# Patient Record
Sex: Female | Born: 2000 | Race: Black or African American | Hispanic: No | Marital: Single | Smoking: Never smoker
Health system: Southern US, Community
[De-identification: ages and names within clinical notes are randomized; demographics above are authoritative.]

## PROBLEM LIST (undated history)

## (undated) ENCOUNTER — Inpatient Hospital Stay (HOSPITAL_COMMUNITY): Payer: Self-pay

## (undated) DIAGNOSIS — K219 Gastro-esophageal reflux disease without esophagitis: Secondary | ICD-10-CM

## (undated) DIAGNOSIS — Z789 Other specified health status: Secondary | ICD-10-CM

## (undated) HISTORY — PX: NO PAST SURGERIES: SHX2092

## (undated) HISTORY — DX: Gastro-esophageal reflux disease without esophagitis: K21.9

---
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
## 2018-01-11 ENCOUNTER — Other Ambulatory Visit: Payer: Self-pay

## 2018-01-11 ENCOUNTER — Encounter: Payer: Self-pay | Admitting: Family Medicine

## 2018-01-11 ENCOUNTER — Other Ambulatory Visit (HOSPITAL_COMMUNITY)
Admission: RE | Admit: 2018-01-11 | Discharge: 2018-01-11 | Disposition: A | Discharge status: Home / Self Care | Payer: Medicaid Other | Source: Ambulatory Visit | Attending: Family Medicine | Admitting: Family Medicine

## 2018-01-11 ENCOUNTER — Ambulatory Visit (INDEPENDENT_AMBULATORY_CARE_PROVIDER_SITE_OTHER): Payer: Self-pay | Admitting: Family Medicine

## 2018-01-11 VITALS — BP 120/80 | HR 69 | Temp 98.0°F | Wt 213.4 lb

## 2018-01-11 DIAGNOSIS — R1030 Lower abdominal pain, unspecified: Secondary | ICD-10-CM

## 2018-01-11 DIAGNOSIS — N926 Irregular menstruation, unspecified: Secondary | ICD-10-CM

## 2018-01-11 LAB — POCT URINE PREGNANCY: Preg Test, Ur: NEGATIVE

## 2018-01-11 NOTE — Patient Instructions (Addendum)
-   Avoid juices juices and sodas  - Try smaller more  - Keep a diary of the food you eat and your symptoms  Write down everything you eat  Then write down the symptoms  It was wonderful to see you today.  Thank you for choosing [facility].   Please call [phone] with any questions about today's appointment.  Please be sure to schedule follow up at the front  desk before you leave today.   Terisa Starr, MD  Family Medicine   Follow up in 4 weeks---we can remove your nexplanon if you would like  Check out [email or web address] for details about the Mirena

## 2018-01-11 NOTE — Progress Notes (Signed)
Patient Name: Kristen Dean Date of Birth: Apr 11, 2000 Date of Visit: 01/11/18 PCP: Freddrick MarchAmin, Yashika, MD  Chief Complaint: nausea   Subjective: Kristen PastelDanielle Dean is a pleasant 18 y.o. year old woman with history of gastroesophageal reflux disease and learning stability presents today for multitude of complaints.  Patient was last seen approximately 6 weeks ago for abdominal discomfort and some irregular bleeding.  Both of these are persistent and addressed again today.  Abdominal Pain  The patient reports 1 to 2-week history of abdominal burning.  Her previous complaints of upper abdominal pain resolved.  She reports she feels throughout her periumbilical area she has had burning.  This occurs 1-2 times a week.  It can last anywhere from hours to days.  It is unassociated with food.  She has no associated dysuria, hematuria, nausea, vomiting.  She does report sensation of heartburn in her mouth at that time.  She has tried an H2 blocker for this without relief.  She reports this is how she felt when she is pregnant.  She has had weight gain since the birth of her child.  She has a Nexplanon for contraception.  She is certain that the Nexplanon is causing this abdominal pain.  Irregular bleeding The patient has had 2 periods since her delivery in September.  She is no longer breast-feeding.  She had her menses in December and she is again on her menses at this time.  She reports her menses are now heavy with the Nexplanon.  Her menses last 5 to 7 days.  She has intermenstrual spotting at times.  Her periods are relatively light.  She changes her pad 3-4 times a day at most.  Her pads are not soaked through.  She denies signs or symptoms of anemia.  She dislikes the spotting with her Nexplanon strongly.   ROS:  ROS As above I have reviewed the patient's medical, surgical, family, and social history as appropriate.   Vitals:   01/11/18 0930  BP: 120/80  Pulse: 69  Temp: 98 F (36.7 C)    SpO2: 98%   Filed Weights   01/11/18 0930  Weight: 213 lb 6.4 oz (96.8 kg)   HEENT: Sclera anicteric. Dentition is moderate. Appears well hydrated. Neck: Supple Cardiac: Regular rate and rhythm. Normal S1/S2. No murmurs, rubs, or gallops appreciated. Lungs: Clear bilaterally to ascultation.  Abdomen: Normoactive bowel sounds. No tenderness to deep or light palpation. No rebound or guarding.  Extremities: Warm, well perfused without edema.  Skin: Warm, dry Psych: Pleasant and appropriate  Declined pelvic today.  Kristen Dean was seen today for still bleeding.  Diagnoses and all orders for this visit:  Irregular menses, discussed at length, recommended trial NSAIDs/OCP. Patient certain she would like Nexplanon removed---discussed at length.  -     POCT urine pregnancy  Lower abdominal pain urine culture wet prep and gonorrhea and Chlamydia testing negative at her last visit.  Differential remains broad.  Will obtain labs below to further evaluate significant weight gain (which argues against Celiac disease).  Celiac disease possible--- although she has had weight gain. Constipation possible- reports [place] 4 stools daily, no pain with defecation IBD- less likely, no hematochezia/melena UTI- negative at last visit Gastritis- possible also although denies NSAID/EtOH Use She has had weight gain- consider thyroid disease  -     Celiac Panel  -     CBC -     Comprehensive metabolic panel -     TSH -     Urine  cytology ancillary only    Terisa Starr, MD  [facility] Medicine Teaching Service

## 2018-01-14 LAB — URINE CYTOLOGY ANCILLARY ONLY
CHLAMYDIA, DNA PROBE: NEGATIVE
Neisseria Gonorrhea: NEGATIVE

## 2018-01-15 LAB — COMPREHENSIVE METABOLIC PANEL
ALT: 19 IU/L (ref 0–32)
AST: 16 IU/L (ref 0–40)
Albumin/Globulin Ratio: 1.4 (ref 1.2–2.2)
Albumin: 4.4 g/dL (ref 3.5–5.5)
Alkaline Phosphatase: 84 IU/L (ref 39–117)
BUN / CREAT RATIO: 17 (ref 9–23)
BUN: 14 mg/dL (ref 6–20)
Bilirubin Total: 0.2 mg/dL (ref 0.0–1.2)
CO2: 19 mmol/L — ABNORMAL LOW (ref 20–29)
Calcium: 9.7 mg/dL (ref 8.7–10.2)
Chloride: 104 mmol/L (ref 96–106)
Creatinine, Ser: 0.84 mg/dL (ref 0.57–1.00)
GFR calc non Af Amer: 101 mL/min/{1.73_m2} (ref >59)
GFR, EST AFRICAN AMERICAN: 117 mL/min/{1.73_m2} (ref >59)
Globulin, Total: 3.2 g/dL (ref 1.5–4.5)
Glucose: 86 mg/dL (ref 65–99)
Potassium: 4.5 mmol/L (ref 3.5–5.2)
Sodium: 138 mmol/L (ref 134–144)
TOTAL PROTEIN: 7.6 g/dL (ref 6.0–8.5)

## 2018-01-15 LAB — CBC
HEMOGLOBIN: 13.1 g/dL (ref 11.1–15.9)
Hematocrit: 39.7 % (ref 34.0–46.6)
MCH: 29.4 pg (ref 26.6–33.0)
MCHC: 33 g/dL (ref 31.5–35.7)
MCV: 89 fL (ref 79–97)
Platelets: 357 10*3/uL (ref 150–450)
RBC: 4.46 x10E6/uL (ref 3.77–5.28)
RDW: 13 % (ref 11.7–15.4)
WBC: 8.2 10*3/uL (ref 3.4–10.8)

## 2018-01-15 LAB — GLIA (IGA/G) + TTG IGA
Antigliadin Abs, IgA: 4 units (ref 0–19)
Gliadin IgG: 4 units (ref 0–19)
Transglutaminase IgA: <2 U/mL (ref 0–3)

## 2018-01-15 LAB — TSH: TSH: 3.55 u[IU]/mL (ref 0.450–4.500)

## 2018-01-18 ENCOUNTER — Ambulatory Visit: Payer: Medicaid Other | Admitting: Family Medicine

## 2018-02-14 ENCOUNTER — Other Ambulatory Visit: Payer: Self-pay

## 2018-02-14 ENCOUNTER — Ambulatory Visit (INDEPENDENT_AMBULATORY_CARE_PROVIDER_SITE_OTHER): Payer: Self-pay | Admitting: Family Medicine

## 2018-02-14 ENCOUNTER — Encounter: Payer: Self-pay | Admitting: Family Medicine

## 2018-02-14 VITALS — BP 100/70 | HR 84 | Temp 98.3°F | Wt 222.0 lb

## 2018-02-14 DIAGNOSIS — Z3046 Encounter for surveillance of implantable subdermal contraceptive: Secondary | ICD-10-CM

## 2018-02-14 DIAGNOSIS — Z30011 Encounter for initial prescription of contraceptive pills: Secondary | ICD-10-CM

## 2018-02-14 MED ORDER — NORGESTIMATE-ETH ESTRADIOL 0.25-35 MG-MCG PO TABS: 1.0000 | ORAL_TABLET | Freq: Every day | ORAL | 11 refills | Status: DC

## 2018-02-14 NOTE — Progress Notes (Signed)
  Patient Name: Kristen Dean Date of Birth: 2000-01-21 Date of Visit: 02/14/18 PCP: Freddrick March, MD  Chief Complaint: Nexplanon removal and new pills   Subjective: Kristen Dean is a pleasant 18 y.o. year old with history significant for obesity presenting for contraception problem. She has had a Nexplanon since September. She has had persistent irregular bleeding---despite use of NSAIDs and OCPs. She has also had weight gain which she does not desire. She is interested in starting pills again. She previously stopped her COCs as she desired pregnancy. Denies personal history of VTE, migraine with aura, or hypertension. She reports a maternal aunt developed a PE in her 11s. The patient herself has previously tolerated and taken OCPs.   She is currently on her period. Pregnancy test negative at last visit, declines pregnancy test today.   ROS:  ROS  As above.  I have reviewed the patient's medical, surgical, family, and social history as appropriate.   Vitals:   02/14/18 0905  BP: 100/70  Pulse: 84  Temp: 98.3 F (36.8 C)  SpO2: 97%   Filed Weights   02/14/18 0905  Weight: 222 lb (100.7 kg)   Cardiac: Warm well perfused.  Capillary refill less than 3 seconds Respiratory breathing comfortably on room air Psych: Pleasant normal affect, appropriate, normal rate of speech  Risks & benefits of Nexplanon removal discussed. Consent form signed.  The patient denies any allergies to anesthetics or antiseptics.  Procedure: Pt was placed in supine position. Left arm was flexed at the elbow and externally rotated so that her wrist was parallel to her ear, The device was palpated and marked. The area was cleaned with alcohol.  1% lidocaine was injected just under the device. The site was cleaned with Betadine. The area surrounding the device was covered with a sterile drape. A scalpel was used to create a small incision. The device was pushed towards the incision. Fibrous  tissue surrounding the device was gradually removed from the device. The device was removed and measured to ensure all 4 cm of device was removed. Steri-strips were used to close the incision. Pressure dressing was applied to the patient.  The patient was instructed to removed the pressure dressing in 24 hrs.  The patient was advised to move slowly from a supine to an upright position  The patient denied any concerns or complaints   Darrian was seen today for nexplanon removal.  Diagnoses and all orders for this visit:  Encounter for initial prescription of contraceptive pills -     norgestimate-ethinyl estradiol (SPRINTEC 28) 0.25-35 MG-MCG tablet; Take 1 tablet by mouth daily. -  Discussed side effects, efficacy, and risks including VTE. Patient has no personal or first degree relative with VTE. We discussed reasons to call and return to care. The patient will follow up in 3 months to check in COCs.   Terisa Starr, MD  Family Medicine Teaching Service

## 2018-02-14 NOTE — Patient Instructions (Signed)
Please leave the wrap on for 24 hours.  Do not shower for 24 hours.  He can shower tomorrow and let the wrap come off along with the strips underneath.  Then place a bandage over it.  If you notice fevers, drainage redness or a lot of pain please let us know you will have some bruising at this site.  Please start your combined hormonal contraceptive pills today.  Let me know if you have side effects.  I recommend taking them with food.  Use backup contraception of a condom for 1 week  It was wonderful to see you today.  Thank you for choosing [facility].   Please call [phone] with any questions about today's appointment.  Please be sure to schedule follow up at the front  desk before you leave today.   Terisa Starr, MD  Family Medicine

## 2018-11-23 IMAGING — US US MFM FETAL BPP W/O NON-STRESS
1 series · 13 of 14 positions shown · non-contrast
Comparison: none

[Series 1: us mfm fetal bpp w/o non-stress · 14 acquisitions, 13 frames shown]
[im 1/14]
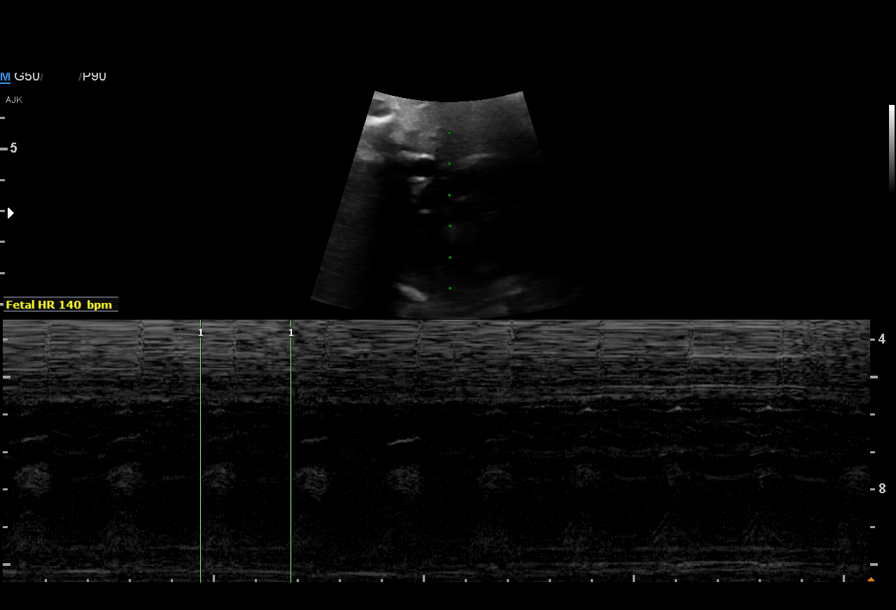
[im 2/14]
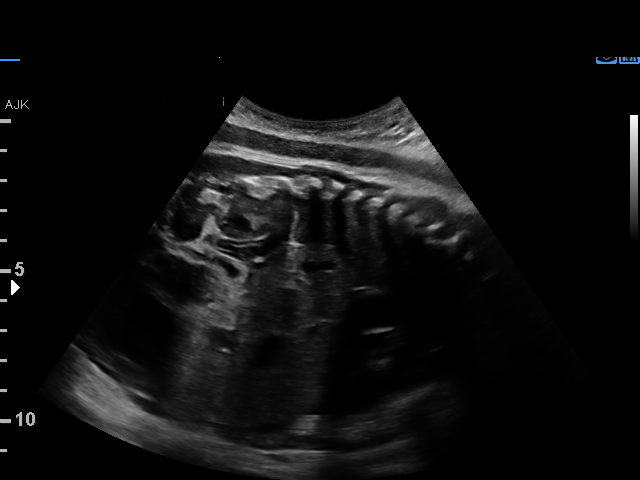
[im 3/14]
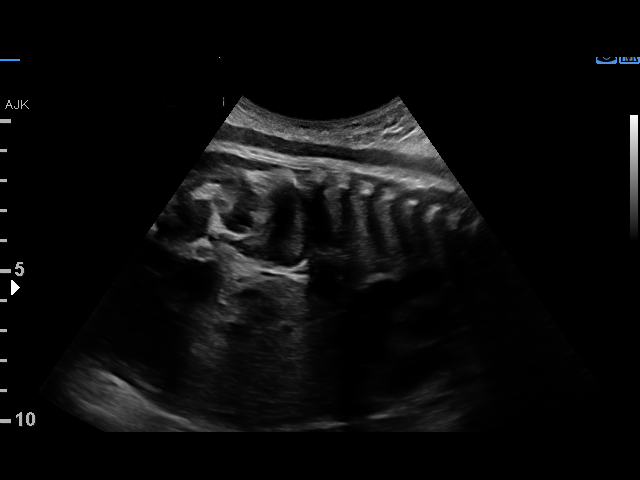
[im 4/14]
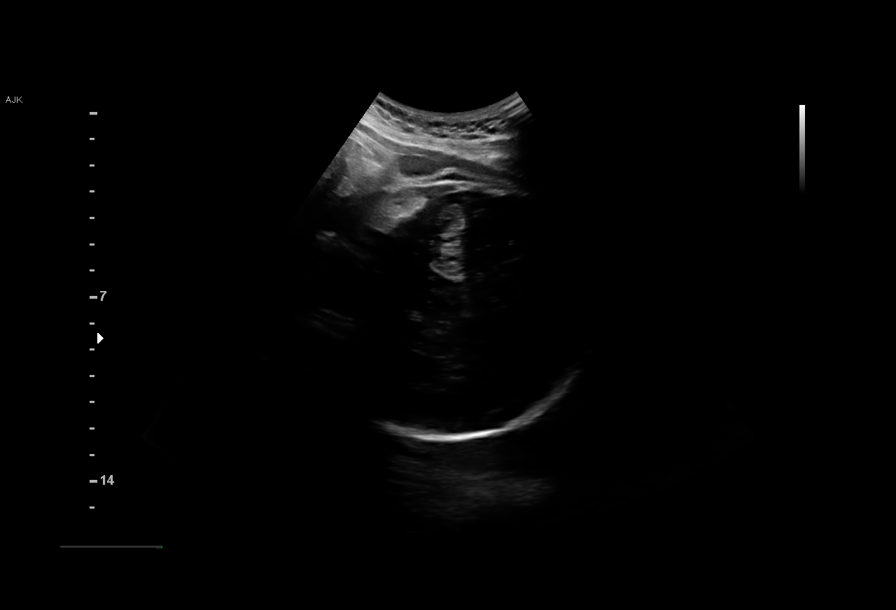
[im 5/14]
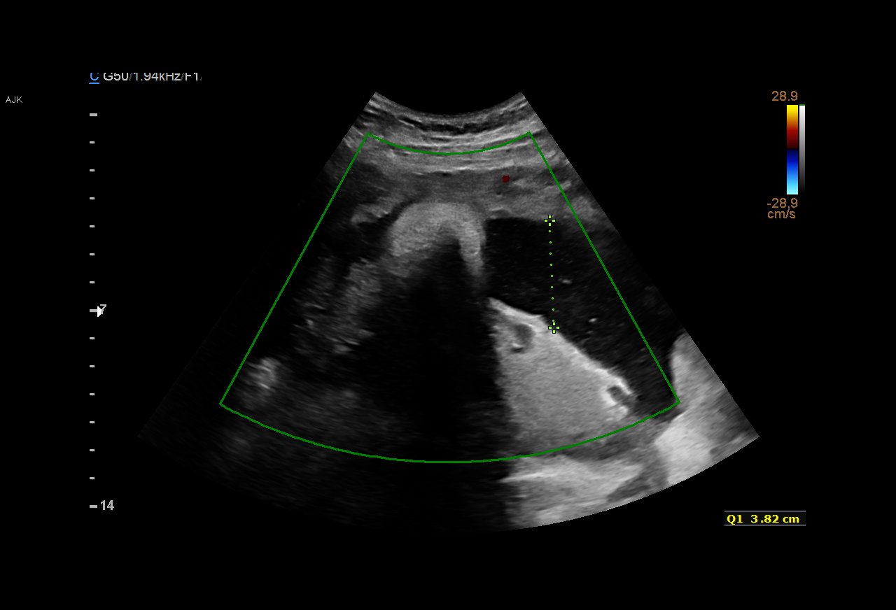
[im 6/14]
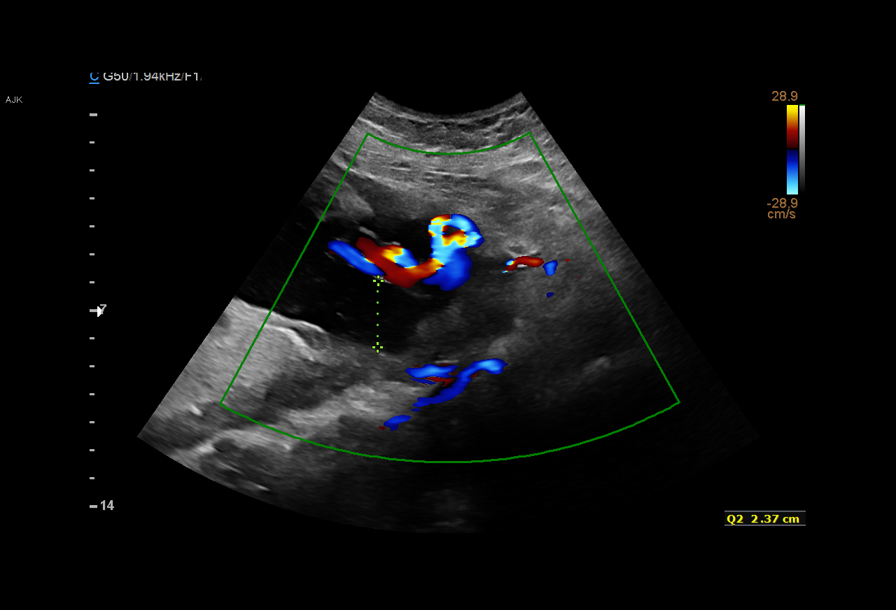
[im 8/14]
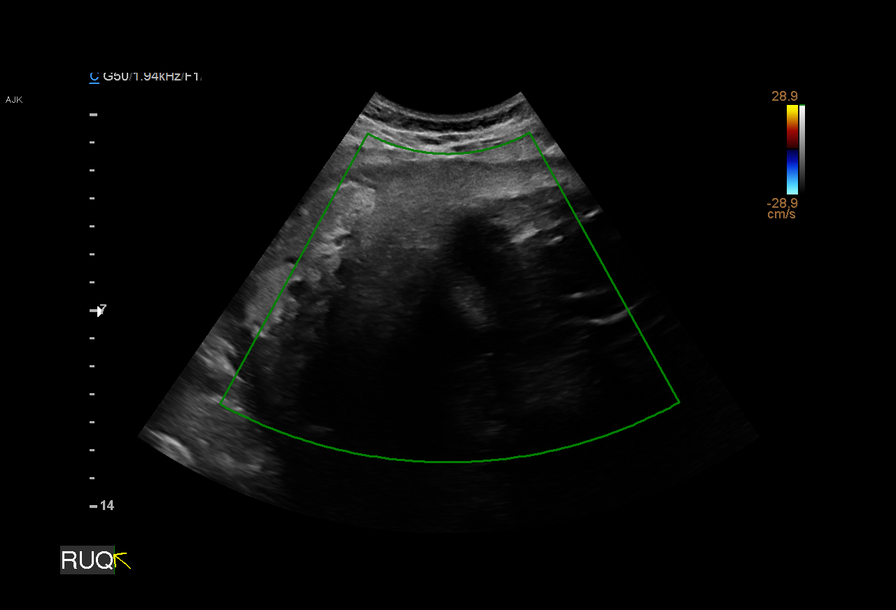
[im 9/14]
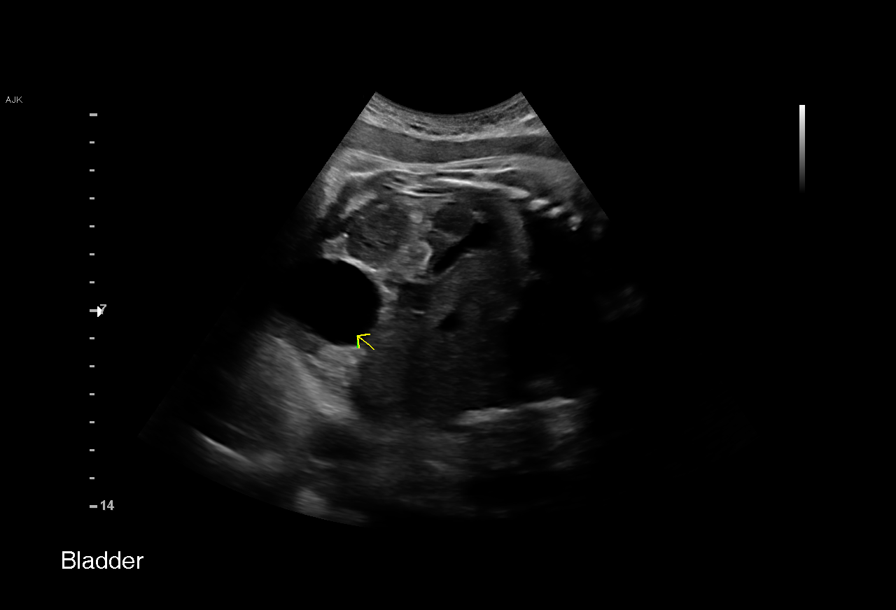
[im 10/14]
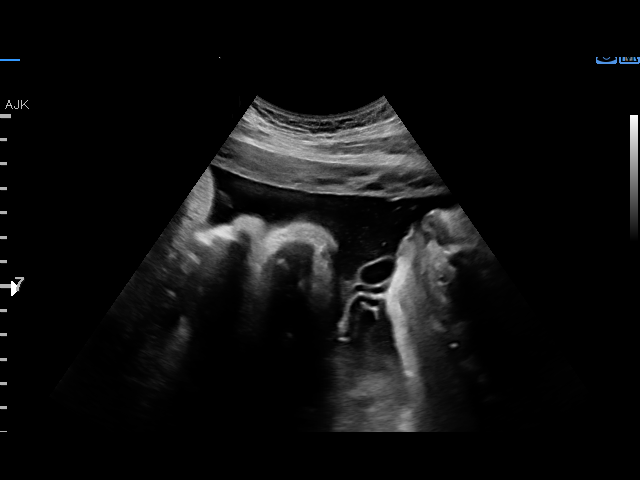
[im 11/14]
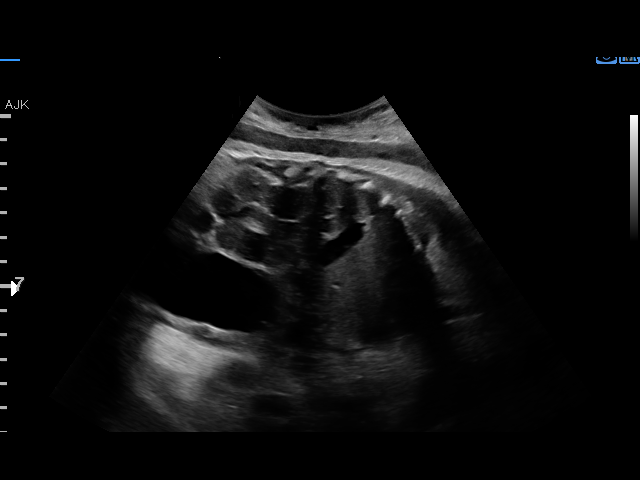
[im 12/14]
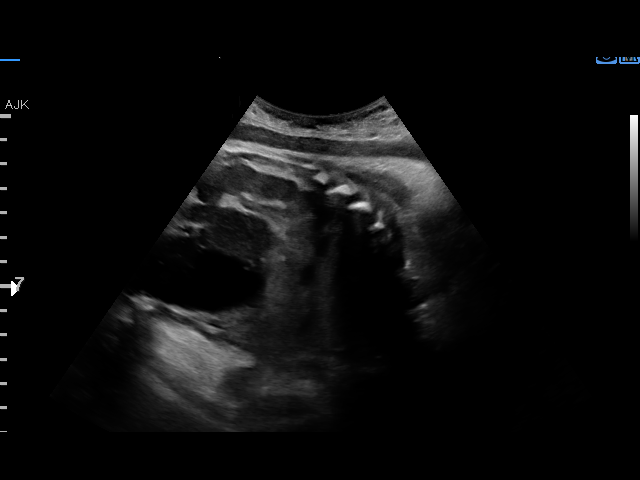
[im 13/14]
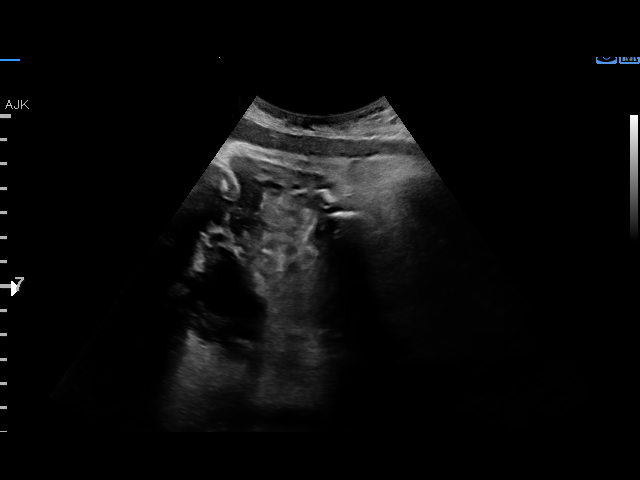
[im 14/14]
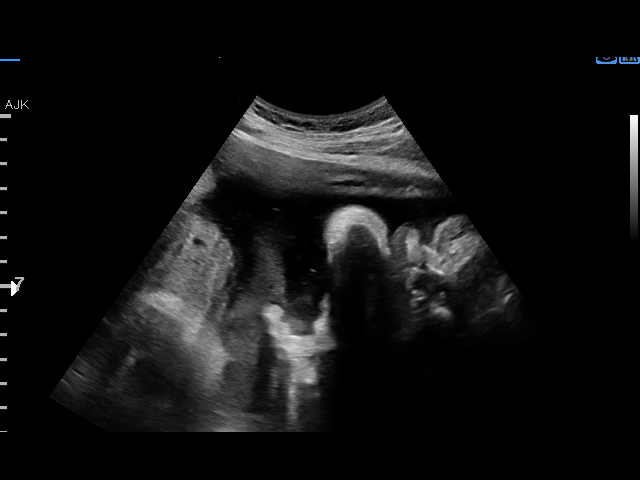

[13 of 14 positions shown; findings below may reference images not displayed]

Attending:        Edo Bunton        Location:         [HOSPITAL]

Indications

Decreased fetal movements, third trimester,
unspecified
38 weeks gestation of pregnancy
Fetal Evaluation

Num Of Fetuses:         1
Fetal Heart Rate(bpm):  140
Cardiac Activity:       Observed
Presentation:           Cephalic

Amniotic Fluid
AFI FV:      Within normal limits

AFI Sum(cm)     %Tile       Largest Pocket(cm)
8.69            16

RUQ(cm)                     LUQ(cm)        LLQ(cm)
3.82

Comment:    Bladder noted.
Biophysical Evaluation

Amniotic F.V:   Within normal limits       F. Tone:        Observed
F. Movement:    Observed                   Score:          [DATE]
F. Breathing:   Observed
OB History

Gravidity:    2         Term:   0        Prem:   0        SAB:   1
TOP:          0       Ectopic:  0        Living: 0
Gestational Age

LMP:           39w 1d        Date:  12/25/16                 EDD:   10/01/17
Best:          38w 3d     Det. By:  Early Ultrasound         EDD:   10/06/17
(02/22/17)
Comments

U/S images reviewed.   No evidence of fetal compromise is
found on BPP today.  No fetal abnormalities are seen.
Recommendations: 1) Close A-P surveillance
Recommendations

Close A-P surveillance

## 2018-12-21 IMAGING — US ULTRASOUND LEFT BREAST LIMITED
1 series · 2 of 2 positions shown · non-contrast
Comparison: None.

CLINICAL DATA: 17-year-old female with a palpable abnormality in
the upper inner left breast that she has been feeling for several
weeks. The patient was previously breast feeding, however has
stopped and states this palpable abnormality may be near resolved.

EXAM:
ULTRASOUND OF THE LEFT BREAST

[Series 1: ultrasound left breast limited · 0.06mm/px · 2 of 2 slices shown]
[im 1/2]
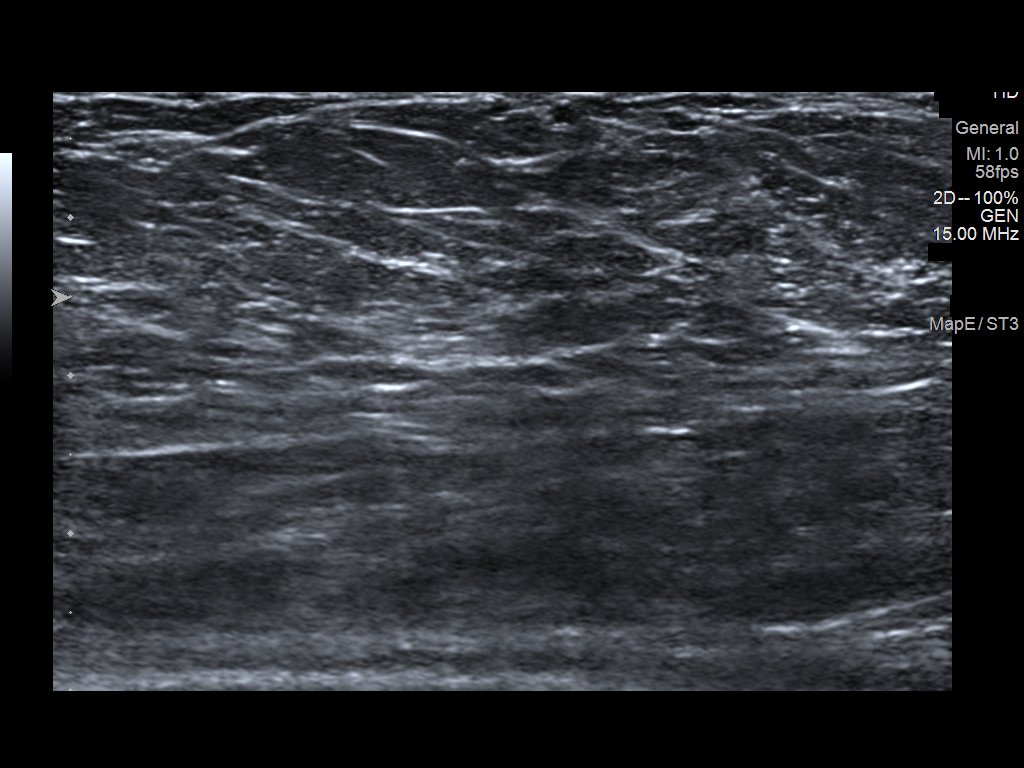
[im 2/2]
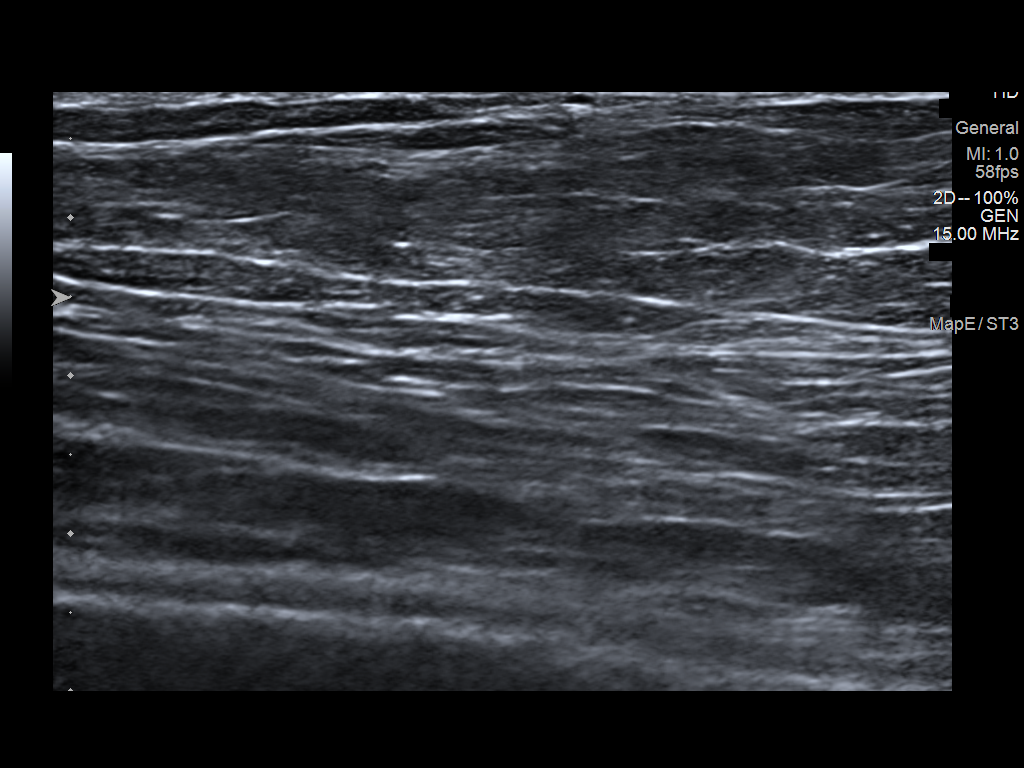

[2 of 2 positions shown; findings below may reference images not displayed]

FINDINGS: Physical examination of the upper inner left breast does not reveal
any palpable masses.

Targeted ultrasound of the left breast was performed. No suspicious
masses or abnormalities seen, only normal fibroglandular tissue
identified.
IMPRESSION: No abnormalities at site of palpable concern in the upper inner left
breast.

RECOMMENDATION:
1. Recommend further management of the left breast palpable
abnormality be based on clinical assessment.

2. Screening mammogram at age 40 unless there are persistent or
intervening clinical concerns. (Code:U9-F-6FU)

I have discussed the findings and recommendations with the patient.
Results were also provided in writing at the conclusion of the
visit. If applicable, a reminder letter will be sent to the patient
regarding the next appointment.

BI-RADS CATEGORY  1: Negative.

## 2018-12-24 ENCOUNTER — Ambulatory Visit (INDEPENDENT_AMBULATORY_CARE_PROVIDER_SITE_OTHER): Payer: Self-pay | Admitting: Family Medicine

## 2018-12-24 ENCOUNTER — Other Ambulatory Visit: Payer: Self-pay

## 2018-12-24 ENCOUNTER — Other Ambulatory Visit (HOSPITAL_COMMUNITY)
Admission: RE | Admit: 2018-12-24 | Discharge: 2018-12-24 | Disposition: A | Discharge status: Home / Self Care | Payer: Medicaid Other | Source: Ambulatory Visit | Attending: Family Medicine | Admitting: Family Medicine

## 2018-12-24 ENCOUNTER — Encounter: Payer: Self-pay | Admitting: Family Medicine

## 2018-12-24 VITALS — BP 124/80 | HR 88 | Wt 226.6 lb

## 2018-12-24 DIAGNOSIS — N939 Abnormal uterine and vaginal bleeding, unspecified: Secondary | ICD-10-CM | POA: Insufficient documentation

## 2018-12-24 DIAGNOSIS — R109 Unspecified abdominal pain: Secondary | ICD-10-CM

## 2018-12-24 LAB — POCT URINALYSIS DIP (MANUAL ENTRY)
Blood, UA: NEGATIVE
Glucose, UA: NEGATIVE mg/dL
Leukocytes, UA: NEGATIVE
Nitrite, UA: NEGATIVE
Protein Ur, POC: 30 mg/dL — AB
Spec Grav, UA: 1.025 (ref 1.010–1.025)
Urobilinogen, UA: 1 E.U./dL
pH, UA: 6 (ref 5.0–8.0)

## 2018-12-24 LAB — POCT WET PREP (WET MOUNT)
Clue Cells Wet Prep Whiff POC: NEGATIVE
Trichomonas Wet Prep HPF POC: ABSENT

## 2018-12-24 LAB — POCT URINE PREGNANCY: Preg Test, Ur: NEGATIVE

## 2018-12-24 NOTE — Progress Notes (Addendum)
Subjective:  Kristen Dean is a 18 y.o. female who presents to the [facility] today with a chief complaint of vaginal bleeding.   HPI:  VAGINAL BLEEDING  Having vaginal bleeding for 2 days, on 12/18 and 12/19 Bleeding is: stopped Sex in last month: 12/17 Possible STD exposure:unlikely, partner recently tested and negative Family history of uterine or vaginal cancer: no  Symptoms Weight loss:no Weight gain:no Trouble with vision: no Headaches: no Dysuria:no Abdomen or pelvic pain: Abdominal pain. Started in the beginning of the month when she had her menses, it is improved. Not associated with eating. Bilateral lower quadrant. No dysuria. Happened before the vaginal bleeding Not associated with N/V/D or F/C.  Back pain: no Genital sores or ulcers:no Pain during sex: yes, once on 12/17, before the bleeding started  ROS see HPI Smoking Status noted  Patient not taking birth control. Declines restarting today. Intermittent condom use.  Menses are regular.  LMP was the first week of December.  Objective:  Physical Exam: BP 124/80   Pulse 88   Wt 226 lb 9.6 oz (102.8 kg)   SpO2 97%   BMI 34.45 kg/m   Gen: NAD, resting comfortably CV: RRR with no murmurs appreciated Pulm: NWOB, CTAB with no crackles, wheezes, or rhonchi GI: Soft, Nontender, Nondistended. GU: no vaginal lesions, no frank blood, no genital ulcers, no CMT, no masses palpated MSK: no edema, cyanosis, or clubbing noted Skin: warm, dry Neuro: grossly normal, moves all extremities Psych: Normal affect and thought content  Chaperone by [place]   Results for orders placed or performed in visit on 12/24/18 (from the past 72 hour(s))  POCT Wet Prep Mellody Drown [place])     Status: None   Collection Time: 12/24/18  2:40 PM  Result Value Ref Range   Source Wet Prep POC VAG    WBC, Wet Prep HPF POC 1-3    Bacteria Wet Prep HPF POC Few Few   Clue Cells Wet Prep HPF POC None None   Clue Cells Wet Prep Whiff POC Negative  Whiff    Yeast Wet Prep HPF POC None None   KOH Wet Prep POC None None   Trichomonas Wet Prep HPF POC Absent Absent  POCT urine pregnancy     Status: None   Collection Time: 12/24/18  3:00 PM  Result Value Ref Range   Preg Test, Ur Negative Negative  POCT urinalysis dipstick     Status: Abnormal   Collection Time: 12/24/18  3:00 PM  Result Value Ref Range   Color, UA yellow yellow   Clarity, UA clear clear   Glucose, UA negative negative mg/dL   Bilirubin, UA small (A) negative   Ketones, POC UA >= (160) (A) negative mg/dL   Spec Grav, UA 3.009 2.330 - 1.025   Blood, UA negative negative   pH, UA 6.0 5.0 - 8.0   Protein Ur, POC =30 (A) negative mg/dL   Urobilinogen, UA 1.0 0.2 or 1.0 E.U./dL   Nitrite, UA Negative Negative   Leukocytes, UA Negative Negative     Assessment/Plan:  Vaginal bleeding Patient presenting with 1 episode of vaginal bleeding that is now resolved.  Given that occurred right after intercourse, likely localized tear.  Although not seen on exam, enough times past that it is likely healed.  However have done work-up to rule out pregnancy, STI, UTI.  Wet prep negative.  Urine pregnancy negative.  GC chlamydia pending.  Patient also requests HIV and RPR which were obtained today  and are pending.  UA was positive for ketones but no hematuria, nitrites, leukocyte esterases. Likely from dehydration.  Patient encouraged to hydrate well.  Can consider repeating in the future if necessary.    Lab Orders     HIV antibody (with reflex)     RPR     POCT Wet Prep [facility])     POCT urine pregnancy     POCT urinalysis dipstick  No orders of the defined types were placed in this encounter.  Precepted with Dr. Blenda Mounts, MD, [place] - PGY3 12/24/2018 4:42 PM

## 2018-12-24 NOTE — Patient Instructions (Addendum)
It was a pleasure to see you today! Thank you for choosing [facility] for your primary care. Kristen Dean was seen for vaginal bleeding.  Your urine pregnancy was negative.  Your urine test did not show infection, but did look like you might be dehydrated. Please drink plenty of fluids and we can recheck in the future. The GC chlamydia labs are still processing.  We also got HIV and syphilis testing today.  We will call you back with any abnormal lab results.  If you develop any worsening abdominal pain or vaginal bleeding please follow-up in the clinic or go to the emergency department.   Best,  Marny Lowenstein, MD, MS FAMILY MEDICINE RESIDENT - PGY3 12/24/2018 2:59 PM

## 2018-12-24 NOTE — Assessment & Plan Note (Addendum)
Patient presenting with 1 episode of vaginal bleeding that is now resolved.  Given that occurred right after intercourse, likely localized tear.  Although not seen on exam, enough times past that it is likely healed.  However have done work-up to rule out pregnancy, STI, UTI.  Wet prep negative.  Urine pregnancy negative.  GC chlamydia pending.  Patient also requests HIV and RPR which were obtained today and are pending.  UA was positive for ketones but no hematuria, nitrites, leukocyte esterases. Likely from dehydration.  Patient encouraged to hydrate well.  Can consider repeating in the future if necessary.

## 2018-12-25 ENCOUNTER — Ambulatory Visit: Payer: Medicaid Other | Admitting: Family Medicine

## 2018-12-25 LAB — RPR: RPR Ser Ql: NONREACTIVE

## 2018-12-25 LAB — CERVICOVAGINAL ANCILLARY ONLY
Chlamydia: NEGATIVE
Comment: NEGATIVE
Comment: NORMAL
Neisseria Gonorrhea: NEGATIVE

## 2018-12-25 LAB — HIV ANTIBODY (ROUTINE TESTING W REFLEX): HIV Screen 4th Generation wRfx: NONREACTIVE

## 2018-12-30 ENCOUNTER — Encounter: Payer: Self-pay | Admitting: *Deleted

## 2019-03-06 ENCOUNTER — Telehealth: Payer: Self-pay | Admitting: Family Medicine

## 2019-03-06 DIAGNOSIS — Z3201 Encounter for pregnancy test, result positive: Secondary | ICD-10-CM

## 2019-03-06 NOTE — Telephone Encounter (Signed)
Patient calls nurse line regarding light spotting, onset yesterday evening. Multiple home pregnancy test with LMP of 12/09/2019. Patient states that spotting has not been enough to be seen on underwear, only on tissue after using restroom. Patient denies any significant cramping or abdominal pain. Patient states that in her first trimester of last pregnancy she thinks she had spotting as well.   ED precautions given to patient.   Pt verbalizes understanding.   Patient scheduled for appointment next Thursday, per patient schedule. Offered earlier appointment for evaluation. However, patient is unable to get off work.   Dr. Manson Passey placed order for [place]. Called and scheduled appointment for 03/13/19 at 8:00am. Attempted to call and inform patient, no answer, left vm to return call to office.   Veronda Prude, RN

## 2019-03-06 NOTE — Telephone Encounter (Signed)
See additional note. One day of small about of blood on tissue paper when wiping. Scheduled for earliest available follow up. Ultrasound ordered for location. No pain, nausea, or vomiting. Multiple + home pregnancy tests.  Ectopic and  Return precautions given.  Terisa Starr, MD  Family Medicine Teaching Service

## 2019-03-06 NOTE — Telephone Encounter (Signed)
Agree with documentation by RN.   Terisa Starr, MD  Family Medicine Teaching Service

## 2019-03-07 ENCOUNTER — Inpatient Hospital Stay (HOSPITAL_COMMUNITY)
Admission: AD | Admit: 2019-03-07 | Discharge: 2019-03-07 | Disposition: A | Discharge status: Home / Self Care | Payer: Medicaid Other | Attending: Obstetrics & Gynecology | Admitting: Obstetrics & Gynecology

## 2019-03-07 ENCOUNTER — Other Ambulatory Visit: Payer: Self-pay

## 2019-03-07 ENCOUNTER — Encounter (HOSPITAL_COMMUNITY): Payer: Self-pay | Admitting: Obstetrics & Gynecology

## 2019-03-07 ENCOUNTER — Inpatient Hospital Stay (HOSPITAL_COMMUNITY): Payer: Medicaid Other

## 2019-03-07 DIAGNOSIS — R109 Unspecified abdominal pain: Secondary | ICD-10-CM

## 2019-03-07 DIAGNOSIS — O209 Hemorrhage in early pregnancy, unspecified: Secondary | ICD-10-CM | POA: Diagnosis not present

## 2019-03-07 DIAGNOSIS — Z3A08 8 weeks gestation of pregnancy: Secondary | ICD-10-CM | POA: Diagnosis not present

## 2019-03-07 DIAGNOSIS — O26891 Other specified pregnancy related conditions, first trimester: Secondary | ICD-10-CM

## 2019-03-07 DIAGNOSIS — Z3490 Encounter for supervision of normal pregnancy, unspecified, unspecified trimester: Secondary | ICD-10-CM

## 2019-03-07 DIAGNOSIS — O26899 Other specified pregnancy related conditions, unspecified trimester: Secondary | ICD-10-CM

## 2019-03-07 DIAGNOSIS — Z3A01 Less than 8 weeks gestation of pregnancy: Secondary | ICD-10-CM

## 2019-03-07 DIAGNOSIS — O26851 Spotting complicating pregnancy, first trimester: Secondary | ICD-10-CM

## 2019-03-07 LAB — URINALYSIS, ROUTINE W REFLEX MICROSCOPIC
Bacteria, UA: NONE SEEN
Bilirubin Urine: NEGATIVE
Glucose, UA: NEGATIVE mg/dL
Hgb urine dipstick: NEGATIVE
Ketones, ur: NEGATIVE mg/dL
Leukocytes,Ua: NEGATIVE
Nitrite: NEGATIVE
Protein, ur: 30 mg/dL — AB
Specific Gravity, Urine: 1.033 — ABNORMAL HIGH (ref 1.005–1.030)
pH: 8 (ref 5.0–8.0)

## 2019-03-07 LAB — CBC
HCT: 39.5 % (ref 36.0–46.0)
Hemoglobin: 13.1 g/dL (ref 12.0–15.0)
MCH: 29.1 pg (ref 26.0–34.0)
MCHC: 33.2 g/dL (ref 30.0–36.0)
MCV: 87.8 fL (ref 80.0–100.0)
Platelets: 361 10*3/uL (ref 150–400)
RBC: 4.5 MIL/uL (ref 3.87–5.11)
RDW: 14.7 % (ref 11.5–15.5)
WBC: 9.7 10*3/uL (ref 4.0–10.5)
nRBC: 0 % (ref 0.0–0.2)

## 2019-03-07 LAB — WET PREP, GENITAL
Sperm: NONE SEEN
Trich, Wet Prep: NONE SEEN
Yeast Wet Prep HPF POC: NONE SEEN

## 2019-03-07 LAB — HCG, QUANTITATIVE, PREGNANCY: hCG, Beta Chain, Quant, S: 3252 m[IU]/mL — ABNORMAL HIGH (ref <5)

## 2019-03-07 LAB — POCT PREGNANCY, URINE: Preg Test, Ur: POSITIVE — AB

## 2019-03-07 MED ORDER — PREPLUS 27-1 MG PO TABS: 1.0000 | ORAL_TABLET | Freq: Every day | ORAL | 13 refills | Status: DC

## 2019-03-07 NOTE — MAU Provider Note (Addendum)
History     CSN: [id number]  Arrival date and time: 03/07/19 1414   First Provider Initiated Contact with Patient 03/07/19 1643      Chief Complaint  Patient presents with  . Vaginal Bleeding  . Possible Pregnancy   HPI Kristen Dean is a 19 y.o. G3P1011 at [redacted]w[redacted]d by LMP who presents to MAU with chief complaints of vaginal spotting and abdominal cramping. These are new problems, onset three days ago. Patient states her symptoms wax and wane throughout the day regardless of activity level. Her pain is never worse than 3/10 and she denies pain upon arrival in MAU. She has not taken medication or tried other treatments for these complaints. She denies dysuria, abdominal tenderness, fever or recent illness. Most recent intercourse last week.  Patient has a New OB appointment with [facility] scheduled for 03/13/2019.  OB History    Gravida  3   Para  1   Term  1   Preterm      AB  1   Living  1     SAB  1   TAB      Ectopic      Multiple  0   Live Births  1           Past Medical History:  Diagnosis Date  . GERD (gastroesophageal reflux disease) 08/08/2017  . Medical history non-contributory     Past Surgical History:  Procedure Laterality Date  . NO PAST SURGERIES      Family History  Problem Relation Age of Onset  . Diabetes Mother   . Diabetes Maternal Aunt   . Diabetes Maternal Grandmother   . Diabetes Sister   . Mental retardation Sister   . Diabetes Maternal Uncle   . Diabetes Maternal Grandfather     Social History   Tobacco Use  . Smoking status: Never Smoker  . Smokeless tobacco: Never Used  Substance Use Topics  . Alcohol use: No  . Drug use: No    Allergies: No Known Allergies  Medications Prior to Admission  Medication Sig Dispense Refill Last Dose  . Prenatal Vit-Fe Fumarate-FA (PREPLUS) 27-1 MG TABS Take 1 tablet by mouth daily. 90 tablet 4 03/07/2019 at 1400  . ibuprofen (ADVIL,MOTRIN) 600 MG tablet Take 1 tablet (600 mg total) by  mouth every 6 (six) hours. 30 tablet 0   . norgestimate-ethinyl estradiol (SPRINTEC 28) 0.25-35 MG-MCG tablet Take 1 tablet by mouth daily. 1 Package 11   . ranitidine (ZANTAC) 150 MG tablet Take 1 tablet (150 mg total) by mouth 2 (two) times daily. 60 tablet 3     Review of Systems  Constitutional: Negative for fever.  Respiratory: Negative for shortness of breath.   Gastrointestinal: Positive for abdominal pain.  Genitourinary: Positive for vaginal bleeding.  Musculoskeletal: Negative for back pain.  All other systems reviewed and are negative.  Physical Exam   Blood pressure 126/70, pulse 81, temperature 98 F (36.7 C), temperature source Oral, resp. rate 18, height 5\' 8"  (1.727 m), weight 98.6 kg, last menstrual period 01/10/2019, SpO2 100 %, unknown if currently breastfeeding.  Physical Exam  Nursing note and vitals reviewed. Constitutional: She is oriented to person, place, and time. She appears well-developed and well-nourished.  Cardiovascular: Normal rate.  Respiratory: Effort normal and breath sounds normal.  GI: Soft. Bowel sounds are normal. She exhibits no distension. There is no abdominal tenderness. There is no rebound, no guarding and no CVA tenderness.  Genitourinary:    Uterus  normal.     Vaginal discharge present.     Genitourinary Comments: Scant blood-tinged mucus visible in vault, removed with fox swab x 1. Vagina pink and well-rugated. Cervix visually closed. No adnexal or CMT on bimanual.   Musculoskeletal:        General: Normal range of motion.  Neurological: She is alert and oriented to person, place, and time.  Skin: Skin is warm and dry.  Psychiatric: She has a normal mood and affect. Her behavior is normal. Judgment and thought content normal.    MAU Course/MDM  Procedures: speculum exam, ultrasound  --Positive cells, no other Amsel criteria on physical exam. Will not treat for BV.  --Greater than 15 minutes at bedside prior to discharge discussing  patient's home monitoring of new bleeding vs scant old blood combined with lubricant used on today's exam. 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 Strongly advised pelvic rest until cleared by her OB Provider  Orders Placed This Encounter  Procedures  . Wet prep, genital  . US OB LESS THAN 14 WEEKS WITH OB TRANSVAGINAL  . Urinalysis, Routine w reflex microscopic  . CBC  . hCG, quantitative, pregnancy  . Pregnancy, urine POC  . Discharge patient   Patient Vitals for the past 24 hrs:  BP Temp Temp src Pulse Resp SpO2 Height Weight  03/07/19 1810 131/74 98.5 F (36.9 C) Oral 77 18 100 % -- --  03/07/19 1640 -- -- -- -- -- 100 % -- --  03/07/19 1639 126/70 98 F (36.7 C) Oral 81 18 -- -- --  03/07/19 1446 126/61 98.4 F (36.9 C) Oral 70 18 100 % 5\' 8"  (1.727 m) 98.6 kg   Results for orders placed or performed during the hospital encounter of 03/07/19 (from the past 24 hour(s))  Pregnancy, urine POC     Status: Abnormal   Collection Time: 03/07/19  3:17 PM  Result Value Ref Range   Preg Test, Ur POSITIVE (A) NEGATIVE  Urinalysis, Routine w reflex microscopic     Status: Abnormal   Collection Time: 03/07/19  3:30 PM  Result Value Ref Range   Color, Urine YELLOW YELLOW   APPearance HAZY (A) CLEAR   Specific Gravity, Urine 1.033 (H) 1.005 - 1.030   pH 8.0 5.0 - 8.0   Glucose, UA NEGATIVE NEGATIVE mg/dL   Hgb urine dipstick NEGATIVE NEGATIVE   Bilirubin Urine NEGATIVE NEGATIVE   Ketones, ur NEGATIVE NEGATIVE mg/dL   Protein, ur 30 (A) NEGATIVE mg/dL   Nitrite NEGATIVE NEGATIVE   Leukocytes,Ua NEGATIVE NEGATIVE   RBC / HPF 0-5 0 - 5 RBC/hpf   WBC, UA 0-5 0 - 5 WBC/hpf   Bacteria, UA NONE SEEN NONE SEEN   Squamous Epithelial / LPF 6-10 0 - 5   Mucus PRESENT   CBC     Status: None   Collection Time: 03/07/19  4:33 PM  Result Value Ref Range   WBC 9.7 4.0 - 10.5 K/uL   RBC 4.50 3.87 - 5.11 MIL/uL    Hemoglobin 13.1 12.0 - 15.0 g/dL   HCT 05/07/19 63.1 - 49.7 %   MCV 87.8 80.0 - 100.0 fL   MCH 29.1 26.0 - 34.0 pg   MCHC 33.2 30.0 - 36.0 g/dL   RDW 02.6 37.8 - 58.8 %   Platelets 361 150 - 400 K/uL   nRBC 0.0 0.0 - 0.2 %  hCG, quantitative, pregnancy  Status: Abnormal   Collection Time: 03/07/19  4:33 PM  Result Value Ref Range   hCG, Beta Chain, Quant, S 3,252 (H) <5 mIU/mL  Wet prep, genital     Status: Abnormal   Collection Time: 03/07/19  4:55 PM   Specimen: PATH Cytology Cervicovaginal Ancillary Only  Result Value Ref Range   Yeast Wet Prep HPF POC NONE SEEN NONE SEEN   Trich, Wet Prep NONE SEEN NONE SEEN   Clue Cells Wet Prep HPF POC PRESENT (A) NONE SEEN   WBC, Wet Prep HPF POC MANY (A) NONE SEEN   Sperm NONE SEEN    US OB LESS THAN 14 WEEKS WITH OB TRANSVAGINAL  Result Date: 03/07/2019 CLINICAL DATA:  Pelvic pain, cramping, and vaginal bleeding in 1st trimester pregnancy. EXAM: OBSTETRIC <14 WK [place] AND TRANSVAGINAL OB US TECHNIQUE: Both transabdominal and transvaginal ultrasound examinations were performed for complete evaluation of the gestation as well as the maternal uterus, adnexal regions, and pelvic cul-de-sac. Transvaginal technique was performed to assess early pregnancy. COMPARISON:  None. FINDINGS: Intrauterine gestational sac: Single Yolk sac:  Visualized. Embryo:  Visualized. Cardiac Activity: Visualized. Heart Rate: 82 bpm CRL:  1.6 mm - below reference range for dating Subchorionic hemorrhage:  None visualized. Maternal uterus/adnexae: Retroverted uterus. Normal appearance of both ovaries. No mass or abnormal free fluid identified. IMPRESSION: Single living IUP at approximately [redacted] weeks gestational age, although crown-rump length is too small for dating. Consider followup ultrasound to assess dating and confirm viability in 1 week. Electronically Signed   By: Danae Orleans M.D.   On: 03/07/2019 17:39    Meds ordered this encounter  Medications  . Prenatal Vit-Fe  Fumarate-FA (PREPLUS) 27-1 MG TABS    Sig: Take 1 tablet by mouth daily.    Dispense:  30 tablet    Refill:  13    Order Specific Question:   Supervising Provider    Answer:   Jaynie Collins A [[id number]]    Assessment and Plan  --19 y.o. [id number] with SIUP at [redacted]w[redacted]d by LMP --Fetus too small for CRL per Radiology report --Discharge home in stable condition with bleeding precautions  F/U: --New OB [facility] 03/13/2019  Calvert Cantor, CNM 03/07/2019, 6:27 PM

## 2019-03-07 NOTE — Discharge Instructions (Signed)
First Trimester of Pregnancy  The first trimester of pregnancy is from week 1 until the end of week 13 (months 1 through 3). During this time, your baby will begin to develop inside you. At 6-8 weeks, the eyes and face are formed, and the heartbeat can be seen on ultrasound. At the end of 12 weeks, all the baby's organs are formed. Prenatal care is all the medical care you receive before the birth of your baby. Make sure you get good prenatal care and follow all of your doctor's instructions. Follow these instructions at home: Medicines  Take over-the-counter and prescription medicines only as told by your doctor. Some medicines are safe and some medicines are not safe during pregnancy.  Take a prenatal vitamin that contains at least 600 micrograms (mcg) of folic acid.  If you have trouble pooping (constipation), take medicine that will make your stool soft (stool softener) if your doctor approves. Eating and drinking   Eat regular, healthy meals.  Your doctor will tell you the amount of weight gain that is right for you.  Avoid raw meat and uncooked cheese.  If you feel sick to your stomach (nauseous) or throw up (vomit): ? Eat 4 or 5 small meals a day instead of 3 large meals. ? Try eating a few soda crackers. ? Drink liquids between meals instead of during meals.  To prevent constipation: ? Eat foods that are high in fiber, like fresh fruits and vegetables, whole grains, and beans. ? Drink enough fluids to keep your pee (urine) clear or pale yellow. Activity  Exercise only as told by your doctor. Stop exercising if you have cramps or pain in your lower belly (abdomen) or low back.  Do not exercise if it is too hot, too humid, or if you are in a place of great height (high altitude).  Try to avoid standing for long periods of time. Move your legs often if you must stand in one place for a long time.  Avoid heavy lifting.  Wear low-heeled shoes. Sit and stand up  straight.  You can have sex unless your doctor tells you not to. Relieving pain and discomfort  Wear a good support bra if your breasts are sore.  Take warm water baths (sitz baths) to soothe pain or discomfort caused by hemorrhoids. Use hemorrhoid cream if your doctor says it is okay.  Rest with your legs raised if you have leg cramps or low back pain.  If you have puffy, bulging veins (varicose veins) in your legs: ? Wear support hose or compression stockings as told by your doctor. ? Raise (elevate) your feet for 15 minutes, 3-4 times a day. ? Limit salt in your food. Prenatal care  Schedule your prenatal visits by the twelfth week of pregnancy.  Write down your questions. Take them to your prenatal visits.  Keep all your prenatal visits as told by your doctor. This is important. Safety  Wear your seat belt at all times when driving.  Make a list of emergency phone numbers. The list should include numbers for family, friends, the hospital, and police and fire departments. General instructions  Ask your doctor for a referral to a local prenatal class. Begin classes no later than at the start of month 6 of your pregnancy.  Ask for help if you need counseling or if you need help with nutrition. Your doctor can give you advice or tell you where to go for help.  Do not use hot tubs, steam   rooms, or saunas.  Do not douche or use tampons or scented sanitary pads.  Do not cross your legs for long periods of time.  Avoid all herbs and alcohol. Avoid drugs that are not approved by your doctor.  Do not use any tobacco products, including cigarettes, chewing tobacco, and electronic cigarettes. If you need help quitting, ask your doctor. You may get counseling or other support to help you quit.  Avoid cat litter boxes and soil used by cats. These carry germs that can cause birth defects in the baby and can cause a loss of your baby (miscarriage) or stillbirth.  Visit your dentist.  At home, brush your teeth with a soft toothbrush. Be gentle when you floss. Contact a doctor if:  You are dizzy.  You have mild cramps or pressure in your lower belly.  You have a nagging pain in your belly area.  You continue to feel sick to your stomach, you throw up, or you have watery poop (diarrhea).  You have a bad smelling fluid coming from your vagina.  You have pain when you pee (urinate).  You have increased puffiness (swelling) in your face, hands, legs, or ankles. Get help right away if:  You have a fever.  You are leaking fluid from your vagina.  You have spotting or bleeding from your vagina.  You have very bad belly cramping or pain.  You gain or lose weight rapidly.  You throw up blood. It may look like coffee grounds.  You are around people who have German measles, fifth disease, or chickenpox.  You have a very bad headache.  You have shortness of breath.  You have any kind of trauma, such as from a fall or a car accident. Summary  The first trimester of pregnancy is from week 1 until the end of week 13 (months 1 through 3).  To take care of yourself and your unborn baby, you will need to eat healthy meals, take medicines only if your doctor tells you to do so, and do activities that are safe for you and your baby.  Keep all follow-up visits as told by your doctor. This is important as your doctor will have to ensure that your baby is healthy and growing well. This information is not intended to replace advice given to you by your health care provider. Make sure you discuss any questions you have with your health care provider. Document Revised: 04/11/2018 Document Reviewed: 12/28/2015 Elsevier Patient Education  2020 [employer]  Safe Medications in Pregnancy   Acne: Benzoyl Peroxide Salicylic Acid  Backache/Headache: Tylenol: 2 regular strength every 4 hours OR              2 Extra strength every 6  hours  Colds/Coughs/Allergies: Benadryl (alcohol free) 25 mg every 6 hours as needed Breath right strips Claritin Cepacol throat lozenges Chloraseptic throat spray Cold-Eeze- up to three times per day Cough drops, alcohol free Flonase (by prescription only) Guaifenesin Mucinex Robitussin DM (plain only, alcohol free) Saline nasal spray/drops Sudafed (pseudoephedrine) & Actifed ** use only after [redacted] weeks gestation and if you do not have high blood pressure Tylenol Vicks Vaporub Zinc lozenges Zyrtec   Constipation: Colace Ducolax suppositories Fleet enema Glycerin suppositories Metamucil Milk of magnesia Miralax Senokot Smooth move tea  Diarrhea: Kaopectate Imodium A-D  *NO pepto Bismol  Hemorrhoids: Anusol Anusol HC Preparation H Tucks  Indigestion: Tums Maalox Mylanta Zantac  Pepcid  Insomnia: Benadryl (alcohol free) 25mg every 6 hours as needed   Tylenol PM Unisom, no Gelcaps  Leg Cramps: Tums MagGel  Nausea/Vomiting:  Bonine Dramamine Emetrol Ginger extract Sea bands Meclizine  Nausea medication to take during pregnancy:  Unisom (doxylamine succinate 25 mg tablets) Take one tablet daily at bedtime. If symptoms are not adequately controlled, the dose can be increased to a maximum recommended dose of two tablets daily (1/2 tablet in the morning, 1/2 tablet mid-afternoon and one at bedtime). Vitamin B6 100mg tablets. Take one tablet twice a day (up to 200 mg per day).  Skin Rashes: Aveeno products Benadryl cream or 25mg every 6 hours as needed Calamine Lotion 1% cortisone cream  Yeast infection: Gyne-lotrimin 7 Monistat 7   **If taking multiple medications, please check labels to avoid duplicating the same active ingredients **take medication as directed on the label ** Do not exceed 4000 mg of tylenol in 24 hours **Do not take medications that contain aspirin or ibuprofen   

## 2019-03-07 NOTE — MAU Note (Signed)
Is spotting, started 3 days ago.  Took 4 HPT, first was light a month ago. Then a wk later did a digital one, it said yes.  "no pain, just a little cramping, but it is normal."

## 2019-03-08 ENCOUNTER — Other Ambulatory Visit: Payer: Self-pay

## 2019-03-08 ENCOUNTER — Inpatient Hospital Stay (HOSPITAL_COMMUNITY)
Admission: AD | Admit: 2019-03-08 | Discharge: 2019-03-08 | Disposition: A | Discharge status: Home / Self Care | Payer: Medicaid Other | Attending: Family Medicine | Admitting: Family Medicine

## 2019-03-08 ENCOUNTER — Inpatient Hospital Stay (HOSPITAL_COMMUNITY): Payer: Medicaid Other

## 2019-03-08 DIAGNOSIS — Z3A01 Less than 8 weeks gestation of pregnancy: Secondary | ICD-10-CM | POA: Insufficient documentation

## 2019-03-08 DIAGNOSIS — O209 Hemorrhage in early pregnancy, unspecified: Secondary | ICD-10-CM | POA: Diagnosis present

## 2019-03-08 DIAGNOSIS — O039 Complete or unspecified spontaneous abortion without complication: Secondary | ICD-10-CM | POA: Diagnosis not present

## 2019-03-08 LAB — URINALYSIS, ROUTINE W REFLEX MICROSCOPIC
Bacteria, UA: NONE SEEN
Bilirubin Urine: NEGATIVE
Glucose, UA: NEGATIVE mg/dL
Ketones, ur: 5 mg/dL — AB
Nitrite: NEGATIVE
Protein, ur: 100 mg/dL — AB
RBC / HPF: >50 RBC/hpf — ABNORMAL HIGH (ref 0–5)
Specific Gravity, Urine: 1.029 (ref 1.005–1.030)
pH: 6 (ref 5.0–8.0)

## 2019-03-08 NOTE — MAU Note (Signed)
Kristen Dean is a 19 y.o. at [redacted]w[redacted]d here in MAU reporting:  +vaginal bleeding States was seen yesterday for the bleeding. Picked up after her ultrasound yesterday. Endorses that she started "clotting" so the on call told her to come in.  Pain score: denies Vitals:   03/08/19 1059  BP: 123/77  Pulse: 84  Resp: 18  SpO2: 100%    Lab orders placed from triage: ua

## 2019-03-08 NOTE — Discharge Instructions (Signed)

## 2019-03-08 NOTE — MAU Provider Note (Signed)
History     CSN: [id number]  Arrival date and time: 03/08/19 1024   First Provider Initiated Contact with Patient 03/08/19 1137      Chief Complaint  Patient presents with  . Vaginal Bleeding   HPI Kristen Dean is a 19 y.o. G3P1011 at [redacted]w[redacted]d who presents with bright red vaginal bleeding. She states she passed several clots at home and is having spotting now. She denies any pain. She was seen in MAU yesterday and u/s showed a small IUP with a heartbeat.   OB History    Gravida  3   Para  1   Term  1   Preterm      AB  1   Living  1     SAB  1   TAB      Ectopic      Multiple  0   Live Births  1           Past Medical History:  Diagnosis Date  . GERD (gastroesophageal reflux disease) 08/08/2017  . Medical history non-contributory     Past Surgical History:  Procedure Laterality Date  . NO PAST SURGERIES      Family History  Problem Relation Age of Onset  . Diabetes Mother   . Diabetes Maternal Aunt   . Diabetes Maternal Grandmother   . Diabetes Sister   . Mental retardation Sister   . Diabetes Maternal Uncle   . Diabetes Maternal Grandfather     Social History   Tobacco Use  . Smoking status: Never Smoker  . Smokeless tobacco: Never Used  Substance Use Topics  . Alcohol use: No  . Drug use: No    Allergies: No Known Allergies  Medications Prior to Admission  Medication Sig Dispense Refill Last Dose  . Prenatal Vit-Fe Fumarate-FA (PREPLUS) 27-1 MG TABS Take 1 tablet by mouth daily. 30 tablet 13   . ranitidine (ZANTAC) 150 MG tablet Take 1 tablet (150 mg total) by mouth 2 (two) times daily. 60 tablet 3     Review of Systems  Constitutional: Negative.  Negative for fatigue and fever.  HENT: Negative.   Respiratory: Negative.  Negative for shortness of breath.   Cardiovascular: Negative.  Negative for chest pain.  Gastrointestinal: Negative.  Negative for abdominal pain, constipation, diarrhea, nausea and vomiting.  Genitourinary:  Positive for vaginal bleeding. Negative for dysuria.  Neurological: Negative.  Negative for dizziness and headaches.   Physical Exam   Blood pressure 123/77, pulse 84, temperature 97.8 F (36.6 C), temperature source Oral, resp. rate 18, last menstrual period 01/10/2019, SpO2 100 %, unknown if currently breastfeeding.  Physical Exam  Nursing note and vitals reviewed. Constitutional: She is oriented to person, place, and time. She appears well-developed and well-nourished. No distress.  HENT:  Head: Normocephalic.  Eyes: Pupils are equal, round, and reactive to light.  Cardiovascular: Normal rate, regular rhythm and normal heart sounds.  Respiratory: Effort normal and breath sounds normal. No respiratory distress.  GI: Soft. Bowel sounds are normal. She exhibits no distension. There is no abdominal tenderness.  Genitourinary:    Genitourinary Comments: Small amount of bright red bleeding in vault.    Neurological: She is alert and oriented to person, place, and time.  Skin: Skin is warm and dry.  Psychiatric: She has a normal mood and affect. Her behavior is normal. Judgment and thought content normal.    MAU Course  Procedures Results for orders placed or performed during the hospital encounter of  03/08/19 (from the past 24 hour(s))  Urinalysis, Routine w reflex microscopic     Status: Abnormal   Collection Time: 03/08/19 11:50 AM  Result Value Ref Range   Color, Urine RED (A) YELLOW   APPearance HAZY (A) CLEAR   Specific Gravity, Urine 1.029 1.005 - 1.030   pH 6.0 5.0 - 8.0   Glucose, UA NEGATIVE NEGATIVE mg/dL   Hgb urine dipstick LARGE (A) NEGATIVE   Bilirubin Urine NEGATIVE NEGATIVE   Ketones, ur 5 (A) NEGATIVE mg/dL   Protein, ur 100 (A) NEGATIVE mg/dL   Nitrite NEGATIVE NEGATIVE   Leukocytes,Ua TRACE (A) NEGATIVE   RBC / HPF >50 (H) 0 - 5 RBC/hpf   WBC, UA 6-10 0 - 5 WBC/hpf   Bacteria, UA NONE SEEN NONE SEEN   Squamous Epithelial / LPF 11-20 0 - 5   Mucus PRESENT     US OB Transvaginal  Result Date: 03/08/2019 CLINICAL DATA:  Vaginal bleeding. Eight weeks and 1 day pregnant by last menstrual period. EXAM: TRANSVAGINAL OB ULTRASOUND TECHNIQUE: Transvaginal ultrasound was performed for complete evaluation of the gestation as well as the maternal uterus, adnexal regions, and pelvic cul-de-sac. COMPARISON:  03/07/2019 FINDINGS: Intrauterine gestational sac: Not visualized Yolk sac:  Not visualized Embryo:  Not visualized Cardiac Activity: Not visualized Maternal uterus/adnexae: Normal appearing uterus, endometrium and ovaries. No free peritoneal fluid. IMPRESSION: Interval spontaneous abortion with expulsion of the previously demonstrated intrauterine pregnancy. Electronically Signed   By: Claudie Revering M.D.   On: 03/08/2019 13:37   US OB LESS THAN 14 WEEKS WITH OB TRANSVAGINAL  Result Date: 03/07/2019 CLINICAL DATA:  Pelvic pain, cramping, and vaginal bleeding in 1st trimester pregnancy. EXAM: OBSTETRIC <14 WK [place] AND TRANSVAGINAL OB US TECHNIQUE: Both transabdominal and transvaginal ultrasound examinations were performed for complete evaluation of the gestation as well as the maternal uterus, adnexal regions, and pelvic cul-de-sac. Transvaginal technique was performed to assess early pregnancy. COMPARISON:  None. FINDINGS: Intrauterine gestational sac: Single Yolk sac:  Visualized. Embryo:  Visualized. Cardiac Activity: Visualized. Heart Rate: 82 bpm CRL:  1.6 mm - below reference range for dating Subchorionic hemorrhage:  None visualized. Maternal uterus/adnexae: Retroverted uterus. Normal appearance of both ovaries. No mass or abnormal free fluid identified. IMPRESSION: Single living IUP at approximately [redacted] weeks gestational age, although crown-rump length is too small for dating. Consider followup ultrasound to assess dating and confirm viability in 1 week. Electronically Signed   By: Marlaine Hind M.D.   On: 03/07/2019 17:39    MDM UA US OB Transvaginal  Assessment  and Plan   1. Miscarriage   2. [redacted] weeks gestation of pregnancy    -Discharge home in stable condition -Bleeding and pain precautions discussed -Patient advised to follow-up with [facility] in 1 week for blood work and 2 weeks with a provider, message sent.  -Patient may return to MAU as needed or if her condition were to change or worsen    [place] 03/08/2019, 11:37 AM

## 2019-03-10 ENCOUNTER — Telehealth: Payer: Self-pay | Admitting: Obstetrics and Gynecology

## 2019-03-10 LAB — GC/CHLAMYDIA PROBE AMP (~~LOC~~) NOT AT ARMC
Chlamydia: NEGATIVE
Comment: NEGATIVE
Comment: NORMAL
Neisseria Gonorrhea: NEGATIVE

## 2019-03-10 NOTE — Telephone Encounter (Signed)
Called the patient to confirm the upcoming appointment request. Left a detailed voicemail message with the appointment info and also advised patient please cal our office if the time and date is inconvenient.

## 2019-03-12 ENCOUNTER — Encounter: Payer: Self-pay | Admitting: Family Medicine

## 2019-03-12 NOTE — Progress Notes (Signed)
    SUBJECTIVE:   CHIEF COMPLAINT / HPI: Blood work for pregnancy test, sugars  Had recent miscarriage: Initially patient with presenting today to check blood work for pregnancy tests; however, over the weekend she unfortunately had a miscarriage.  She reports she would like to have labs drawn/repeat pregnancy test.  She reports that emotionally she is handling the miscarriage well, is not overly sad or distraught.  Does not have any additional questions or concerns at this time.  Reports she continues to have some bleeding, but it is almost gone and is decreased from initial amounts.  PERTINENT  PMH / PSH:  Obesity BMI 33   OBJECTIVE:   There were no vitals taken for this visit.   Physical Exam: General: No apparent distress, nontoxic-appearing Cardiac: RRR, S1-S2 present, no murmurs appreciated Resp: CTA bilaterally, normal work of breathing Abdomen: Normal bowel sounds appreciated, nontender to palpation, nondistended   ASSESSMENT/PLAN:   Miscarriage Patient recently had miscarriage on 03/08/2019.  Continues to have some vaginal bleeding, but this is improving. -Checking quantitative beta-hCG at today's visit -Follow-up in 1 week for repeat pregnancy test    Dollene Cleveland, DO [facility]

## 2019-03-13 ENCOUNTER — Other Ambulatory Visit: Payer: Self-pay

## 2019-03-13 ENCOUNTER — Encounter: Payer: Self-pay | Admitting: Family Medicine

## 2019-03-13 ENCOUNTER — Ambulatory Visit (HOSPITAL_COMMUNITY): Payer: Self-pay

## 2019-03-13 ENCOUNTER — Ambulatory Visit (INDEPENDENT_AMBULATORY_CARE_PROVIDER_SITE_OTHER): Payer: Self-pay | Admitting: Family Medicine

## 2019-03-13 DIAGNOSIS — O039 Complete or unspecified spontaneous abortion without complication: Secondary | ICD-10-CM

## 2019-03-14 ENCOUNTER — Telehealth: Payer: Self-pay | Admitting: Family Medicine

## 2019-03-14 LAB — BETA HCG QUANT (REF LAB): hCG Quant: 32 m[IU]/mL

## 2019-03-14 NOTE — Telephone Encounter (Signed)
Patient returning [facility] call.

## 2019-03-14 NOTE — Telephone Encounter (Signed)
Patient says she has an alternate number to call [phone]. This is a home number but do not leave a voicemail.  Her cell phone [phone] - a detailed VM can be left.

## 2019-03-14 NOTE — Telephone Encounter (Signed)
Pt is calling to see how soon she can have sex after miscarriage. Please call and advise pt. Thanks

## 2019-03-14 NOTE — Telephone Encounter (Signed)
Contacted patient via alternate phone number as patient's phone was not receiving incoming phone calls. Patient advised to abstain from intercourse until negative pregnancy test in our lab. Appointment scheduled for 3/18 AM to recheck bhcg level. Questions were answered.

## 2019-03-14 NOTE — Telephone Encounter (Signed)
Patient contacted via telephone with no answer. Voicemail left with instructions to call clinic when she is available.

## 2019-03-17 ENCOUNTER — Other Ambulatory Visit: Payer: Medicaid Other

## 2019-03-19 NOTE — Assessment & Plan Note (Signed)
Patient recently had miscarriage on 03/08/2019.  Continues to have some vaginal bleeding, but this is improving. -Checking quantitative beta-hCG at today's visit -Follow-up in 1 week for repeat pregnancy test

## 2019-03-19 NOTE — Progress Notes (Signed)
   Subjective:   Patient ID: Kristen Dean    DOB: Aug 13, 2000, 19 y.o. female   MRN: [id number]  Kristen Dean is a 19 y.o. female with a history of GERD, learning disability, and Vitamin D deficiency here for follow up after miscarriage.  Miscarriage: Patient is a [id number]. She was ~[redacted] weeks pregnant when she had a spontaneous miscarriage on 03/08/19. Was was evaluated by the MAU for bright red vaginal bleeding. Ultrasound on 3/5 was notable for Single living IUP at approximately [redacted] weeks gestational age, although crown-rump length is too small for dating. Repeat Ultrasound on 3/6 was was unable to visualize any gestational sac, embryo, cardiac activity consistent with Interval spontaneous abortion with expulsion of the previously demonstrated intrauterine pregnancy.  B-HCG on 3/5 was 3252. Repeat B-HCG on 3/11 was 32. Patient notes bleeding stopped about 1 week ago.  Denies any cramps or discharge.  She has not been sexually active as she was advised not to until further notice.  She is interested in birth control either the patch or the pill.  She has opted for the pill.  She denies any history of migraines with aura.  She is a non-smoker.  Does note a maternal aunt with a history of blood clots, however she notes she has been on combined birth control pills before without any issues.  Review of Systems:  Per HPI.   Objective:   BP 115/80   Pulse 82   Wt 219 lb 6.4 oz (99.5 kg)   SpO2 97%   BMI 33.36 kg/m  Vitals and nursing note reviewed.  General: Pleasant young female with toddler in her lap, well nourished, well developed, in no acute distress with non-toxic appearance Resp: Breathing comfortably on room air, speaking in full sentences MSK: gait normal Neuro: Alert and oriented, speech normal  Assessment & Plan:   Miscarriage Patient is status post spontaneous miscarriage on 03/08/2019.  Her bleeding has resolved.  Beta-hCG down-trending: Last beta-hCG was 32 on 3/11, down from  3252 on 3/5.  Urine pregnancy test negative today. Informed patient she may have sexual intercourse at this time.  Patient to start OCPs. Recommended using back up protection for at least 1 week after initiating OCPs.  RTC as needed.  Encounter for initial prescription of contraceptive pills Given she has had good success with Sprintec in the past, will restart. Discussed side effects, efficacy, and risks including VTE. Patient has no personal or first degree relative with VTE. She is a nonsmoker and denies any history of migraines with aura. We discussed reasons to call and return to care. The patient will follow up in 3 months to check in COCs. She was advised to take birth control with food and use back up contraception with a condom for 1 week.    Orders Placed This Encounter  Procedures  . POCT urine pregnancy   No orders of the defined types were placed in this encounter.   Orpah Cobb, DO PGY-2, [facility] 03/20/2019 9:38 AM

## 2019-03-20 ENCOUNTER — Ambulatory Visit (INDEPENDENT_AMBULATORY_CARE_PROVIDER_SITE_OTHER): Payer: Self-pay | Admitting: Family Medicine

## 2019-03-20 ENCOUNTER — Encounter: Payer: Self-pay | Admitting: Family Medicine

## 2019-03-20 ENCOUNTER — Other Ambulatory Visit: Payer: Self-pay

## 2019-03-20 VITALS — BP 115/80 | HR 82 | Wt 219.4 lb

## 2019-03-20 DIAGNOSIS — Z30011 Encounter for initial prescription of contraceptive pills: Secondary | ICD-10-CM

## 2019-03-20 DIAGNOSIS — O039 Complete or unspecified spontaneous abortion without complication: Secondary | ICD-10-CM

## 2019-03-20 LAB — POCT URINE PREGNANCY: Preg Test, Ur: NEGATIVE

## 2019-03-20 MED ORDER — NORGESTIMATE-ETH ESTRADIOL 0.25-35 MG-MCG PO TABS: 1.0000 | ORAL_TABLET | Freq: Every day | ORAL | 11 refills | Status: DC

## 2019-03-20 NOTE — Assessment & Plan Note (Addendum)
Patient is status post spontaneous miscarriage on 03/08/2019.  Her bleeding has resolved.  Beta-hCG down-trending: Last beta-hCG was 32 on 3/11, down from 3252 on 3/5.  Urine pregnancy test negative today. Informed patient she may have sexual intercourse at this time.  Patient to start OCPs. Recommended using back up protection for at least 1 week after initiating OCPs.  RTC as needed.

## 2019-03-20 NOTE — Assessment & Plan Note (Signed)
Given she has had good success with Sprintec in the past, will restart. Discussed side effects, efficacy, and risks including VTE. Patient has no personal or first degree relative with VTE. She is a nonsmoker and denies any history of migraines with aura. We discussed reasons to call and return to care. The patient will follow up in 3 months to check in COCs. She was advised to take birth control with food and use back up contraception with a condom for 1 week.

## 2019-03-20 NOTE — Patient Instructions (Addendum)
It was wonderful to see you today.  Please start your combined hormonal contraceptive pills today.  Let me know if you have side effects.  I recommend taking them with food.  Use backup contraception of a condom for 1 week   Thank you for choosing [facility].   Please call [phone] with any questions about today's appointment.  Please be sure to schedule follow up at the front  desk before you leave today for 3 months.  Terisa Starr, MD  Family Medicine

## 2019-03-24 ENCOUNTER — Other Ambulatory Visit: Payer: Medicaid Other

## 2019-03-25 ENCOUNTER — Ambulatory Visit: Payer: Medicaid Other | Admitting: Advanced Practice Midwife

## 2019-03-27 ENCOUNTER — Ambulatory Visit: Payer: Medicaid Other | Admitting: Family Medicine

## 2019-05-27 ENCOUNTER — Ambulatory Visit (INDEPENDENT_AMBULATORY_CARE_PROVIDER_SITE_OTHER): Payer: Medicaid Other | Admitting: Family Medicine

## 2019-05-27 ENCOUNTER — Other Ambulatory Visit (HOSPITAL_COMMUNITY)
Admission: RE | Admit: 2019-05-27 | Discharge: 2019-05-27 | Disposition: A | Discharge status: Home / Self Care | Payer: Medicaid Other | Source: Ambulatory Visit | Attending: Family Medicine | Admitting: Family Medicine

## 2019-05-27 ENCOUNTER — Other Ambulatory Visit: Payer: Self-pay

## 2019-05-27 VITALS — BP 128/78 | HR 89 | Ht 68.0 in | Wt 212.6 lb

## 2019-05-27 DIAGNOSIS — N939 Abnormal uterine and vaginal bleeding, unspecified: Secondary | ICD-10-CM | POA: Diagnosis not present

## 2019-05-27 DIAGNOSIS — N898 Other specified noninflammatory disorders of vagina: Secondary | ICD-10-CM

## 2019-05-27 LAB — POCT WET PREP (WET MOUNT)
Clue Cells Wet Prep Whiff POC: POSITIVE
Trichomonas Wet Prep HPF POC: ABSENT

## 2019-05-27 LAB — POCT URINALYSIS DIP (MANUAL ENTRY)
Bilirubin, UA: NEGATIVE
Blood, UA: NEGATIVE
Glucose, UA: NEGATIVE mg/dL
Ketones, POC UA: NEGATIVE mg/dL
Leukocytes, UA: NEGATIVE
Nitrite, UA: NEGATIVE
Protein Ur, POC: NEGATIVE mg/dL
Spec Grav, UA: 1.025 (ref 1.010–1.025)
Urobilinogen, UA: 0.2 E.U./dL
pH, UA: 8.5 — AB (ref 5.0–8.0)

## 2019-05-27 MED ORDER — METRONIDAZOLE 500 MG PO TABS: 500.0000 mg | ORAL_TABLET | Freq: Two times a day (BID) | ORAL | 0 refills | Status: AC

## 2019-05-27 NOTE — Progress Notes (Signed)
    SUBJECTIVE:   CHIEF COMPLAINT / HPI:   Vaginal bleeding and lower abdominal pain Patient presents with several days of vaginal bleeding after intense intercourse. She states this is a new partner and a condom was used but it did break during intercourse. She did not have intense pain during but noticed it after and then the pain resolved but she had subsequent bleeding. As of today, her bleeding has stopped yesterday. She has had this happened before after intense intercourse. She has no vaginal discharge. Today she is having some lower midline abdominal pain that is non-radiating. She denies fever, chills, back pain, nausea, vomiting, no dysuria, no increased urinary frequency, or urgency. She does want to be tested for STDs and to have a pregnancy test.  Pelvic exam normal Vaginal exam normal Cervical exam mild irritation and redness of cervical os  PERTINENT  PMH / PSH: GERD, Vit D deficiency  OBJECTIVE:   BP 128/78   Pulse 89   Ht 5\' 8"  (1.727 m)   Wt 212 lb 9.6 oz (96.4 kg)   SpO2 97%   BMI 32.33 kg/m   Gen: NAD Pelvic exam:  Physical Exam Exam conducted with a chaperone present.  Genitourinary:    General: Normal vulva.     Pubic Area: No rash.      Labia:        Right: No rash, tenderness, lesion or injury.        Left: No rash, tenderness, lesion or injury.      Vagina: Normal. No signs of injury and foreign body. No vaginal discharge, erythema, tenderness, bleeding or lesions.     Cervix: No cervical motion tenderness, discharge, friability, lesion, erythema or cervical bleeding.     ASSESSMENT/PLAN:   Vaginal bleeding Work up negative for pregnancy, GC/CC HIV, and RPR,. No yeast or trich but positive for BV. Patient has been informed of results. - Metronidazole 500mg  BID for 7 days     , DO [facility]

## 2019-05-27 NOTE — Patient Instructions (Signed)
It was great to meet you today! Thank you for letting me participate in your care!  Today, we discussed your symptoms and your exam was reassuring. I ordered some additional labs and if anything is abnormal I will call you.  Be well, Jules Schick, DO PGY-3, [facility]

## 2019-05-28 LAB — HIV ANTIBODY (ROUTINE TESTING W REFLEX): HIV Screen 4th Generation wRfx: NONREACTIVE

## 2019-05-28 LAB — RPR: RPR Ser Ql: NONREACTIVE

## 2019-05-28 LAB — CERVICOVAGINAL ANCILLARY ONLY
Chlamydia: NEGATIVE
Comment: NEGATIVE
Comment: NORMAL
Neisseria Gonorrhea: NEGATIVE

## 2019-05-29 LAB — HCG, SERUM, QUALITATIVE: hCG,Beta Subunit,Qual,Serum: NEGATIVE m[IU]/mL (ref <6)

## 2019-05-29 LAB — URINE CULTURE

## 2019-05-30 NOTE — Assessment & Plan Note (Signed)
Work up negative for pregnancy, GC/CC HIV, and RPR,. No yeast or trich but positive for BV. Patient has been informed of results. - Metronidazole 500mg  BID for 7 days

## 2019-07-07 ENCOUNTER — Emergency Department (HOSPITAL_COMMUNITY)
Admission: EM | Admit: 2019-07-07 | Discharge: 2019-07-07 | Disposition: A | Discharge status: Home / Self Care | Payer: Medicaid Other | Attending: Emergency Medicine | Admitting: Emergency Medicine

## 2019-07-07 ENCOUNTER — Encounter (HOSPITAL_COMMUNITY): Payer: Self-pay

## 2019-07-07 DIAGNOSIS — O26899 Other specified pregnancy related conditions, unspecified trimester: Secondary | ICD-10-CM | POA: Insufficient documentation

## 2019-07-07 DIAGNOSIS — R140 Abdominal distension (gaseous): Secondary | ICD-10-CM | POA: Diagnosis not present

## 2019-07-07 DIAGNOSIS — Z79899 Other long term (current) drug therapy: Secondary | ICD-10-CM | POA: Insufficient documentation

## 2019-07-07 DIAGNOSIS — O26891 Other specified pregnancy related conditions, first trimester: Secondary | ICD-10-CM | POA: Diagnosis not present

## 2019-07-07 DIAGNOSIS — Z3A01 Less than 8 weeks gestation of pregnancy: Secondary | ICD-10-CM | POA: Insufficient documentation

## 2019-07-07 LAB — POC URINE PREG, ED: Preg Test, Ur: POSITIVE — AB

## 2019-07-07 NOTE — Discharge Instructions (Addendum)
Your pregnancy test was positive.  Please follow-up with an OB/GYN.  If you not currently established with 1 you may follow-up with the women's clinic.  I have provided you with the information for follow-up.

## 2019-07-07 NOTE — ED Triage Notes (Signed)
Pt requesting pregnancy test, took 6 home tests and they were all positive. Pt denies vaginal bleeding, some abd cramping.

## 2019-07-07 NOTE — ED Notes (Signed)
Patient verbalizes understanding of discharge instructions. Opportunity for questioning and answers were provided. Armband removed by staff. Patient discharged from ED.  

## 2019-07-07 NOTE — ED Provider Notes (Signed)
[facility] EMERGENCY DEPARTMENT Provider Note   CSN: [id number] Arrival date & time: 07/07/19  1002     History Chief Complaint  Patient presents with  . Possible Pregnancy    Kristen Dean is a 19 y.o. female.  HPI  Patient is a 19 year old female with a history of 1 full-term pregnancy without issue presented today for confirmation of pregnancy.  She states that she took 6 home pregnancy test today and they were all positive.  She endorses some abdominal fullness but no abdominal pain, vaginal bleeding, pelvic pain, nausea or vomiting. Last menstrual period was 6/4. She denies any other complaints today.     Past Medical History:  Diagnosis Date  . GERD (gastroesophageal reflux disease) 08/08/2017  . Medical history non-contributory     Patient Active Problem List   Diagnosis Date Noted  . Vaginal bleeding 12/24/2018  . GERD (gastroesophageal reflux disease) 08/08/2017  . Miscarriage 11/23/2016  . Vitamin D deficiency 02/21/2015  . Encounter for initial prescription of contraceptive pills 01/27/2013  . Learning disability 01/27/2013    Past Surgical History:  Procedure Laterality Date  . NO PAST SURGERIES       OB History    Gravida  3   Para  1   Term  1   Preterm      AB  2   Living  1     SAB  2   TAB      Ectopic      Multiple  0   Live Births  1           Family History  Problem Relation Age of Onset  . Diabetes Mother   . Diabetes Maternal Aunt   . Diabetes Maternal Grandmother   . Diabetes Sister   . Mental retardation Sister   . Diabetes Maternal Uncle   . Diabetes Maternal Grandfather     Social History   Tobacco Use  . Smoking status: Never Smoker  . Smokeless tobacco: Never Used  Vaping Use  . Vaping Use: Never used  Substance Use Topics  . Alcohol use: No  . Drug use: No    Home Medications Prior to Admission medications   Medication Sig Start Date End Date Taking? Authorizing Provider   norgestimate-ethinyl estradiol (SPRINTEC 28) 0.25-35 MG-MCG tablet Take 1 tablet by mouth daily. 03/20/19   Mullis, Kiersten P, DO  Prenatal Vit-Fe Fumarate-FA (PREPLUS) 27-1 MG TABS Take 1 tablet by mouth daily. 03/07/19   Calvert Cantor, CNM    Allergies    Patient has no known allergies.  Review of Systems   Review of Systems  Constitutional: Negative for chills and fever.  HENT: Negative for congestion.   Eyes: Negative for pain.  Respiratory: Negative for cough and shortness of breath.   Cardiovascular: Negative for chest pain and leg swelling.  Gastrointestinal: Negative for abdominal pain and vomiting.       Abdominal fullness.  Genitourinary: Negative for dysuria, pelvic pain, vaginal bleeding and vaginal discharge.  Musculoskeletal: Negative for myalgias.  Skin: Negative for rash.  Neurological: Negative for dizziness and headaches.    Physical Exam Updated Vital Signs BP 116/65 (BP Location: Left Arm)   Pulse 94   Temp 98.8 F (37.1 C) (Oral)   Resp 20   LMP 06/12/2019   SpO2 99%   Physical Exam Vitals and nursing note reviewed.  Constitutional:      General: She is not in acute distress.  Comments: Pleasant 19 year old female appears stated age.  In no acute distress.  Sitting comfortably in bed with friend at bedside.  HENT:     Head: Normocephalic and atraumatic.     Nose: Nose normal.     Mouth/Throat:     Mouth: Mucous membranes are moist.  Eyes:     General: No scleral icterus. Cardiovascular:     Rate and Rhythm: Normal rate and regular rhythm.     Pulses: Normal pulses.     Heart sounds: Normal heart sounds.  Pulmonary:     Effort: Pulmonary effort is normal. No respiratory distress.     Breath sounds: Normal breath sounds. No wheezing.  Abdominal:     Palpations: Abdomen is soft.     Tenderness: There is no abdominal tenderness. There is no right CVA tenderness, left CVA tenderness, guarding or rebound.  Musculoskeletal:     Cervical  back: Normal range of motion.     Right lower leg: No edema.     Left lower leg: No edema.  Skin:    General: Skin is warm and dry.     Capillary Refill: Capillary refill takes less than 2 seconds.  Neurological:     Mental Status: She is alert. Mental status is at baseline.  Psychiatric:        Mood and Affect: Mood normal.        Behavior: Behavior normal.     ED Results / Procedures / Treatments   Labs (all labs ordered are listed, but only abnormal results are displayed) Labs Reviewed  POC URINE PREG, ED - Abnormal; Notable for the following components:      Result Value   Preg Test, Ur POSITIVE (*)    All other components within normal limits    EKG None  Radiology No results found.  Procedures Procedures (including critical care time)  Medications Ordered in ED Medications - No data to display  ED Course  I have reviewed the triage vital signs and the nursing notes.  Pertinent labs & imaging results that were available during my care of the patient were reviewed by me and considered in my medical decision making (see chart for details).    MDM Rules/Calculators/A&P                          Patient is 19 year old female presented today with chief complaint of "wanted to confirm my pregnancy.  "  Urine pregnancy test is positive.  Discussed positive test with patient.  She is understanding of follow-up instructions with an OB/GYN if she is not already established with 1 I have given her the information for the women's center.  She denies any symptoms today she does have some abdominal fullness but no pain.  Abdominal exam is benign with obese nontender abdomen.  Will discharge with close follow-up for prenatal care.  She is given return precautions.  Notification of ectopic today as she has no significant pain, normal exam, normal vital signs, no vaginal discharge or bleeding.  Suspect that she is having this full sensation because of the new pregnancy.  Her  last menstrual period was approximately 1 month ago.   Final Clinical Impression(s) / ED Diagnoses Final diagnoses:  Less than [redacted] weeks gestation of pregnancy    Rx / DC Orders ED Discharge Orders    None       Gailen Shelter, [place] 07/07/19 1127    Margarita Grizzle, MD 07/08/19  1320  

## 2019-07-08 ENCOUNTER — Other Ambulatory Visit: Payer: Medicaid Other

## 2019-07-08 ENCOUNTER — Other Ambulatory Visit: Payer: Self-pay

## 2019-07-08 DIAGNOSIS — Z3481 Encounter for supervision of other normal pregnancy, first trimester: Secondary | ICD-10-CM | POA: Diagnosis not present

## 2019-07-10 LAB — CULTURE, OB URINE

## 2019-07-10 LAB — URINE CULTURE, OB REFLEX

## 2019-07-11 LAB — OBSTETRIC PANEL, INCLUDING HIV
Antibody Screen: NEGATIVE
Basophils Absolute: 0 10*3/uL (ref 0.0–0.2)
Basos: 1 %
EOS (ABSOLUTE): 0.1 10*3/uL (ref 0.0–0.4)
Eos: 1 %
HIV Screen 4th Generation wRfx: NONREACTIVE
Hematocrit: 38.1 % (ref 34.0–46.6)
Hemoglobin: 12.7 g/dL (ref 11.1–15.9)
Hepatitis B Surface Ag: NEGATIVE
Immature Grans (Abs): 0 10*3/uL (ref 0.0–0.1)
Immature Granulocytes: 0 %
Lymphocytes Absolute: 2 10*3/uL (ref 0.7–3.1)
Lymphs: 22 %
MCH: 29.9 pg (ref 26.6–33.0)
MCHC: 33.3 g/dL (ref 31.5–35.7)
MCV: 90 fL (ref 79–97)
Monocytes Absolute: 0.5 10*3/uL (ref 0.1–0.9)
Monocytes: 6 %
Neutrophils Absolute: 6.2 10*3/uL (ref 1.4–7.0)
Neutrophils: 70 %
Platelets: 333 10*3/uL (ref 150–450)
RBC: 4.25 x10E6/uL (ref 3.77–5.28)
RDW: 13.7 % (ref 11.7–15.4)
RPR Ser Ql: NONREACTIVE
Rh Factor: POSITIVE
Rubella Antibodies, IGG: 13 index (ref >0.99)
WBC: 8.8 10*3/uL (ref 3.4–10.8)

## 2019-07-11 LAB — HCV INTERPRETATION

## 2019-07-11 LAB — HGB FRACTIONATION CASCADE
Hgb A2: 2.3 % (ref 1.8–3.2)
Hgb A: 97.7 % (ref 96.4–98.8)
Hgb F: 0 % (ref 0.0–2.0)
Hgb S: 0 %

## 2019-07-11 LAB — HCV AB W REFLEX TO QUANT PCR: HCV Ab: <0.1 s/co ratio (ref 0.0–0.9)

## 2019-07-17 ENCOUNTER — Encounter: Payer: Self-pay | Admitting: Family Medicine

## 2019-07-17 ENCOUNTER — Ambulatory Visit (INDEPENDENT_AMBULATORY_CARE_PROVIDER_SITE_OTHER): Payer: Medicaid Other | Admitting: Family Medicine

## 2019-07-17 ENCOUNTER — Other Ambulatory Visit: Payer: Self-pay

## 2019-07-17 VITALS — BP 112/60 | HR 75 | Wt 206.0 lb

## 2019-07-17 DIAGNOSIS — Z3492 Encounter for supervision of normal pregnancy, unspecified, second trimester: Secondary | ICD-10-CM | POA: Insufficient documentation

## 2019-07-17 DIAGNOSIS — Z3491 Encounter for supervision of normal pregnancy, unspecified, first trimester: Secondary | ICD-10-CM

## 2019-07-17 DIAGNOSIS — O219 Vomiting of pregnancy, unspecified: Secondary | ICD-10-CM | POA: Insufficient documentation

## 2019-07-17 DIAGNOSIS — Z3493 Encounter for supervision of normal pregnancy, unspecified, third trimester: Secondary | ICD-10-CM | POA: Insufficient documentation

## 2019-07-17 MED ORDER — VITAMIN B-6 25 MG PO TABS: 50.0000 mg | ORAL_TABLET | Freq: Three times a day (TID) | ORAL | 1 refills | Status: DC | PRN

## 2019-07-17 MED ORDER — PREPLUS 27-1 MG PO TABS: 1.0000 | ORAL_TABLET | Freq: Every day | ORAL | 13 refills | Status: DC

## 2019-07-17 NOTE — Patient Instructions (Signed)
Please go to your ultrasound.  Take your prenatals every day.  You can take pyridoxine as needed for nausea with the instruction on the bottle.   First Trimester of Pregnancy  The first trimester of pregnancy is from week 1 until the end of week 13 (months 1 through 3). During this time, your baby will begin to develop inside you. At 6-8 weeks, the eyes and face are formed, and the heartbeat can be seen on ultrasound. At the end of 12 weeks, all the baby's organs are formed. Prenatal care is all the medical care you receive before the birth of your baby. Make sure you get good prenatal care and follow all of your doctor's instructions. Follow these instructions at home: Medicines  Take over-the-counter and prescription medicines only as told by your doctor. Some medicines are safe and some medicines are not safe during pregnancy.  Take a prenatal vitamin that contains at least 600 micrograms (mcg) of folic acid.  If you have trouble pooping (constipation), take medicine that will make your stool soft (stool softener) if your doctor approves. Eating and drinking   Eat regular, healthy meals.  Your doctor will tell you the amount of weight gain that is right for you.  Avoid raw meat and uncooked cheese.  If you feel sick to your stomach (nauseous) or throw up (vomit): ? Eat 4 or 5 small meals a day instead of 3 large meals. ? Try eating a few soda crackers. ? Drink liquids between meals instead of during meals.  To prevent constipation: ? Eat foods that are high in fiber, like fresh fruits and vegetables, whole grains, and beans. ? Drink enough fluids to keep your pee (urine) clear or pale yellow. Activity  Exercise only as told by your doctor. Stop exercising if you have cramps or pain in your lower belly (abdomen) or low back.  Do not exercise if it is too hot, too humid, or if you are in a place of great height (high altitude).  Try to avoid standing for long periods of time.  Move your legs often if you must stand in one place for a long time.  Avoid heavy lifting.  Wear low-heeled shoes. Sit and stand up straight.  You can have sex unless your doctor tells you not to. Relieving pain and discomfort  Wear a good support bra if your breasts are sore.  Take warm water baths (sitz baths) to soothe pain or discomfort caused by hemorrhoids. Use hemorrhoid cream if your doctor says it is okay.  Rest with your legs raised if you have leg cramps or low back pain.  If you have puffy, bulging veins (varicose veins) in your legs: ? Wear support hose or compression stockings as told by your doctor. ? Raise (elevate) your feet for 15 minutes, 3-4 times a day. ? Limit salt in your food. Prenatal care  Schedule your prenatal visits by the twelfth week of pregnancy.  Write down your questions. Take them to your prenatal visits.  Keep all your prenatal visits as told by your doctor. This is important. Safety  Wear your seat belt at all times when driving.  Make a list of emergency phone numbers. The list should include numbers for family, friends, the hospital, and police and fire departments. General instructions  Ask your doctor for a referral to a local prenatal class. Begin classes no later than at the start of month 6 of your pregnancy.  Ask for help if you need counseling  or if you need help with nutrition. Your doctor can give you advice or tell you where to go for help.  Do not use hot tubs, steam rooms, or saunas.  Do not douche or use tampons or scented sanitary pads.  Do not cross your legs for long periods of time.  Avoid all herbs and alcohol. Avoid drugs that are not approved by your doctor.  Do not use any tobacco products, including cigarettes, chewing tobacco, and electronic cigarettes. If you need help quitting, ask your doctor. You may get counseling or other support to help you quit.  Avoid cat litter boxes and soil used by cats. These  carry germs that can cause birth defects in the baby and can cause a loss of your baby (miscarriage) or stillbirth.  Visit your dentist. At home, brush your teeth with a soft toothbrush. Be gentle when you floss. Contact a doctor if:  You are dizzy.  You have mild cramps or pressure in your lower belly.  You have a nagging pain in your belly area.  You continue to feel sick to your stomach, you throw up, or you have watery poop (diarrhea).  You have a bad smelling fluid coming from your vagina.  You have pain when you pee (urinate).  You have increased puffiness (swelling) in your face, hands, legs, or ankles. Get help right away if:  You have a fever.  You are leaking fluid from your vagina.  You have spotting or bleeding from your vagina.  You have very bad belly cramping or pain.  You gain or lose weight rapidly.  You throw up blood. It may look like coffee grounds.  You are around people who have [place] measles, fifth disease, or chickenpox.  You have a very bad headache.  You have shortness of breath.  You have any kind of trauma, such as from a fall or a car accident. Summary  The first trimester of pregnancy is from week 1 until the end of week 13 (months 1 through 3).  To take care of yourself and your unborn baby, you will need to eat healthy meals, take medicines only if your doctor tells you to do so, and do activities that are safe for you and your baby.  Keep all follow-up visits as told by your doctor. This is important as your doctor will have to ensure that your baby is healthy and growing well. This information is not intended to replace advice given to you by your health care provider. Make sure you discuss any questions you have with your health care provider. Document Revised: 04/11/2018 Document Reviewed: 12/28/2015 Elsevier Patient Education  2020 [employer].
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
## 2019-07-22 ENCOUNTER — Inpatient Hospital Stay (HOSPITAL_COMMUNITY): Payer: Medicaid Other

## 2019-07-22 ENCOUNTER — Encounter (HOSPITAL_COMMUNITY): Payer: Self-pay | Admitting: Obstetrics and Gynecology

## 2019-07-22 ENCOUNTER — Telehealth: Payer: Self-pay

## 2019-07-22 ENCOUNTER — Inpatient Hospital Stay (HOSPITAL_COMMUNITY)
Admission: AD | Admit: 2019-07-22 | Discharge: 2019-07-22 | Disposition: A | Discharge status: Home / Self Care | Payer: Medicaid Other | Attending: Obstetrics and Gynecology | Admitting: Obstetrics and Gynecology

## 2019-07-22 ENCOUNTER — Other Ambulatory Visit: Payer: Self-pay

## 2019-07-22 DIAGNOSIS — Z79899 Other long term (current) drug therapy: Secondary | ICD-10-CM | POA: Insufficient documentation

## 2019-07-22 DIAGNOSIS — R109 Unspecified abdominal pain: Secondary | ICD-10-CM | POA: Insufficient documentation

## 2019-07-22 DIAGNOSIS — Z3A01 Less than 8 weeks gestation of pregnancy: Secondary | ICD-10-CM | POA: Diagnosis not present

## 2019-07-22 DIAGNOSIS — O26891 Other specified pregnancy related conditions, first trimester: Secondary | ICD-10-CM | POA: Diagnosis not present

## 2019-07-22 DIAGNOSIS — O99611 Diseases of the digestive system complicating pregnancy, first trimester: Secondary | ICD-10-CM | POA: Diagnosis not present

## 2019-07-22 DIAGNOSIS — K219 Gastro-esophageal reflux disease without esophagitis: Secondary | ICD-10-CM | POA: Diagnosis not present

## 2019-07-22 LAB — HIV ANTIBODY (ROUTINE TESTING W REFLEX): HIV Screen 4th Generation wRfx: NONREACTIVE

## 2019-07-22 LAB — COMPREHENSIVE METABOLIC PANEL
ALT: 12 U/L (ref 0–44)
AST: 15 U/L (ref 15–41)
Albumin: 3.5 g/dL (ref 3.5–5.0)
Alkaline Phosphatase: 52 U/L (ref 38–126)
Anion gap: 7 (ref 5–15)
BUN: 5 mg/dL — ABNORMAL LOW (ref 6–20)
CO2: 22 mmol/L (ref 22–32)
Calcium: 9.1 mg/dL (ref 8.9–10.3)
Chloride: 106 mmol/L (ref 98–111)
Creatinine, Ser: 0.72 mg/dL (ref 0.44–1.00)
GFR calc Af Amer: >60 mL/min (ref >60)
GFR calc non Af Amer: >60 mL/min (ref >60)
Glucose, Bld: 89 mg/dL (ref 70–99)
Potassium: 3.8 mmol/L (ref 3.5–5.1)
Sodium: 135 mmol/L (ref 135–145)
Total Bilirubin: 0.4 mg/dL (ref 0.3–1.2)
Total Protein: 7.2 g/dL (ref 6.5–8.1)

## 2019-07-22 LAB — WET PREP, GENITAL
Clue Cells Wet Prep HPF POC: NONE SEEN
Sperm: NONE SEEN
Trich, Wet Prep: NONE SEEN
Yeast Wet Prep HPF POC: NONE SEEN

## 2019-07-22 LAB — URINALYSIS, ROUTINE W REFLEX MICROSCOPIC
Bilirubin Urine: NEGATIVE
Glucose, UA: NEGATIVE mg/dL
Hgb urine dipstick: NEGATIVE
Ketones, ur: NEGATIVE mg/dL
Leukocytes,Ua: NEGATIVE
Nitrite: NEGATIVE
Protein, ur: NEGATIVE mg/dL
Specific Gravity, Urine: 1.026 (ref 1.005–1.030)
pH: 5 (ref 5.0–8.0)

## 2019-07-22 LAB — CBC WITH DIFFERENTIAL/PLATELET
Abs Immature Granulocytes: 0.02 10*3/uL (ref 0.00–0.07)
Basophils Absolute: 0 10*3/uL (ref 0.0–0.1)
Basophils Relative: 1 %
Eosinophils Absolute: 0.1 10*3/uL (ref 0.0–0.5)
Eosinophils Relative: 1 %
HCT: 36.9 % (ref 36.0–46.0)
Hemoglobin: 12.2 g/dL (ref 12.0–15.0)
Immature Granulocytes: 0 %
Lymphocytes Relative: 26 %
Lymphs Abs: 2.3 10*3/uL (ref 0.7–4.0)
MCH: 29 pg (ref 26.0–34.0)
MCHC: 33.1 g/dL (ref 30.0–36.0)
MCV: 87.9 fL (ref 80.0–100.0)
Monocytes Absolute: 0.6 10*3/uL (ref 0.1–1.0)
Monocytes Relative: 7 %
Neutro Abs: 5.8 10*3/uL (ref 1.7–7.7)
Neutrophils Relative %: 65 %
Platelets: 349 10*3/uL (ref 150–400)
RBC: 4.2 MIL/uL (ref 3.87–5.11)
RDW: 14.6 % (ref 11.5–15.5)
WBC: 8.8 10*3/uL (ref 4.0–10.5)
nRBC: 0 % (ref 0.0–0.2)

## 2019-07-22 LAB — HCG, QUANTITATIVE, PREGNANCY: hCG, Beta Chain, Quant, S: 23336 m[IU]/mL — ABNORMAL HIGH (ref <5)
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
## 2019-07-22 NOTE — Discharge Instructions (Signed)
Abdominal Pain During Pregnancy  Belly (abdominal) pain is common during pregnancy. There are many possible causes. Most of the time, it is not a serious problem. Other times, it can be a sign that something is wrong with the pregnancy. Always tell your doctor if you have belly pain. Follow these instructions at home:  Do not have sex or put anything in your vagina until your pain goes away completely.  Get plenty of rest until your pain gets better.  Drink enough fluid to keep your pee (urine) pale yellow.  Take over-the-counter and prescription medicines only as told by your doctor.  Keep all follow-up visits as told by your doctor. This is important. Contact a doctor if:  Your pain continues or gets worse after resting.  You have lower belly pain that: ? Comes and goes at regular times. ? Spreads to your back. ? Feels like menstrual cramps.  You have pain or burning when you pee (urinate). Get help right away if:  You have a fever or chills.  You have vaginal bleeding.  You are leaking fluid from your vagina.  You are passing tissue from your vagina.  You throw up (vomit) for more than 24 hours.  You have watery poop (diarrhea) for more than 24 hours.  Your baby is moving less than usual.  You feel very weak or faint.  You have shortness of breath.  You have very bad pain in your upper belly. Summary  Belly (abdominal) pain is common during pregnancy. There are many possible causes.  If you have belly pain during pregnancy, tell your doctor right away.  Keep all follow-up visits as told by your doctor. This is important. This information is not intended to replace advice given to you by your health care provider. Make sure you discuss any questions you have with your health care provider. Document Revised: 04/08/2018 Document Reviewed: 03/23/2016 Elsevier Patient Education  2020 [employer]  
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
## 2019-07-22 NOTE — Telephone Encounter (Signed)
PCP aware. I will await results/documentation from patient's MAU visit.

## 2019-07-23 LAB — GC/CHLAMYDIA PROBE AMP (~~LOC~~) NOT AT ARMC
Chlamydia: NEGATIVE
Comment: NEGATIVE
Comment: NORMAL
Neisseria Gonorrhea: NEGATIVE

## 2019-07-24 ENCOUNTER — Telehealth: Payer: Self-pay | Admitting: Family Medicine

## 2019-07-24 ENCOUNTER — Ambulatory Visit: Admission: RE | Admit: 2019-07-24 | Payer: Medicaid Other | Source: Ambulatory Visit

## 2019-07-24 NOTE — Telephone Encounter (Signed)
Steward Drone from [facility] is calling to inform Dr. Neita Garnet that patient was seen in the MAU and received her ultrasound while she was there. They are going to cancel the ultrasound she had scheduled for today 07/24/19. They are contacting patient.

## 2019-07-24 NOTE — Telephone Encounter (Signed)
Patient called to see if she needed to go to ultrasound today. I spoke with the nurse she checked patients chart and she did not need the ultrasound also messaged doctor [place]. Patient did call and cancel appointment. Thanks

## 2019-07-30 ENCOUNTER — Telehealth: Payer: Self-pay

## 2019-07-30 NOTE — Telephone Encounter (Signed)
Patient calls nurse line regarding nausea/ vomiting and dizziness. Patient is 7.5 gestation. Patient denies abdominal pain, bleeding or headaches. Patient states that dizziness occurs after vomiting and sometimes when changing positions. Advised patient to increase PO fluids if possible, eat small, frequent meals and to change positions slowly. Scheduled patient in ATC tomorrow morning to be evaluated.   Strict ED precautions given.   Veronda Prude, RN

## 2019-07-31 ENCOUNTER — Encounter: Payer: Self-pay | Admitting: Family Medicine

## 2019-07-31 ENCOUNTER — Other Ambulatory Visit: Payer: Self-pay

## 2019-07-31 ENCOUNTER — Ambulatory Visit (INDEPENDENT_AMBULATORY_CARE_PROVIDER_SITE_OTHER): Payer: Medicaid Other | Admitting: Family Medicine

## 2019-07-31 DIAGNOSIS — Z3491 Encounter for supervision of normal pregnancy, unspecified, first trimester: Secondary | ICD-10-CM | POA: Diagnosis not present

## 2019-07-31 DIAGNOSIS — O219 Vomiting of pregnancy, unspecified: Secondary | ICD-10-CM

## 2019-07-31 MED ORDER — VITAMIN B-6 25 MG PO TABS: 50.0000 mg | ORAL_TABLET | Freq: Three times a day (TID) | ORAL | 1 refills | Status: DC | PRN

## 2019-07-31 NOTE — Progress Notes (Signed)
    SUBJECTIVE:   CHIEF COMPLAINT / HPI: Bad nausea  Patient reports vomiting 4-5 times daily x2 weeks.  Mostly during morning and evening but can be intermittently.  Has been able to take down some fluids.  Yesterday with unable to keep anything down at all.  Currently 7 weeks 6 days gestation.  Had similar symptoms in first pregnancy but reports the symptoms have worsened. Denies any fevers, diarrhea orr sick contacts. Has not tried Vit B 6 as previously ordered as could not find OTC.  PERTINENT  PMH / PSH:    OBJECTIVE:   BP (!) 110/62   Pulse 62   Wt (!) 210 lb 3.2 oz (95.3 kg)   LMP 06/06/2019 (Exact Date)   BMI 31.96 kg/m   General: Alert and oriented, no apparent distress, mucus membranes moist  Gastrointestinal: Bowel sounds present. No abdominal pain  ASSESSMENT/PLAN:   Vomiting or nausea of pregnancy -Restart Pyridoxine 50mg  TID, if symptoms worsens can switch to Diclegis. -Next OB visit 08/19 -Follow up with PCP as needed      9/19, MD [facility]

## 2019-07-31 NOTE — Assessment & Plan Note (Signed)
-  Restart Pyridoxine 50mg  TID, if symptoms worsens can switch to Diclegis. -Next OB visit 08/19 -Follow up with PCP as needed

## 2019-07-31 NOTE — Patient Instructions (Signed)
Thank you for coming to see me today. It was a pleasure.   Prescription sent for vitamin B6.  Take 3 times a day as needed.  If you are unable to get this medication please let me know via MyChart and I will be happy to prescribe a different medication for you.  Continue to stay hydrated as best as you can.  Small amounts of fluid frequently can help.  Follow-up with PCP if symptoms worsen   If you have any questions or concerns, please do not hesitate to call the office at [phone].  Best,  Dana Allan, MD [facility]

## 2019-08-21 ENCOUNTER — Ambulatory Visit (INDEPENDENT_AMBULATORY_CARE_PROVIDER_SITE_OTHER): Payer: Medicaid Other | Admitting: Family Medicine

## 2019-08-21 ENCOUNTER — Other Ambulatory Visit: Payer: Self-pay

## 2019-08-21 VITALS — BP 102/62 | HR 69 | Wt 208.8 lb

## 2019-08-21 DIAGNOSIS — O99211 Obesity complicating pregnancy, first trimester: Secondary | ICD-10-CM

## 2019-08-21 DIAGNOSIS — Z3491 Encounter for supervision of normal pregnancy, unspecified, first trimester: Secondary | ICD-10-CM

## 2019-08-21 NOTE — Progress Notes (Signed)
  Patient Name: [name] Date of Birth: [birth year] [facility] Prenatal Visit  Kristen Dean is a 19 y.o. [phone] at [redacted]w[redacted]d here for routine follow up. She is dated by early ultrasound, given she was on contraception in the 3 months prior.  She reports no complaints.  She denies vaginal bleeding or LOF.  She reports that she is not taking B6, but she is feeling betetr with nausea.  She is taking prenatal vitamins.  See flow sheet for details.  Vitals:   08/21/19 1508  BP: 102/62  Pulse: 69     A/P: Pregnancy at [redacted]w[redacted]d.  Doing well.    1. Routine Prenatal Care:  Marland Kitchen Dating reviewed, dating tab is correct , discussed with Dr. Manson Passey that she should be dated by early [place] as she was on birth control in 3 months prior to conception. . Fetal heart tones not attempted given GA* . Influenza vaccine not administered as not influenza season.   . The patient has the following indication for screening preexisting diabetes: BMI > 25 and high risk ethnicity (Latino, [place] American, Native American, [place] Islander, Asian Naval architect) . Marland Kitchen Anatomy ultrasound ordered and scheduled for patient. . Patient is not interested in genetic screening.  . Pregnancy education including expected weight gain in pregnancy, OTC medication use, continued use of prenatal vitamin, smoking cessation if applicable, and nutrition in pregnancy.  Also counseled on appropriate pregnancy spacing. . Bleeding and pain precautions reviewed. . G/C Negative at [facility] on 7/20  2. Pregnancy issues include the following and were addressed as appropriate today:  . Obesity o Early 1 hr GTT ordered today, patient will come back for this . Nausea o Improving, no treatment needed per patient but can use pyridoxine if desires . Problem list  and pregnancy box updated: Yes.   Follow up 4 weeks, scheduled in [facility] for this follow up for second trimester visit.

## 2019-08-21 NOTE — Patient Instructions (Signed)
Your appropriate weight gain should be 11-20 lbs for your entire pregnancy.  Focus on healthy foods.  Come back for your early glucose tolerance test at your earliest convenience, you don't need an appointment for this.  First Trimester of Pregnancy  The first trimester of pregnancy is from week 1 until the end of week 13 (months 1 through 3). During this time, your baby will begin to develop inside you. At 6-8 weeks, the eyes and face are formed, and the heartbeat can be seen on ultrasound. At the end of 12 weeks, all the baby's organs are formed. Prenatal care is all the medical care you receive before the birth of your baby. Make sure you get good prenatal care and follow all of your doctor's instructions. Follow these instructions at home: Medicines  Take over-the-counter and prescription medicines only as told by your doctor. Some medicines are safe and some medicines are not safe during pregnancy.  Take a prenatal vitamin that contains at least 600 micrograms (mcg) of folic acid.  If you have trouble pooping (constipation), take medicine that will make your stool soft (stool softener) if your doctor approves. Eating and drinking   Eat regular, healthy meals.  Your doctor will tell you the amount of weight gain that is right for you.  Avoid raw meat and uncooked cheese.  If you feel sick to your stomach (nauseous) or throw up (vomit): ? Eat 4 or 5 small meals a day instead of 3 large meals. ? Try eating a few soda crackers. ? Drink liquids between meals instead of during meals.  To prevent constipation: ? Eat foods that are high in fiber, like fresh fruits and vegetables, whole grains, and beans. ? Drink enough fluids to keep your pee (urine) clear or pale yellow. Activity  Exercise only as told by your doctor. Stop exercising if you have cramps or pain in your lower belly (abdomen) or low back.  Do not exercise if it is too hot, too humid, or if you are in a place of great  height (high altitude).  Try to avoid standing for long periods of time. Move your legs often if you must stand in one place for a long time.  Avoid heavy lifting.  Wear low-heeled shoes. Sit and stand up straight.  You can have sex unless your doctor tells you not to. Relieving pain and discomfort  Wear a good support bra if your breasts are sore.  Take warm water baths (sitz baths) to soothe pain or discomfort caused by hemorrhoids. Use hemorrhoid cream if your doctor says it is okay.  Rest with your legs raised if you have leg cramps or low back pain.  If you have puffy, bulging veins (varicose veins) in your legs: ? Wear support hose or compression stockings as told by your doctor. ? Raise (elevate) your feet for 15 minutes, 3-4 times a day. ? Limit salt in your food. Prenatal care  Schedule your prenatal visits by the twelfth week of pregnancy.  Write down your questions. Take them to your prenatal visits.  Keep all your prenatal visits as told by your doctor. This is important. Safety  Wear your seat belt at all times when driving.  Make a list of emergency phone numbers. The list should include numbers for family, friends, the hospital, and police and fire departments. General instructions  Ask your doctor for a referral to a local prenatal class. Begin classes no later than at the start of month 6 of  your pregnancy.  Ask for help if you need counseling or if you need help with nutrition. Your doctor can give you advice or tell you where to go for help.  Do not use hot tubs, steam rooms, or saunas.  Do not douche or use tampons or scented sanitary pads.  Do not cross your legs for long periods of time.  Avoid all herbs and alcohol. Avoid drugs that are not approved by your doctor.  Do not use any tobacco products, including cigarettes, chewing tobacco, and electronic cigarettes. If you need help quitting, ask your doctor. You may get counseling or other support  to help you quit.  Avoid cat litter boxes and soil used by cats. These carry germs that can cause birth defects in the baby and can cause a loss of your baby (miscarriage) or stillbirth.  Visit your dentist. At home, brush your teeth with a soft toothbrush. Be gentle when you floss. Contact a doctor if:  You are dizzy.  You have mild cramps or pressure in your lower belly.  You have a nagging pain in your belly area.  You continue to feel sick to your stomach, you throw up, or you have watery poop (diarrhea).  You have a bad smelling fluid coming from your vagina.  You have pain when you pee (urinate).  You have increased puffiness (swelling) in your face, hands, legs, or ankles. Get help right away if:  You have a fever.  You are leaking fluid from your vagina.  You have spotting or bleeding from your vagina.  You have very bad belly cramping or pain.  You gain or lose weight rapidly.  You throw up blood. It may look like coffee grounds.  You are around people who have [place] measles, fifth disease, or chickenpox.  You have a very bad headache.  You have shortness of breath.  You have any kind of trauma, such as from a fall or a car accident. Summary  The first trimester of pregnancy is from week 1 until the end of week 13 (months 1 through 3).  To take care of yourself and your unborn baby, you will need to eat healthy meals, take medicines only if your doctor tells you to do so, and do activities that are safe for you and your baby.  Keep all follow-up visits as told by your doctor. This is important as your doctor will have to ensure that your baby is healthy and growing well. This information is not intended to replace advice given to you by your health care provider. Make sure you discuss any questions you have with your health care provider. Document Revised: 04/11/2018 Document Reviewed: 12/28/2015 Elsevier Patient Education  2020 [employer].

## 2019-08-22 DIAGNOSIS — O99211 Obesity complicating pregnancy, first trimester: Secondary | ICD-10-CM | POA: Insufficient documentation

## 2019-08-22 DIAGNOSIS — O99213 Obesity complicating pregnancy, third trimester: Secondary | ICD-10-CM | POA: Insufficient documentation

## 2019-08-29 ENCOUNTER — Other Ambulatory Visit: Payer: Self-pay

## 2019-08-29 ENCOUNTER — Other Ambulatory Visit (INDEPENDENT_AMBULATORY_CARE_PROVIDER_SITE_OTHER): Payer: Medicaid Other

## 2019-08-29 DIAGNOSIS — R110 Nausea: Secondary | ICD-10-CM | POA: Diagnosis present

## 2019-08-29 DIAGNOSIS — O99211 Obesity complicating pregnancy, first trimester: Secondary | ICD-10-CM

## 2019-08-29 LAB — POCT 1 HR PRENATAL GLUCOSE: Glucose 1 Hr Prenatal, POC: 118 mg/dL

## 2019-08-29 MED ORDER — ONDANSETRON 4 MG PO TBDP
4.0000 mg | ORAL_TABLET | Freq: Once | ORAL | Status: AC
  Administered 2019-08-29: 4 mg via ORAL

## 2019-08-30 ENCOUNTER — Encounter: Payer: Self-pay | Admitting: Family Medicine

## 2019-09-08 ENCOUNTER — Other Ambulatory Visit: Payer: Self-pay

## 2019-09-08 ENCOUNTER — Inpatient Hospital Stay (HOSPITAL_COMMUNITY)
Admission: AD | Admit: 2019-09-08 | Discharge: 2019-09-08 | Disposition: A | Discharge status: Home / Self Care | Payer: Medicaid Other | Attending: Obstetrics and Gynecology | Admitting: Obstetrics and Gynecology

## 2019-09-08 ENCOUNTER — Encounter (HOSPITAL_COMMUNITY): Payer: Self-pay | Admitting: Obstetrics and Gynecology

## 2019-09-08 DIAGNOSIS — Z3A13 13 weeks gestation of pregnancy: Secondary | ICD-10-CM | POA: Insufficient documentation

## 2019-09-08 DIAGNOSIS — O219 Vomiting of pregnancy, unspecified: Secondary | ICD-10-CM | POA: Insufficient documentation

## 2019-09-08 DIAGNOSIS — R109 Unspecified abdominal pain: Secondary | ICD-10-CM | POA: Insufficient documentation

## 2019-09-08 DIAGNOSIS — O99611 Diseases of the digestive system complicating pregnancy, first trimester: Secondary | ICD-10-CM | POA: Diagnosis not present

## 2019-09-08 DIAGNOSIS — O26891 Other specified pregnancy related conditions, first trimester: Secondary | ICD-10-CM | POA: Diagnosis not present

## 2019-09-08 DIAGNOSIS — K219 Gastro-esophageal reflux disease without esophagitis: Secondary | ICD-10-CM | POA: Insufficient documentation

## 2019-09-08 DIAGNOSIS — N898 Other specified noninflammatory disorders of vagina: Secondary | ICD-10-CM

## 2019-09-08 LAB — WET PREP, GENITAL
Clue Cells Wet Prep HPF POC: NONE SEEN
Sperm: NONE SEEN
Trich, Wet Prep: NONE SEEN
Yeast Wet Prep HPF POC: NONE SEEN

## 2019-09-08 LAB — URINALYSIS, ROUTINE W REFLEX MICROSCOPIC
Bilirubin Urine: NEGATIVE
Glucose, UA: NEGATIVE mg/dL
Hgb urine dipstick: NEGATIVE
Ketones, ur: 80 mg/dL — AB
Nitrite: NEGATIVE
Protein, ur: 30 mg/dL — AB
Specific Gravity, Urine: 1.031 — ABNORMAL HIGH (ref 1.005–1.030)
pH: 7 (ref 5.0–8.0)

## 2019-09-08 MED ORDER — ONDANSETRON 4 MG PO TBDP
4.0000 mg | ORAL_TABLET | Freq: Four times a day (QID) | ORAL | 2 refills | Status: DC | PRN
Start: 2019-09-08 — End: 2019-12-11

## 2019-09-08 MED ORDER — DOXYLAMINE-PYRIDOXINE 10-10 MG PO TBEC
DELAYED_RELEASE_TABLET | ORAL | 6 refills | Status: DC
Start: 2019-09-08 — End: 2020-01-14

## 2019-09-08 NOTE — [facility] Note (Signed)
. °  Kristen Dean is a 19 y.o. at [redacted]w[redacted]d here in [facility] reporting: she has been having hot flashes, increase in vomiting and states she thinks she may have a yeast infection. Also reports lower abdominal cramping LMP: 06/06/19 Onset of complaint: ongoing Pain score: 5 Vitals:   09/08/19 1829 09/08/19 1831  BP: 135/76 135/76  Pulse: 100 100  Resp:  18  Temp:  98.9 F (37.2 C)  SpO2: 96% 100%     FHT:154 Lab orders placed from triage: UA

## 2019-09-08 NOTE — [facility] Provider Note (Signed)
None     Chief Complaint:  Hot Flashes, Emesis, and Abdominal Pain   Kristen Dean is  19 y.o. [phone] at [redacted]w[redacted]d presents complaining of Hot Flashes, Emesis, and Abdominal Pain She has been having some internal/external itch/discomfort when wiping and an increase in discharge.  Nausea and vomiting is daily, but feels fine now.  Has hot flashes throughout the day, others are cold but she feels hot. Some cramping earlier today.   Obstetrical/Gynecological History: OB History    Gravida  4   Para  1   Term  1   Preterm      AB  2   Living  1     SAB  2   TAB      Ectopic      Multiple  0   Live Births  1        Obstetric Comments  Pregnancy #2 Baby Boy, Vit D Deficiency, Fetal Pyelectasis that resolved       Past Medical History: Past Medical History:  Diagnosis Date   GERD (gastroesophageal reflux disease) 08/08/2017   Medical history non-contributory     Past Surgical History: Past Surgical History:  Procedure Laterality Date   NO PAST SURGERIES      Family History: Family History  Problem Relation Age of Onset   Diabetes Mother    Diabetes Maternal Aunt    Diabetes Maternal Grandmother    Diabetes Sister    Mental retardation Sister    Diabetes Maternal Uncle    Diabetes Maternal Grandfather     Social History: Social History   Tobacco Use   Smoking status: Never Smoker   Smokeless tobacco: Never Used  Building services engineer Use: Never used  Substance Use Topics   Alcohol use: No   Drug use: No    Allergies: No Known Allergies  Meds:  Medications Prior to Admission  Medication Sig Dispense Refill Last Dose   Prenatal Vit-Fe Fumarate-FA (PREPLUS) 27-1 MG TABS Take 1 tablet by mouth daily. 30 tablet 13 Past Month at Unknown time   vitamin B-6 (PYRIDOXINE) 25 MG tablet Take 2 tablets (50 mg total) by mouth 3 (three) times daily as needed (nausea). 90 tablet 1 not taking    Review of Systems   Constitutional:  Negative for fever and chills Eyes: Negative for visual disturbances Respiratory: Negative for shortness of breath, dyspnea Cardiovascular: Negative for chest pain or palpitations  Gastrointestinal: Negative for diarrhea and constipation Genitourinary: Negative for dysuria and urgency Musculoskeletal: Negative for joint pain, myalgias.  Normal ROM  Neurological: Negative for headaches    Physical Exam  Blood pressure 135/76, pulse 100, temperature 98.9 F (37.2 C), resp. rate 18, last menstrual period 06/06/2019, SpO2 100 %, unknown if currently breastfeeding.  Weight stable over the past month GENERAL: Well-developed, well-nourished female in no acute distress.  LUNGS: Normal respiratory effort HEART: Regular rate and rhythm. ABDOMEN: Soft, nontender, nondistended EXTREMITIES: Nontender, no edema, 2+ distal pulses. DTR's 2+ PELVIC:  SSE:  Normal appearing vaginal discharge w/o odor.  Vagina normal w/o erythema or rash CERVICAL EXAM: LTC, firm FHT:  + doppler in triage  Labs: Results for orders placed or performed during the hospital encounter of 09/08/19 (from the past 24 hour(s))  Urinalysis, Routine w reflex microscopic Urine, Clean Catch   Collection Time: 09/08/19  6:44 PM  Result Value Ref Range   Color, Urine YELLOW YELLOW   APPearance HAZY (A) CLEAR   Specific Gravity, Urine 1.031 (H)  1.005 - 1.030   pH 7.0 5.0 - 8.0   Glucose, UA NEGATIVE NEGATIVE mg/dL   Hgb urine dipstick NEGATIVE NEGATIVE   Bilirubin Urine NEGATIVE NEGATIVE   Ketones, ur 80 (A) NEGATIVE mg/dL   Protein, ur 30 (A) NEGATIVE mg/dL   Nitrite NEGATIVE NEGATIVE   Leukocytes,Ua SMALL (A) NEGATIVE   RBC / HPF 0-5 0 - 5 RBC/hpf   WBC, UA 0-5 0 - 5 WBC/hpf   Bacteria, UA RARE (A) NONE SEEN   Squamous Epithelial / LPF 0-5 0 - 5   Mucus PRESENT   Wet prep, genital   Collection Time: 09/08/19  7:25 PM   Specimen: PATH Cytology Cervicovaginal Ancillary Only  Result Value Ref Range   Yeast Wet Prep HPF  POC NONE SEEN NONE SEEN   Trich, Wet Prep NONE SEEN NONE SEEN   Clue Cells Wet Prep HPF POC NONE SEEN NONE SEEN   WBC, Wet Prep HPF POC FEW (A) NONE SEEN   Sperm NONE SEEN    Imaging Studies:  No results found.  Assessment/Plan: Kristen Dean is  19 y.o. [phone] at [redacted]w[redacted]d presents with  daily n/v, none now:  Rx diclegis and zofran for home use Increased vaginal discharge w/itch/discomfort: make sure vagina is dry/stays dry.  Can use moisture barrier prn.  GC/CHL pending  [name] 9/6/20218:16 PM

## 2019-09-08 NOTE — Discharge Instructions (Signed)
First Trimester of Pregnancy The first trimester of pregnancy is from week 1 until the end of week 13 (months 1 through 3). A week after a sperm fertilizes an egg, the egg will implant on the wall of the uterus. This embryo will begin to develop into a baby. Genes from you and your partner will form the baby. The female genes will determine whether the baby will be a boy or a girl. At 6-8 weeks, the eyes and face will be formed, and the heartbeat can be seen on ultrasound. At the end of 12 weeks, all the baby's organs will be formed. Now that you are pregnant, you will want to do everything you can to have a healthy baby. Two of the most important things are to get good prenatal care and to follow your health care provider's instructions. Prenatal care is all the medical care you receive before the baby's birth. This care will help prevent, find, and treat any problems during the pregnancy and childbirth. Body changes during your first trimester Your body goes through many changes during pregnancy. The changes vary from woman to woman.  You may gain or lose a couple of pounds at first.  You may feel sick to your stomach (nauseous) and you may throw up (vomit). If the vomiting is uncontrollable, call your health care provider.  You may tire easily.  You may develop headaches that can be relieved by medicines. All medicines should be approved by your health care provider.  You may urinate more often. Painful urination may mean you have a bladder infection.  You may develop heartburn as a result of your pregnancy.  You may develop constipation because certain hormones are causing the muscles that push stool through your intestines to slow down.  You may develop hemorrhoids or swollen veins (varicose veins).  Your breasts may begin to grow larger and become tender. Your nipples may stick out more, and the tissue that surrounds them (areola) may become darker.  Your gums may bleed and may be  sensitive to brushing and flossing.  Dark spots or blotches (chloasma, mask of pregnancy) may develop on your face. This will likely fade after the baby is born.  Your menstrual periods will stop.  You may have a loss of appetite.  You may develop cravings for certain kinds of food.  You may have changes in your emotions from day to day, such as being excited to be pregnant or being concerned that something may go wrong with the pregnancy and baby.  You may have more vivid and strange dreams.  You may have changes in your hair. These can include thickening of your hair, rapid growth, and changes in texture. Some women also have hair loss during or after pregnancy, or hair that feels dry or thin. Your hair will most likely return to normal after your baby is born. What to expect at prenatal visits During a routine prenatal visit:  You will be weighed to make sure you and the baby are growing normally.  Your blood pressure will be taken.  Your abdomen will be measured to track your baby's growth.  The fetal heartbeat will be listened to between weeks 10 and 14 of your pregnancy.  Test results from any previous visits will be discussed. Your health care provider may ask you:  How you are feeling.  If you are feeling the baby move.  If you have had any abnormal symptoms, such as leaking fluid, bleeding, severe headaches, or abdominal   cramping.  If you are using any tobacco products, including cigarettes, chewing tobacco, and electronic cigarettes.  If you have any questions. Other tests that may be performed during your first trimester include:  Blood tests to find your blood type and to check for the presence of any previous infections. The tests will also be used to check for low iron levels (anemia) and protein on red blood cells (Rh antibodies). Depending on your risk factors, or if you previously had diabetes during pregnancy, you may have tests to check for high blood sugar  that affects pregnant women (gestational diabetes).  Urine tests to check for infections, diabetes, or protein in the urine.  An ultrasound to confirm the proper growth and development of the baby.  Fetal screens for spinal cord problems (spina bifida) and Down syndrome.  HIV (human immunodeficiency virus) testing. Routine prenatal testing includes screening for HIV, unless you choose not to have this test.  You may need other tests to make sure you and the baby are doing well. Follow these instructions at home: Medicines  Follow your health care provider's instructions regarding medicine use. Specific medicines may be either safe or unsafe to take during pregnancy.  Take a prenatal vitamin that contains at least 600 micrograms (mcg) of folic acid.  If you develop constipation, try taking a stool softener if your health care provider approves. Eating and drinking   Eat a balanced diet that includes fresh fruits and vegetables, whole grains, good sources of protein such as meat, eggs, or tofu, and low-fat dairy. Your health care provider will help you determine the amount of weight gain that is right for you.  Avoid raw meat and uncooked cheese. These carry germs that can cause birth defects in the baby.  Eating four or five small meals rather than three large meals a day may help relieve nausea and vomiting. If you start to feel nauseous, eating a few soda crackers can be helpful. Drinking liquids between meals, instead of during meals, also seems to help ease nausea and vomiting.  Limit foods that are high in fat and processed sugars, such as fried and sweet foods.  To prevent constipation: ? Eat foods that are high in fiber, such as fresh fruits and vegetables, whole grains, and beans. ? Drink enough fluid to keep your urine clear or pale yellow. Activity  Exercise only as directed by your health care provider. Most women can continue their usual exercise routine during  pregnancy. Try to exercise for 30 minutes at least 5 days a week. Exercising will help you: ? Control your weight. ? Stay in shape. ? Be prepared for labor and delivery.  Experiencing pain or cramping in the lower abdomen or lower back is a good sign that you should stop exercising. Check with your health care provider before continuing with normal exercises.  Try to avoid standing for long periods of time. Move your legs often if you must stand in one place for a long time.  Avoid heavy lifting.  Wear low-heeled shoes and practice good posture.  You may continue to have sex unless your health care provider tells you not to. Relieving pain and discomfort  Wear a good support bra to relieve breast tenderness.  Take warm sitz baths to soothe any pain or discomfort caused by hemorrhoids. Use hemorrhoid cream if your health care provider approves.  Rest with your legs elevated if you have leg cramps or low back pain.  If you develop varicose veins in   your legs, wear support hose. Elevate your feet for 15 minutes, 3-4 times a day. Limit salt in your diet. Prenatal care  Schedule your prenatal visits by the twelfth week of pregnancy. They are usually scheduled monthly at first, then more often in the last 2 months before delivery.  Write down your questions. Take them to your prenatal visits.  Keep all your prenatal visits as told by your health care provider. This is important. Safety  Wear your seat belt at all times when driving.  Make a list of emergency phone numbers, including numbers for family, friends, the hospital, and police and fire departments. General instructions  Ask your health care provider for a referral to a local prenatal education class. Begin classes no later than the beginning of month 6 of your pregnancy.  Ask for help if you have counseling or nutritional needs during pregnancy. Your health care provider can offer advice or refer you to specialists for help  with various needs.  Do not use hot tubs, steam rooms, or saunas.  Do not douche or use tampons or scented sanitary pads.  Do not cross your legs for long periods of time.  Avoid cat litter boxes and soil used by cats. These carry germs that can cause birth defects in the baby and possibly loss of the fetus by miscarriage or stillbirth.  Avoid all smoking, herbs, alcohol, and medicines not prescribed by your health care provider. Chemicals in these products affect the formation and growth of the baby.  Do not use any products that contain nicotine or tobacco, such as cigarettes and e-cigarettes. If you need help quitting, ask your health care provider. You may receive counseling support and other resources to help you quit.  Schedule a dentist appointment. At home, brush your teeth with a soft toothbrush and be gentle when you floss. Contact a health care provider if:  You have dizziness.  You have mild pelvic cramps, pelvic pressure, or nagging pain in the abdominal area.  You have persistent nausea, vomiting, or diarrhea.  You have a bad smelling vaginal discharge.  You have pain when you urinate.  You notice increased swelling in your face, hands, legs, or ankles.  You are exposed to fifth disease or chickenpox.  You are exposed to [place] measles (rubella) and have never had it. Get help right away if:  You have a fever.  You are leaking fluid from your vagina.  You have spotting or bleeding from your vagina.  You have severe abdominal cramping or pain.  You have rapid weight gain or loss.  You vomit blood or material that looks like coffee grounds.  You develop a severe headache.  You have shortness of breath.  You have any kind of trauma, such as from a fall or a car accident. Summary  The first trimester of pregnancy is from week 1 until the end of week 13 (months 1 through 3).  Your body goes through many changes during pregnancy. The changes vary from  woman to woman.  You will have routine prenatal visits. During those visits, your health care provider will examine you, discuss any test results you may have, and talk with you about how you are feeling. This information is not intended to replace advice given to you by your health care provider. Make sure you discuss any questions you have with your health care provider. Document Revised: 12/01/2016 Document Reviewed: 12/01/2015 Elsevier Patient Education  2020 [employer]  

## 2019-09-09 ENCOUNTER — Ambulatory Visit: Payer: Medicaid Other

## 2019-09-09 LAB — GC/CHLAMYDIA PROBE AMP (~~LOC~~) NOT AT ARMC
Chlamydia: NEGATIVE
Comment: NEGATIVE
Comment: NORMAL
Neisseria Gonorrhea: NEGATIVE

## 2019-09-13 ENCOUNTER — Other Ambulatory Visit: Payer: Self-pay

## 2019-09-13 ENCOUNTER — Inpatient Hospital Stay (HOSPITAL_COMMUNITY)
Admission: AD | Admit: 2019-09-13 | Discharge: 2019-09-13 | Disposition: A | Discharge status: Home / Self Care | Payer: Medicaid Other | Attending: Obstetrics & Gynecology | Admitting: Obstetrics & Gynecology

## 2019-09-13 ENCOUNTER — Encounter (HOSPITAL_COMMUNITY): Payer: Self-pay | Admitting: Obstetrics & Gynecology

## 2019-09-13 DIAGNOSIS — B373 Candidiasis of vulva and vagina: Secondary | ICD-10-CM

## 2019-09-13 DIAGNOSIS — O99611 Diseases of the digestive system complicating pregnancy, first trimester: Secondary | ICD-10-CM | POA: Diagnosis not present

## 2019-09-13 DIAGNOSIS — Z3A13 13 weeks gestation of pregnancy: Secondary | ICD-10-CM | POA: Diagnosis not present

## 2019-09-13 DIAGNOSIS — K219 Gastro-esophageal reflux disease without esophagitis: Secondary | ICD-10-CM | POA: Insufficient documentation

## 2019-09-13 DIAGNOSIS — O98811 Other maternal infectious and parasitic diseases complicating pregnancy, first trimester: Secondary | ICD-10-CM | POA: Insufficient documentation

## 2019-09-13 DIAGNOSIS — Z79899 Other long term (current) drug therapy: Secondary | ICD-10-CM | POA: Insufficient documentation

## 2019-09-13 DIAGNOSIS — O98819 Other maternal infectious and parasitic diseases complicating pregnancy, unspecified trimester: Secondary | ICD-10-CM

## 2019-09-13 LAB — URINALYSIS, ROUTINE W REFLEX MICROSCOPIC
Bilirubin Urine: NEGATIVE
Glucose, UA: NEGATIVE mg/dL
Hgb urine dipstick: NEGATIVE
Ketones, ur: NEGATIVE mg/dL
Nitrite: NEGATIVE
Protein, ur: 30 mg/dL — AB
Specific Gravity, Urine: 1.01 (ref 1.005–1.030)
pH: >9 — ABNORMAL HIGH (ref 5.0–8.0)

## 2019-09-13 LAB — URINALYSIS, MICROSCOPIC (REFLEX)

## 2019-09-13 MED ORDER — TERCONAZOLE 0.4 % VA CREA: 1.0000 | TOPICAL_CREAM | Freq: Every day | VAGINAL | 0 refills | Status: DC

## 2019-09-13 NOTE — Discharge Instructions (Signed)
Vaginal Yeast Infection, Adult  Vaginal yeast infection is a condition that causes vaginal discharge as well as soreness, swelling, and redness (inflammation) of the vagina. This is a common condition. Some women get this infection frequently. What are the causes? This condition is caused by a change in the normal balance of the yeast (candida) and bacteria that live in the vagina. This change causes an overgrowth of yeast, which causes the inflammation. What increases the risk? The condition is more likely to develop in women who:  Take antibiotic medicines.  Have diabetes.  Take birth control pills.  Are pregnant.  Douche often.  Have a weak body defense system (immune system).  Have been taking steroid medicines for a long time.  Frequently wear tight clothing. What are the signs or symptoms? Symptoms of this condition include:  White, thick, creamy vaginal discharge.  Swelling, itching, redness, and irritation of the vagina. The lips of the vagina (vulva) may be affected as well.  Pain or a burning feeling while urinating.  Pain during sex. How is this diagnosed? This condition is diagnosed based on:  Your medical history.  A physical exam.  A pelvic exam. Your health care provider will examine a sample of your vaginal discharge under a microscope. Your health care provider may send this sample for testing to confirm the diagnosis. How is this treated? This condition is treated with medicine. Medicines may be over-the-counter or prescription. You may be told to use one or more of the following:  Medicine that is taken by mouth (orally).  Medicine that is applied as a cream (topically).  Medicine that is inserted directly into the vagina (suppository). Follow these instructions at home:  Lifestyle  Do not have sex until your health care provider approves. Tell your sex partner that you have a yeast infection. That person should go to his or her health care  provider and ask if they should also be treated.  Do not wear tight clothes, such as pantyhose or tight pants.  Wear breathable cotton underwear. General instructions  Take or apply over-the-counter and prescription medicines only as told by your health care provider.  Eat more yogurt. This may help to keep your yeast infection from returning.  Do not use tampons until your health care provider approves.  Try taking a sitz bath to help with discomfort. This is a warm water bath that is taken while you are sitting down. The water should only come up to your hips and should cover your buttocks. Do this 3-4 times per day or as told by your health care provider.  Do not douche.  If you have diabetes, keep your blood sugar levels under control.  Keep all follow-up visits as told by your health care provider. This is important. Contact a health care provider if:  You have a fever.  Your symptoms go away and then return.  Your symptoms do not get better with treatment.  Your symptoms get worse.  You have new symptoms.  You develop blisters in or around your vagina.  You have blood coming from your vagina and it is not your menstrual period.  You develop pain in your abdomen. Summary  Vaginal yeast infection is a condition that causes discharge as well as soreness, swelling, and redness (inflammation) of the vagina.  This condition is treated with medicine. Medicines may be over-the-counter or prescription.  Take or apply over-the-counter and prescription medicines only as told by your health care provider.  Do not douche.   Do not have sex or use tampons until your health care provider approves.  Contact a health care provider if your symptoms do not get better with treatment or your symptoms go away and then return. This information is not intended to replace advice given to you by your health care provider. Make sure you discuss any questions you have with your health care  provider. Document Revised: 07/19/2018 Document Reviewed: 05/07/2017 Elsevier Patient Education  2020 [employer]vier Inc.  

## 2019-09-13 NOTE — MAU Provider Note (Signed)
History     CSN: [id number]  Arrival date and time: 09/13/19 1826   First Provider Initiated Contact with Patient 09/13/19 2019      Chief Complaint  Patient presents with  . Vaginal Bleeding   Marcie Shearon is a 19 y.o. [id number] at [redacted]w[redacted]d who receives care at [facility].  She presents today for Vaginal Spotting.  She reports she was seen a few days ago for vaginal irritation that she thought was a yeast infection.  She reports she was told she did not have any infection and was advised to use Destin on the areas of irritation.  Patient states she was using Aquaphor with no relief of her symptoms. She states last night her vagina felt swollen so she then tried Destin Maximum Strength and upon waking she tried wiping off the cream and noted some blood on the tissue.  She reports it was "like little dots of blood... like a little bit."  She denies bleeding or pain with urination. She did not notice any blood in her underwear, but did not blood upon arrival to the hospital.  She denies sexual activity in the past 3 days. Patient reports vaginal discharge, but states it is no more than her last visit.    OB History    Gravida  4   Para  1   Term  1   Preterm      AB  2   Living  1     SAB  2   TAB      Ectopic      Multiple  0   Live Births  1        Obstetric Comments  Pregnancy #2 Baby Boy, Vit D Deficiency, Fetal Pyelectasis that resolved        Past Medical History:  Diagnosis Date  . GERD (gastroesophageal reflux disease) 08/08/2017  . Medical history non-contributory     Past Surgical History:  Procedure Laterality Date  . NO PAST SURGERIES      Family History  Problem Relation Age of Onset  . Diabetes Mother   . Diabetes Maternal Aunt   . Diabetes Maternal Grandmother   . Diabetes Sister   . Mental retardation Sister   . Diabetes Maternal Uncle   . Diabetes Maternal Grandfather     Social History   Tobacco Use  . Smoking status: Never Smoker  .  Smokeless tobacco: Never Used  Vaping Use  . Vaping Use: Never used  Substance Use Topics  . Alcohol use: No  . Drug use: No    Allergies: No Known Allergies  Medications Prior to Admission  Medication Sig Dispense Refill Last Dose  . ondansetron (ZOFRAN ODT) 4 MG disintegrating tablet Take 1 tablet (4 mg total) by mouth every 6 (six) hours as needed for nausea. 30 tablet 2 Past Week at Unknown time  . Doxylamine-Pyridoxine (DICLEGIS) 10-10 MG TBEC Take 2 qhs; may also take one in am and one in afternoon prn nausea 120 tablet 6   . Prenatal Vit-Fe Fumarate-FA (PREPLUS) 27-1 MG TABS Take 1 tablet by mouth daily. 30 tablet 13 Unknown at Unknown time  . vitamin B-6 (PYRIDOXINE) 25 MG tablet Take 2 tablets (50 mg total) by mouth 3 (three) times daily as needed (nausea). 90 tablet 1     Review of Systems  Constitutional: Negative for chills and fever.  Respiratory: Negative for cough and shortness of breath.   Gastrointestinal: Positive for nausea and vomiting. Negative for constipation  and diarrhea.  Genitourinary: Positive for vaginal bleeding. Negative for difficulty urinating, dysuria and vaginal discharge.   Physical Exam   Blood pressure 111/69, pulse 69, temperature 98.6 F (37 C), temperature source Oral, resp. rate 16, height 5\' 8"  (1.727 m), weight 93.1 kg, last menstrual period 06/06/2019, SpO2 98 %, unknown if currently breastfeeding.  Physical Exam Exam conducted with a chaperone present.  Constitutional:      Appearance: Normal appearance.  HENT:     Head: Normocephalic and atraumatic.  Eyes:     Conjunctiva/sclera: Conjunctivae normal.  Cardiovascular:     Rate and Rhythm: Normal rate and regular rhythm.  Pulmonary:     Effort: Pulmonary effort is normal. No respiratory distress.  Abdominal:     General: Abdomen is flat.  Genitourinary:    Labia:        Right: No tenderness.        Left: No tenderness.      Vagina: Vaginal discharge present. No bleeding.      Cervix: No discharge or friability.     Comments: Speculum Exam: -Normal External Genitalia: Non tender, Irritation and excoriations noted.  White cream removed with gyn toilette.  -Vaginal Vault: Pink mucosa with good rugae. Appears inflammed. Moderate amt curdy white watery discharge. -Cervix:Pink, no lesions, cysts, or polyps.  Appears closed. No active bleeding from os -Bimanual Exam:  Deferred d/t irritation.   Musculoskeletal:     Cervical back: Normal range of motion.  Skin:    General: Skin is warm and dry.  Neurological:     Mental Status: She is alert and oriented to person, place, and time.  Psychiatric:        Mood and Affect: Mood normal.        Behavior: Behavior normal.        Thought Content: Thought content normal.     MAU Course  Procedures Results for orders placed or performed during the hospital encounter of 09/13/19 (from the past 24 hour(s))  Urinalysis, Routine w reflex microscopic Urine, Clean Catch     Status: Abnormal   Collection Time: 09/13/19  7:41 PM  Result Value Ref Range   Color, Urine YELLOW YELLOW   APPearance HAZY (A) CLEAR   Specific Gravity, Urine 1.010 1.005 - 1.030   pH >9.0 (H) 5.0 - 8.0   Glucose, UA NEGATIVE NEGATIVE mg/dL   Hgb urine dipstick NEGATIVE NEGATIVE   Bilirubin Urine NEGATIVE NEGATIVE   Ketones, ur NEGATIVE NEGATIVE mg/dL   Protein, ur 30 (A) NEGATIVE mg/dL   Nitrite NEGATIVE NEGATIVE   Leukocytes,Ua TRACE (A) NEGATIVE  Urinalysis, Microscopic (reflex)     Status: Abnormal   Collection Time: 09/13/19  7:41 PM  Result Value Ref Range   RBC / HPF 0-5 0 - 5 RBC/hpf   WBC, UA 11-20 0 - 5 WBC/hpf   Bacteria, UA MANY (A) NONE SEEN   Squamous Epithelial / LPF 6-10 0 - 5   MDM Pelvic Exam Labs: UA and UC Assessment and Plan  19 year old 26 SIUP at 13.5 weeks Candidiasis of Vagina  -POC Reviewed. -Patient informed no testing for STD/I would be performed.  -Exam performed and findings discussed. -Informed that  findings c/w yeast infection. -Reviewed treatment and expected irritation that may include some light spotting or bleeding. -Patient verbalized understanding without further q/c. -Urine with many bacteria and sent for culture.  -Rx for Terazol 7 sent to pharmacy on file.  -Encouraged to call or return to MAU if  symptoms worsen or with the onset of new symptoms. -Discharged to home in stable condition.   Cherre Robins 09/13/2019, 8:19 PM

## 2019-09-13 NOTE — MAU Note (Signed)
Kristen Dean is a 19 y.o. at [redacted]w[redacted]d here in MAU reporting: saw some spotting today, only sees it when she wipes. No pain currently.  Onset of complaint: today  Pain score: 0/10  Vitals:   09/13/19 1855  BP: 111/69  Pulse: 69  Resp: 16  Temp: 98.6 F (37 C)  SpO2: 98%     FHT: 161  Lab orders placed from triage: UA

## 2019-09-15 LAB — URINE CULTURE: Culture: <10000 — AB

## 2019-09-25 ENCOUNTER — Encounter: Payer: Self-pay | Admitting: Family Medicine

## 2019-09-25 ENCOUNTER — Other Ambulatory Visit: Payer: Self-pay

## 2019-09-25 ENCOUNTER — Ambulatory Visit (INDEPENDENT_AMBULATORY_CARE_PROVIDER_SITE_OTHER): Payer: Medicaid Other | Admitting: Family Medicine

## 2019-09-25 VITALS — BP 108/58 | HR 78 | Wt 208.0 lb

## 2019-09-25 DIAGNOSIS — Z3482 Encounter for supervision of other normal pregnancy, second trimester: Secondary | ICD-10-CM

## 2019-09-25 DIAGNOSIS — O219 Vomiting of pregnancy, unspecified: Secondary | ICD-10-CM

## 2019-09-25 NOTE — Progress Notes (Signed)
  Patient Name: Kristen Dean Date of Birth: [birth year] [facility] Prenatal Visit  [name] is a 19 y.o. [phone] at [redacted]w[redacted]d here for routine follow up. She is dated by early ultrasound.  She reports nausea.  She denies vaginal bleeding.  See flow sheet for details.   She has been seen at MAU x2 since last visit on 8/19, both for vaginal discharge.  She was diagnosed with yeast infection at last MAU visit.  Doing well now.  Still having some nausea.  Vomits at least once a day, always after eating.  "Usually just spit, sometimes it is a yellow fluid."  Had it during the whole pregnancy during her last pregnancy.  Isn't sure if certain foods make it worse and can't think of what she eats.  No RUQ pain.  No headaches, changes in vision.  No leg swelling.    Plans to feed with formula, still thinking about trying breast milk.  Contraception: POPs  Declines genetic screening  She is taking her prenatal vitamin  Vitals:   09/25/19 1155  BP: (!) 108/58  Pulse: 78     A/P: Pregnancy at [redacted]w[redacted]d.  Doing well.    1. Routine Prenatal Care:  Marland Kitchen Dating reviewed, dating tab is correct . Fetal heart tones Appropriate, checked via [place] at 130-140  . Influenza vaccine not administered as patient declined, will continue to discuss.   Marland Kitchen COVID vaccination was discussed and declined---discussed at length .  . Prior early 1 hour normal.  . Anatomy ultrasound scheduled at 18-20 weeks, for 10/23/2019. Marland Kitchen Patient is not interested in genetic screening. . Pregnancy education including expected weight gain in pregnancy, OTC medication use, continued use of prenatal vitamin, smoking cessation if applicable, and nutrition in pregnancy.   . Bleeding and pain precautions reviewed. . Counseled about nausea  . Discussed pap smear--- she will think about as follow up.   2. Pregnancy issues include the following and were addressed as appropriate today: . Obesity o Passed early 1 hr GTT  will need screening for gestational diabetes at 24-28 weeks  o Weight gain is appropriate.  Expected pregnancy weight gain is 11-20 lbs. . Problem list  and pregnancy box updated: Yes.   Follow up 4 weeks.   Patient seen and examined with Dr. Obie Dredge and Dr. Idalia Needle.   I discussed the plan of care with the resident physician and agree with below documentation.  Terisa Starr, MD

## 2019-09-25 NOTE — Patient Instructions (Signed)
Thank you for coming to see me today. It was a pleasure. Today we talked about:   Continue to take your prenatal vitamins.  Please follow-up with your PCP as scheduled below.  If you have any questions or concerns, please do not hesitate to call the office at [phone].  Best,   Luis Abed, DO   COVID-19 Vaccine Information can be found at: [email or web address] For questions related to vaccine distribution or appointments, please email [email or web address] or call [phone].    Second Trimester of Pregnancy  The second trimester is from week 14 through week 27 (month 4 through 6). This is often the time in pregnancy that you feel your best. Often times, morning sickness has lessened or quit. You may have more energy, and you may get hungry more often. Your unborn baby is growing rapidly. At the end of the sixth month, he or she is about 9 inches long and weighs about 1 pounds. You will likely feel the baby move between 18 and 20 weeks of pregnancy. Follow these instructions at home: Medicines  Take over-the-counter and prescription medicines only as told by your doctor. Some medicines are safe and some medicines are not safe during pregnancy.  Take a prenatal vitamin that contains at least 600 micrograms (mcg) of folic acid.  If you have trouble pooping (constipation), take medicine that will make your stool soft (stool softener) if your doctor approves. Eating and drinking   Eat regular, healthy meals.  Avoid raw meat and uncooked cheese.  If you get low calcium from the food you eat, talk to your doctor about taking a daily calcium supplement.  Avoid foods that are high in fat and sugars, such as fried and sweet foods.  If you feel sick to your stomach (nauseous) or throw up (vomit): ? Eat 4 or 5 small meals a day instead of 3 large meals. ? Try eating a few soda crackers. ? Drink liquids between  meals instead of during meals.  To prevent constipation: ? Eat foods that are high in fiber, like fresh fruits and vegetables, whole grains, and beans. ? Drink enough fluids to keep your pee (urine) clear or pale yellow. Activity  Exercise only as told by your doctor. Stop exercising if you start to have cramps.  Do not exercise if it is too hot, too humid, or if you are in a place of great height (high altitude).  Avoid heavy lifting.  Wear low-heeled shoes. Sit and stand up straight.  You can continue to have sex unless your doctor tells you not to. Relieving pain and discomfort  Wear a good support bra if your breasts are tender.  Take warm water baths (sitz baths) to soothe pain or discomfort caused by hemorrhoids. Use hemorrhoid cream if your doctor approves.  Rest with your legs raised if you have leg cramps or low back pain.  If you develop puffy, bulging veins (varicose veins) in your legs: ? Wear support hose or compression stockings as told by your doctor. ? Raise (elevate) your feet for 15 minutes, 3-4 times a day. ? Limit salt in your food. Prenatal care  Write down your questions. Take them to your prenatal visits.  Keep all your prenatal visits as told by your doctor. This is important. Safety  Wear your seat belt when driving.  Make a list of emergency phone numbers, including numbers for family, friends, the hospital, and police and fire departments. General instructions  Ask your doctor about the right foods  to eat or for help finding a counselor, if you need these services.  Ask your doctor about local prenatal classes. Begin classes before month 6 of your pregnancy.  Do not use hot tubs, steam rooms, or saunas.  Do not douche or use tampons or scented sanitary pads.  Do not cross your legs for long periods of time.  Visit your dentist if you have not done so. Use a soft toothbrush to brush your teeth. Floss gently.  Avoid all smoking, herbs, and  alcohol. Avoid drugs that are not approved by your doctor.  Do not use any products that contain nicotine or tobacco, such as cigarettes and e-cigarettes. If you need help quitting, ask your doctor.  Avoid cat litter boxes and soil used by cats. These carry germs that can cause birth defects in the baby and can cause a loss of your baby (miscarriage) or stillbirth. Contact a doctor if:  You have mild cramps or pressure in your lower belly.  You have pain when you pee (urinate).  You have bad smelling fluid coming from your vagina.  You continue to feel sick to your stomach (nauseous), throw up (vomit), or have watery poop (diarrhea).  You have a nagging pain in your belly area.  You feel dizzy. Get help right away if:  You have a fever.  You are leaking fluid from your vagina.  You have spotting or bleeding from your vagina.  You have severe belly cramping or pain.  You lose or gain weight rapidly.  You have trouble catching your breath and have chest pain.  You notice sudden or extreme puffiness (swelling) of your face, hands, ankles, feet, or legs.  You have not felt the baby move in over an hour.  You have severe headaches that do not go away when you take medicine.  You have trouble seeing. Summary  The second trimester is from week 14 through week 27 (months 4 through 6). This is often the time in pregnancy that you feel your best.  To take care of yourself and your unborn baby, you will need to eat healthy meals, take medicines only if your doctor tells you to do so, and do activities that are safe for you and your baby.  Call your doctor if you get sick or if you notice anything unusual about your pregnancy. Also, call your doctor if you need help with the right food to eat, or if you want to know what activities are safe for you. This information is not intended to replace advice given to you by your health care provider. Make sure you discuss any questions you  have with your health care provider. Document Revised: 04/12/2018 Document Reviewed: 01/25/2016 Elsevier Patient Education  2020 [employer].

## 2019-09-25 NOTE — Progress Notes (Signed)
Kristen Dean is a 19 year old [id number] at [redacted]w[redacted]d presenting for routine care.  Vital signs, exam--as below. No concerns.  Persistent nausea and vomiting, discussed minimizing GERD and constipation. Encouraged dietary changes---she is very busy with 19 year old, has social stressors regarding dietary changes. Encouraged small, frequent meals.   Obesity affecting pregnancy- repeat 1 hour at 24-28 weeks  Continue to discuss COVID and influenza vaccine Addressed Pap smear---consider at follow up. The patient just turned 19 years of age.

## 2019-10-20 ENCOUNTER — Ambulatory Visit: Payer: Self-pay | Admitting: *Deleted

## 2019-10-20 NOTE — Telephone Encounter (Signed)
   [phone] CB pt is pregnant and says her baby is not moving and now she is really upset and wants to know when she is supposed to feel normal movement or if should be panicky. Does have prenatal care.. but someone has gotten her upset today it seems...      C/o not feeling baby move yet. Patient is [redacted] weeks pregnant EDD: 03/15/2020. Nausea reported no vomiting. Denies abdominal pain, cramping, bleeding. Reported chest pain prior to now and went away. Patient concerned because she has not felt the baby move. Educated patient between 47 and 20 weeks she could start to feel some movements. Encouraged patient to go to [facility] tomorrow and follow up if she is still concerned of no fetal movements. Care advise given. Patient verbalized understanding and to call back or go to LD if symptoms worsen.   Reason for Disposition . [1] Baby moving less today AND [2] less than [redacted] weeks pregnant  Answer Assessment - Initial Assessment Questions 1. PREGNANCY: "Do you know how many weeks or months pregnant you are?"      19 weeks 2. EDD: "What date are you expecting to deliver?"     03/15/2020 3. BLOOD PRESSURE: "Do you have high blood pressure before or during this pregnancy?" (Note to Triager: High blood pressure may elevate acuity with minor symptoms)     no 4. PLACENTA LOCATION: "Is your placenta abnormally placed?" (Note to Triager: Placenta previa patients have higher risk for severe bleeding and should be sent in now for any bleeding)     No  5. MAIN SYMPTOM: "What are you most concerned about?" When did it start?"     Have not starting feeling baby move alot 6. VAGINAL BLEEDING: If present, ask: "How bad is it?" (Mild, Moderate or Severe. See Severity definitions).     no 7. ABDOMINAL PAIN: If present, ask: "How bad is it?" (Mild, Moderate or Severe. See Severity definitions).     None  8. VOMITING: If present, ask: "How bad is it?" (Mild, Moderate or Severe. See Severity definitions).     No  vomiting only nausea 9. LEG SWELLING (EDEMA): If present, ask: "How bad is it?" (Mild, Moderate or Severe. See Severity definitions).     na 10. FEVER: "Do you have a fever?" If so, ask: "What is it, how was it measured, and when did it start?"        no 11. OTHER SYMPTOMS: "Do you have any other symptoms?" (e.g., vision changes, chest pain, trouble breathing)       Chest pain at times but it goes away  Protocols used: PREGNANCY QUESTIONS-P-AH

## 2019-10-23 ENCOUNTER — Ambulatory Visit: Payer: Medicaid Other

## 2019-10-23 ENCOUNTER — Encounter: Payer: Medicaid Other | Admitting: Family Medicine

## 2019-10-29 ENCOUNTER — Other Ambulatory Visit: Payer: Self-pay

## 2019-10-29 ENCOUNTER — Inpatient Hospital Stay (HOSPITAL_COMMUNITY)
Admission: AD | Admit: 2019-10-29 | Discharge: 2019-10-29 | Disposition: A | Discharge status: Home / Self Care | Payer: Medicaid Other | Attending: Obstetrics & Gynecology | Admitting: Obstetrics & Gynecology

## 2019-10-29 ENCOUNTER — Encounter (HOSPITAL_COMMUNITY): Payer: Self-pay | Admitting: Obstetrics & Gynecology

## 2019-10-29 DIAGNOSIS — R109 Unspecified abdominal pain: Secondary | ICD-10-CM | POA: Diagnosis not present

## 2019-10-29 DIAGNOSIS — Z3A20 20 weeks gestation of pregnancy: Secondary | ICD-10-CM | POA: Diagnosis not present

## 2019-10-29 DIAGNOSIS — E860 Dehydration: Secondary | ICD-10-CM | POA: Insufficient documentation

## 2019-10-29 DIAGNOSIS — O26892 Other specified pregnancy related conditions, second trimester: Secondary | ICD-10-CM

## 2019-10-29 LAB — URINALYSIS, ROUTINE W REFLEX MICROSCOPIC
Bilirubin Urine: NEGATIVE
Glucose, UA: NEGATIVE mg/dL
Hgb urine dipstick: NEGATIVE
Ketones, ur: 5 mg/dL — AB
Leukocytes,Ua: NEGATIVE
Nitrite: NEGATIVE
Protein, ur: NEGATIVE mg/dL
Specific Gravity, Urine: 1.024 (ref 1.005–1.030)
pH: 7 (ref 5.0–8.0)

## 2019-10-29 NOTE — MAU Provider Note (Addendum)
History     CSN: [id number]  Arrival date and time: 10/29/19 1412   First Provider Initiated Contact with Patient 10/29/19 1609      Chief Complaint  Patient presents with  . Abdominal Pain   HPI  Kristen Dean is a 19 year old G4P1021 at [redacted]w[redacted]d who presents today with lower abdominal cramping and pain. She reports that this began around 12AM last night. She feels them most strongly in her mid lower abdomen. They last for approximately 10-20 minutes and occur at irregular times. Since they began their intensity have improved. She denies any relation to her position or movement. Endorses nausea but states that this has been persistent throughout her pregnancy. She denies any vaginal discharge, bleeding, clear fluid, mucus, or blood clots. She reports limited fluid intake over the last week, at best 3-4 cups/day.  She has routine prenatal care and denies any problems that have come up during these visits.  She denies fever, body aches, chills, vomiting, diarrhea, constipation, bloody stools, HAs, changes in vision, or rib pain.  OB History    Gravida  4   Para  1   Term  1   Preterm      AB  2   Living  1     SAB  2   TAB      Ectopic      Multiple  0   Live Births  1        Obstetric Comments  Pregnancy #2 Baby Boy, Vit D Deficiency, Fetal Pyelectasis that resolved        Past Medical History:  Diagnosis Date  . GERD (gastroesophageal reflux disease) 08/08/2017  . Medical history non-contributory     Past Surgical History:  Procedure Laterality Date  . NO PAST SURGERIES      Family History  Problem Relation Age of Onset  . Diabetes Mother   . Diabetes Maternal Aunt   . Diabetes Maternal Grandmother   . Diabetes Sister   . Mental retardation Sister   . Diabetes Maternal Uncle   . Diabetes Maternal Grandfather     Social History   Tobacco Use  . Smoking status: Never Smoker  . Smokeless tobacco: Never Used  Vaping Use  . Vaping Use:  Never used  Substance Use Topics  . Alcohol use: No  . Drug use: No    Allergies: No Known Allergies  No medications prior to admission.    Review of Systems  Constitutional: Negative for chills and fever.  HENT: Negative for congestion and sore throat.   Eyes: Negative for pain, discharge and visual disturbance.  Respiratory: Negative for cough, chest tightness, shortness of breath and wheezing.   Cardiovascular: Negative for chest pain and palpitations.  Gastrointestinal: Positive for nausea. Negative for abdominal pain, constipation, diarrhea and vomiting.  Genitourinary: Negative for dysuria, hematuria, vaginal bleeding, vaginal discharge and vaginal pain.  Skin: Negative for rash.  Neurological: Negative for dizziness and headaches.   Physical Exam   Blood pressure 117/62, pulse 71, temperature 98.7 F (37.1 C), resp. rate 18, height 5\' 8"  (1.727 m), weight 94.8 kg, last menstrual period 06/06/2019, unknown if currently breastfeeding.  Physical Exam Constitutional:      General: She is not in acute distress.    Appearance: She is well-developed.  HENT:     Head: Normocephalic.  Cardiovascular:     Rate and Rhythm: Normal rate and regular rhythm.     Heart sounds: No murmur heard.  Pulmonary:     Effort: Pulmonary effort is normal.     Breath sounds: Normal breath sounds.  Abdominal:     General: Abdomen is flat. Bowel sounds are normal.     Palpations: Abdomen is soft.     Tenderness: There is abdominal tenderness in the suprapubic area.  Genitourinary:    Comments: Pt declined. Skin:    General: Skin is warm and dry.  Neurological:     General: No focal deficit present.     Mental Status: She is alert and oriented to person, place, and time.     Cranial Nerves: No cranial nerve deficit.  Psychiatric:        Mood and Affect: Mood normal.        Behavior: Behavior normal.     MAU Course  Procedures  No results found for this or any previous visit  (from the past 24 hour(s)).  MDM -Unlikely round ligament pain given lack of aggravation by position or movement -Unlikely preterm labor as patient is comfortable and in no pain. However, declined cervical exam because she is seeing her prenatal care provider tomorrow for CVE and anatomy US. -UA with ketones and SG at 1.024  Assessment and Plan  Kristen Dean is a 19 year old G4P1021 at 56 w2d presenting for lower midabdominal cramping since last night.  Most likely due to mild dehydration indicated by elevated SG and ketones on UA as well as reported poor fluid intake. Counseled pt on increased fluid intake necessary during pregnancy, around 8-12 cups per day. There may be some component of Braxton-Hicks contractions as well. Pt reports similar cramping pain around this time in her prior pregnancy.  Educated on round ligament pain. Though this is unlikely to be the cause of her crampy pain she may experience this in the future.  [name] 10/30/2019, 4:07 PM    Student attestation:  I have seen and examined this patient and agree with above documentation in the medical student's note.   Kristen Dean is a 19 y.o. [phone] female reporting lower abdominal cramping; acute onset. The pain woke her up in the middle of the night. The pain is in the middle of her abdomen and the pain comes and goes. She has not taken anything for the pain.   Associated symptoms:  Denies fever and chills Positive abdominal pain Denies vaginal bleeding Denies vaginal discharge Denies urinary complaints Denies  GI complaints  PE: Patient Vitals for the past 24 hrs:  BP Pulse  10/29/19 1718 117/62 71   Gen: calm comfortable, NAD Resp: normal effort, no distress Heart: Regular rate Abd: Soft, NT, Pos BS x 4 Neuro: A&O x 4   ROS, labs, PMH rviewed  Orders Placed This Encounter  Procedures  . Urinalysis, Routine w reflex microscopic Urine, Clean Catch  . Discharge patient   No  orders of the defined types were placed in this encounter.   MDM  UA shows mild dehydration Patient refused cervix check. Reports she has an appointment tomorrow 10/28  Assessment 1. Abdominal pain in pregnancy, second trimester   2. Mild dehydration   3. [redacted] weeks gestation of pregnancy     Plan: - Discharge home in stable condition. - return precautionsprecautions. - Follow-up as scheduled at your doctor's office or sooner as needed if symptoms worsen. - Return to maternity admissions symptoms worsen - Encouraged more water intake.  Venia Carbon I, NP 10/30/2019 4:07 PM

## 2019-10-29 NOTE — MAU Note (Signed)
Pt reports she has had cramping on and off during the night. Still hurting today. Denies any vag bleeding or discharge.

## 2019-10-30 ENCOUNTER — Other Ambulatory Visit: Payer: Self-pay

## 2019-10-30 ENCOUNTER — Encounter: Payer: Self-pay | Admitting: Family Medicine

## 2019-10-30 ENCOUNTER — Ambulatory Visit (INDEPENDENT_AMBULATORY_CARE_PROVIDER_SITE_OTHER): Payer: Medicaid Other | Admitting: Family Medicine

## 2019-10-30 ENCOUNTER — Ambulatory Visit: Payer: Medicaid Other | Attending: Family Medicine

## 2019-10-30 ENCOUNTER — Other Ambulatory Visit: Payer: Self-pay | Admitting: Family Medicine

## 2019-10-30 VITALS — BP 110/62 | HR 76 | Wt 209.4 lb

## 2019-10-30 DIAGNOSIS — Z3491 Encounter for supervision of normal pregnancy, unspecified, first trimester: Secondary | ICD-10-CM

## 2019-10-30 DIAGNOSIS — O99211 Obesity complicating pregnancy, first trimester: Secondary | ICD-10-CM

## 2019-10-30 DIAGNOSIS — Z3480 Encounter for supervision of other normal pregnancy, unspecified trimester: Secondary | ICD-10-CM

## 2019-10-30 NOTE — Patient Instructions (Signed)

## 2019-10-30 NOTE — Progress Notes (Signed)
  Patient Name: [name] Date of Birth: 05/02/2000 [facility] Prenatal Visit  Kristen Dean is a 19 y.o. [phone] at [redacted]w[redacted]d here for routine follow up. She is dated by early ultrasound.  She reports no complaints.  She denies vaginal bleeding.  See flow sheet for details. N/v still that is improving.   Vitals:   10/30/19 1546  BP: 110/62  Pulse: 76   Would like to plan for epidural as labor pain control  Prefers POP for contraception afterwards   A/P: Pregnancy at [redacted]w[redacted]d.  Doing well.    1. Routine Prenatal Care:  Marland Kitchen Dating reviewed, dating tab is correct . Fetal heart tones Appropriate . Influenza vaccine not administered as patient declined, will continue to discuss.   Marland Kitchen COVID vaccination was discussed and patient would like to continue to consider, declines for today's visit.  . The patient has the following indication for screening preexisting diabetes: BMI > 25 and sedentary lifestyle. . Anatomy ultrasound ordered to be scheduled at 18-20 weeks. . Patient is not interested in genetic screening. . Pregnancy education including expected weight gain in pregnancy, OTC medication use, continued use of prenatal vitamin, smoking cessation if applicable, and nutrition in pregnancy.   . Patient will need Pap smear at next visit as well as glucola testing.  . Bleeding and pain precautions reviewed.  2. Pregnancy issues include the following and were addressed as appropriate today:  . Need for pap smear for cervical cancer screening.  . F/u in 4 weeks with glucola testing.  . Problem list  and pregnancy box updated: Yes.   Follow up 4 weeks.

## 2019-10-31 ENCOUNTER — Other Ambulatory Visit: Payer: Self-pay | Admitting: *Deleted

## 2019-10-31 DIAGNOSIS — Z362 Encounter for other antenatal screening follow-up: Secondary | ICD-10-CM

## 2019-11-01 ENCOUNTER — Inpatient Hospital Stay (HOSPITAL_COMMUNITY)
Admission: AD | Admit: 2019-11-01 | Discharge: 2019-11-01 | Disposition: A | Discharge status: Home / Self Care | Payer: Medicaid Other | Attending: Obstetrics & Gynecology | Admitting: Obstetrics & Gynecology

## 2019-11-01 ENCOUNTER — Encounter (HOSPITAL_COMMUNITY): Payer: Self-pay | Admitting: Obstetrics & Gynecology

## 2019-11-01 DIAGNOSIS — W010XXA Fall on same level from slipping, tripping and stumbling without subsequent striking against object, initial encounter: Secondary | ICD-10-CM | POA: Diagnosis not present

## 2019-11-01 DIAGNOSIS — Y92002 Bathroom of unspecified non-institutional (private) residence single-family (private) house as the place of occurrence of the external cause: Secondary | ICD-10-CM | POA: Diagnosis not present

## 2019-11-01 DIAGNOSIS — Y92009 Unspecified place in unspecified non-institutional (private) residence as the place of occurrence of the external cause: Secondary | ICD-10-CM | POA: Diagnosis not present

## 2019-11-01 DIAGNOSIS — Z3A20 20 weeks gestation of pregnancy: Secondary | ICD-10-CM | POA: Diagnosis not present

## 2019-11-01 DIAGNOSIS — O219 Vomiting of pregnancy, unspecified: Secondary | ICD-10-CM | POA: Insufficient documentation

## 2019-11-01 NOTE — MAU Note (Signed)
Pt reports she had nausea and vomiting this morning. tried to get to the  Br. Vomited a little on the floor and her son was in the way after her moved out of the way she slipped on the emisis and fel on her back. Left leg hit the wall and  Right arm hit the floor hard. Arm hurts a little swelling near elbow. Reports some stomach tightening after the fall. Denies any vag bleeding or discharge.

## 2019-11-01 NOTE — Discharge Instructions (Signed)
Call the office if you are having any problems - they can schedule an extra appointment for you. Return to MAU if you have vaginal bleeding, cramping with a hard lower abdomen, or severe headache. Take the Diclegis for vomiting - one at night, one in the morning, can add up to 2 at night, one in the morning and one in the afternoon if needed.

## 2019-11-01 NOTE — MAU Provider Note (Signed)
History     CSN: [id number]  Arrival date and time: 11/01/19 1332   First Provider Initiated Contact with Patient 11/01/19 1405      Chief Complaint  Patient presents with  . Fall   HPI Kristen Dean 19 y.o. [redacted]w[redacted]d Comes to MAU after slipping in the bathroom at home and falling flat on her back.  She did fall flat and did bump the back of her head but reports it did not hurt and does not hurt now.  No areas of pain now on her back.  Slipped on her own vomitus as she did not make it to the toilet.  Thought her vomiting had improved and has not been taking any medication for her vomiting that has returned.  OB History    Gravida  4   Para  1   Term  1   Preterm      AB  2   Living  1     SAB  2   TAB      Ectopic      Multiple  0   Live Births  1        Obstetric Comments  Pregnancy #2 Baby Boy, Vit D Deficiency, Fetal Pyelectasis that resolved        Past Medical History:  Diagnosis Date  . GERD (gastroesophageal reflux disease) 08/08/2017  . Medical history non-contributory     Past Surgical History:  Procedure Laterality Date  . NO PAST SURGERIES      Family History  Problem Relation Age of Onset  . Diabetes Mother   . Diabetes Maternal Aunt   . Diabetes Maternal Grandmother   . Diabetes Sister   . Mental retardation Sister   . Diabetes Maternal Uncle   . Diabetes Maternal Grandfather     Social History   Tobacco Use  . Smoking status: Never Smoker  . Smokeless tobacco: Never Used  Vaping Use  . Vaping Use: Never used  Substance Use Topics  . Alcohol use: No  . Drug use: No    Allergies: No Known Allergies  Medications Prior to Admission  Medication Sig Dispense Refill Last Dose  . Doxylamine-Pyridoxine (DICLEGIS) 10-10 MG TBEC Take 2 qhs; may also take one in am and one in afternoon prn nausea 120 tablet 6 Past Month at Unknown time  . ondansetron (ZOFRAN ODT) 4 MG disintegrating tablet Take 1 tablet (4 mg total) by mouth  every 6 (six) hours as needed for nausea. 30 tablet 2 Past Month at Unknown time  . Prenatal Vit-Fe Fumarate-FA (PREPLUS) 27-1 MG TABS Take 1 tablet by mouth daily. 30 tablet 13 Past Week at Unknown time  . terconazole (TERAZOL 7) 0.4 % vaginal cream Place 1 applicator vaginally at bedtime. 45 g 0     Review of Systems  Constitutional: Negative for fever.  Respiratory: Negative for cough and shortness of breath.   Gastrointestinal: Negative for abdominal pain.  Genitourinary: Negative for dysuria, vaginal bleeding and vaginal discharge.  Neurological: Negative for syncope, light-headedness and headaches.   Physical Exam   Blood pressure (!) 112/54, pulse 77, temperature 98.3 F (36.8 C), resp. rate 18, last menstrual period 06/06/2019, unknown if currently breastfeeding.  Physical Exam Vitals and nursing note reviewed.  Constitutional:      Appearance: She is well-developed.  HENT:     Head: Normocephalic.  Abdominal:     Palpations: Abdomen is soft.     Tenderness: There is no abdominal tenderness. There is  no guarding or rebound.     Comments: Fundus just above the umbilicus, soft and nontender  Musculoskeletal:        General: Normal range of motion.     Cervical back: Neck supple.     Comments: No bruising or injuries noted  Skin:    General: Skin is warm and dry.  Neurological:     Mental Status: She is alert and oriented to person, place, and time.     MAU Course  Procedures  MDM Reviewed starting Diclegis again for vomiting. Reviewed worrisome symptoms and to return to MAU if any occur.  Assessment and Plan  Fall, first encounter Nausea and vomiting  Plan Call the office if you are having any problems - they can schedule an extra appointment for you. Return to MAU if you have vaginal bleeding, cramping with a hard lower abdomen, or severe headache. Take the Diclegis for vomiting - one at night, one in the morning, can add up to 2 at night, one in the morning  and one in the afternoon if needed  Lucius Wise L Delara Shepheard 11/01/2019, 2:19 PM

## 2019-11-08 DIAGNOSIS — Z20822 Contact with and (suspected) exposure to covid-19: Secondary | ICD-10-CM | POA: Diagnosis not present

## 2019-11-11 ENCOUNTER — Other Ambulatory Visit: Payer: Self-pay

## 2019-11-11 ENCOUNTER — Ambulatory Visit: Payer: Medicaid Other

## 2019-11-11 ENCOUNTER — Ambulatory Visit (INDEPENDENT_AMBULATORY_CARE_PROVIDER_SITE_OTHER): Payer: Medicaid Other | Admitting: Family Medicine

## 2019-11-11 DIAGNOSIS — R0789 Other chest pain: Secondary | ICD-10-CM | POA: Diagnosis not present

## 2019-11-11 DIAGNOSIS — R079 Chest pain, unspecified: Secondary | ICD-10-CM | POA: Insufficient documentation

## 2019-11-11 DIAGNOSIS — J069 Acute upper respiratory infection, unspecified: Secondary | ICD-10-CM

## 2019-11-11 HISTORY — DX: Chest pain, unspecified: R07.9

## 2019-11-11 NOTE — Progress Notes (Signed)
SUBJECTIVE:   CHIEF COMPLAINT / HPI:   Nasal congestion, sore throat, cough: Kristen Dean is a 19 y.o. pregnant female at 22 weeks, 1 day presenting with sore throat x 1 day and congestion for 4 days. Complains of: headache, runny nose, cough. Denies fever, dyspnea, no diarrhea, some vomiting but says it's at same frequency as her normal morning sickness, vomits maybe 2x per day. Only known sick contacts is her son. Had her Covid19 testing on Saturday Nov 6th and it was negative (she presents this on her phone).   Chest pain Patient endorses chest pain since about the first day of her symptoms which do seem to be worse with coughing.  She also thinks that they may be worse on days where she has to carry her son more.  She denies any shortness of breath associated with these chest pains.  States that they seem to last for around a few minutes.  Sometimes they are worse with palpation and sometimes not.  She denies any nausea or vomiting associated with this chest pains.  PERTINENT  PMH / PSH: Current pregnancy  OBJECTIVE:   BP 90/60   Pulse 100   Temp 98.7 F (37.1 C)   LMP 06/06/2019 (Exact Date)    Fetal heart tones 144.  General: NAD, pleasant, able to participate in exam, no pharyngeal erythema. Cardiac: RRR, no murmurs. Respiratory: No increased work of breathing, clear to auscultation bilaterally, no wheezing. Extremities: no edema or cyanosis, no pain on palpation of the calf musculature, calves of equal size. Neuro: alert, no obvious focal deficits Psych: Normal affect and mood  ASSESSMENT/PLAN:   Viral URI with cough Assessment: 19 year old G4P1021 at [redacted]W[redacted]D presenting with 4 to 5 days of cough, congestion, sore throat.  Patient is also continue to have some vomiting which she states is at the same interval as her normal morning sickness.  Denies fevers or diarrhea as well as shortness of breath.  Patient does endorse some chest pains that started since her illness  began but thinks this may be due to carrying her son or due to the excess coughing.  These do not seem to be aggravated by activity but are worsened at times with coughing.  Overall differential is likely due to a viral upper respiratory infection.  This can include any number of respiratory viruses such as the common cold, influenza, or COVID-19.  The patient was tested for COVID-19 on the first day of her symptoms and was negative.  However her son was tested for COVID-19 on 10/26 for a viral illness which resolved and then about 1 week later he got symptoms of a new viral illness.  There is a possibility that the patient's mother is symptomatic from his first viral illness and that his second viral illness could be COVID-19 which she may not yet have tested positive from when she had her test on Saturday.  Because of this we will have the patient's son tested for COVID-19 before she is able to go back to work. Plan: -We will have the patient son tested for COVID-19 prior to her going back to work -Provided her with a note for work stating that she should not return until the results of her son's COVID-19 testing result -Provided return precautions -We will have patient schedule a follow-up appointment in about 7 days for prenatal visit.  Chest pain, unspecified Assessment: 19 year old female at 22-week 1 day pregnancy presenting with chest pain since the start of her viral  illness.  These occur daily and last for about 5 minutes each time they occur.  They are not associated with shortness of breath or vomiting.  They are associated with her increase in coughing and are worse when she is coughing.  She also states that she has been caring her son more lately as he has been ill with a viral infection and this may be exacerbating them.  Due to her age low suspicion for cardiac etiology though in pregnancy she does have an increased risk for pulmonary embolism.  This is lower on my differential due to no  lower leg swelling, no shortness of breath, pulse ox of 94% on our encounter today.  Patient also has a history of anxiety which could be part of the cause of her chest pain.  Overall reassured by physical exam.  Will have patient follow-up in about 7 days for regular prenatal work. Plan: -Return precautions provided -Follow-up scheduled for about 7 days -Should the patient develop any shortness of breath, fevers, or other concerning symptoms she will go to the emergency department.     [name], DO [facility]    This note was prepared using Dragon voice recognition software and may include unintentional dictation errors due to the inherent limitations of voice recognition software.

## 2019-11-11 NOTE — Assessment & Plan Note (Signed)
Assessment: 19 year old G4P1021 at [redacted]W[redacted]D presenting with 4 to 5 days of cough, congestion, sore throat.  Patient is also continue to have some vomiting which she states is at the same interval as her normal morning sickness.  Denies fevers or diarrhea as well as shortness of breath.  Patient does endorse some chest pains that started since her illness began but thinks this may be due to carrying her son or due to the excess coughing.  These do not seem to be aggravated by activity but are worsened at times with coughing.  Overall differential is likely due to a viral upper respiratory infection.  This can include any number of respiratory viruses such as the common cold, influenza, or COVID-19.  The patient was tested for COVID-19 on the first day of her symptoms and was negative.  However her son was tested for COVID-19 on 10/26 for a viral illness which resolved and then about 1 week later he got symptoms of a new viral illness.  There is a possibility that the patient's mother is symptomatic from his first viral illness and that his second viral illness could be COVID-19 which she may not yet have tested positive from when she had her test on Saturday.  Because of this we will have the patient's son tested for COVID-19 before she is able to go back to work. Plan: -We will have the patient son tested for COVID-19 prior to her going back to work -Provided her with a note for work stating that she should not return until the results of her son's COVID-19 testing result -Provided return precautions -We will have patient schedule a follow-up appointment in about 7 days for prenatal visit.

## 2019-11-11 NOTE — Assessment & Plan Note (Addendum)
Assessment: 19 year old female at 22-week 1 day pregnancy presenting with chest pain since the start of her viral illness.  These occur daily and last for about 5 minutes each time they occur.  They are not associated with shortness of breath or vomiting.  They are associated with her increase in coughing and are worse when she is coughing.  She also states that she has been caring her son more lately as he has been ill with a viral infection and this may be exacerbating them.  Due to her age low suspicion for cardiac etiology though in pregnancy she does have an increased risk for pulmonary embolism.  This is lower on my differential due to no lower leg swelling, no shortness of breath, pulse ox of 94% on our encounter today.  Patient also has a history of anxiety which could be part of the cause of her chest pain.  Overall reassured by physical exam.  Will have patient follow-up in about 7 days for regular prenatal work. Plan: -Return precautions provided -Follow-up scheduled for about 7 days -Should the patient develop any shortness of breath, fevers, or other concerning symptoms she will go to the emergency department.

## 2019-11-11 NOTE — Patient Instructions (Signed)
It was great to see you!  Our plans for today:  -Today we discussed your cough and sore throat for the past few days. Even though you had a negative Covid test on Saturday there is still the possibility that your son has Covid and so I would like for him to be tested and have a negative test before you return to work. I can give you a note which says you should stay out of work until the results of his test come back. -I would like to schedule follow-up appointment for Monday or Tuesday next week just to make sure you are continuing to do well. -If you develop any difficulty breathing, high fevers, excess vomitings or you are unable to hold down food or fluid please return or go to the emergency department.   Take care and seek immediate care sooner if you develop any concerns.   Dr. [facility]

## 2019-11-17 ENCOUNTER — Ambulatory Visit (INDEPENDENT_AMBULATORY_CARE_PROVIDER_SITE_OTHER): Payer: Medicaid Other | Admitting: Family Medicine

## 2019-11-17 ENCOUNTER — Other Ambulatory Visit: Payer: Self-pay

## 2019-11-17 VITALS — BP 110/70 | HR 77 | Wt 213.0 lb

## 2019-11-17 DIAGNOSIS — R0789 Other chest pain: Secondary | ICD-10-CM

## 2019-11-17 DIAGNOSIS — J069 Acute upper respiratory infection, unspecified: Secondary | ICD-10-CM

## 2019-11-17 DIAGNOSIS — Z3491 Encounter for supervision of normal pregnancy, unspecified, first trimester: Secondary | ICD-10-CM | POA: Diagnosis not present

## 2019-11-17 NOTE — Assessment & Plan Note (Signed)
Resolved. NO further work up at this time.

## 2019-11-17 NOTE — Assessment & Plan Note (Signed)
Improving. Mild congestion.

## 2019-11-17 NOTE — Progress Notes (Signed)
   Subjective:   Patient ID: Kristen Dean    DOB: 2000/02/08, 19 y.o. female   MRN: [id number]  Kristen Dean is a 19 y.o. female with a history of low risk pregnancy at [redacted]w[redacted]d here for follow up of URI.  URI + Chest pain: Notes her symptoms are improved. Some congestion. Cough improving. Chest pain resolved. Denies fevers, chills, SOB. Notes mild headache occasionally.  Review of Systems:  Per HPI.   Objective:   BP 110/70   Pulse 77   Wt 213 lb (96.6 kg)   LMP 06/06/2019 (Exact Date)   BMI 32.39 kg/m  Vitals and nursing note reviewed.  General: pleasant young woman, sitting comfortably in exam chair, well nourished, well developed, in no acute distress with non-toxic appearance Resp: breathing comfortably on room air, speaking in full sentences Extremities:  normal tone MSK: gait normal Neuro: Alert and oriented, speech normal FHR: 160 Fundal Height: 23 cm  Assessment & Plan:   Viral URI with cough Improving. Mild congestion.   Chest pain, unspecified Resolved. NO further work up at this time.   Patient will need pap-smear and 1-hr GTT at follow up visit on 11/26/19. Normal early 1h GTT.  Orders Placed This Encounter  Procedures  . Glucose Tolerance, 1 Hour    Standing Status:   Future    Standing Expiration Date:   11/16/2020   No orders of the defined types were placed in this encounter.   Orpah Cobb, DO PGY-3, [facility] 11/17/2019 2:11 PM

## 2019-11-24 ENCOUNTER — Ambulatory Visit (INDEPENDENT_AMBULATORY_CARE_PROVIDER_SITE_OTHER): Payer: Medicaid Other | Admitting: Family Medicine

## 2019-11-24 ENCOUNTER — Other Ambulatory Visit (HOSPITAL_COMMUNITY)
Admission: RE | Admit: 2019-11-24 | Discharge: 2019-11-24 | Disposition: A | Discharge status: Home / Self Care | Payer: Medicaid Other | Source: Ambulatory Visit | Attending: Family Medicine | Admitting: Family Medicine

## 2019-11-24 ENCOUNTER — Other Ambulatory Visit: Payer: Self-pay

## 2019-11-24 VITALS — BP 100/60 | HR 76 | Wt 208.4 lb

## 2019-11-24 DIAGNOSIS — Z124 Encounter for screening for malignant neoplasm of cervix: Secondary | ICD-10-CM | POA: Insufficient documentation

## 2019-11-24 DIAGNOSIS — Z3491 Encounter for supervision of normal pregnancy, unspecified, first trimester: Secondary | ICD-10-CM

## 2019-11-24 DIAGNOSIS — Z3492 Encounter for supervision of normal pregnancy, unspecified, second trimester: Secondary | ICD-10-CM

## 2019-11-24 DIAGNOSIS — Z23 Encounter for immunization: Secondary | ICD-10-CM

## 2019-11-24 NOTE — Progress Notes (Signed)
   [facility] Prenatal Visit  Kristen Dean is a 19 y.o. [phone] at [redacted]w[redacted]d here for routine follow up. She is dated by early ultrasound.  She reports no bleeding, no contractions, no cramping and no leaking.  She reports "flutters" as fetal movement. No bleeding, loss of fluid, contractions. See flow sheet for details. Plans on Poral contraception pills for contraception. She is planning to formula feed and declined breast feeding.   Vitals:   11/24/19 1403  BP: 100/60  Pulse: 76     A/P: Pregnancy at [redacted]w[redacted]d.  Doing well.   . Dating reviewed, dating tab is correct . Fetal heart tones Appropriate . Fundal height within expected range.  . Anatomy ultrasound reviewed and which was normal however due to fetal position was difficult to view certain anatomy.  (available for review under media).  Pt has scheduled OB anatomy US for 12/04/19. Per pt, everything was fine and she is having a boy.  . Influenza vaccine administered today. .  . COVID vaccination was discussed and declined   . Indications for screening for preexisting diabetes include: BMI > 25 and high risk ethnicity (Latino, [place] American, Native American, [place] Islander, Asian Naval architect) .  Marland Kitchen Pregnancy education provided on the following topics: fetal growth and movement, ultrasound assessment, and upcoming laboratory assessment.   . Scheduled for [facility] during second trimester on 12/11/19. . Preterm labor precautions given.   2. Pregnancy issues include the following and were addressed as appropriate today:  . PAP performed today.   . 1 hour GTT performed today        Pt declined COVID testing. Continue to recommend Covid vaccine.  . Problem list and pregnancy box updated: Yes.     Follow up 12/11/19 in [facility] faculty clinic.    Katha Cabal, DO PGY-2, [facility] 11/24/2019 4:23 PM

## 2019-11-24 NOTE — Patient Instructions (Addendum)
It was great seeing you today!  Please check-out at the front desk before leaving the clinic. I'd like to see you back on 12/11/19 but if you need to be seen earlier than that for any new issues we're happy to fit you in, just give us a call!  Visit Remembers: - Continue taking prenatal vitamins! Place them in the cabinent to help you remember.   Regarding lab work today:  Due to recent changes in healthcare laws, you may see the results of your imaging and laboratory studies on MyChart before your provider has had a chance to review them.  I understand that in some cases there may be results that are confusing or concerning to you. Not all laboratory results come back in the same time frame and you may be waiting for multiple results in order to interpret others.  Please give us 72 hours in order for your provider to thoroughly review all the results before contacting the office for clarification of your results. If everything is normal, you will get a letter in the mail or a message in My Chart. Please give us a call if you do not hear from us after 2 weeks.  Please bring all of your medications with you to each visit.    If you haven't already, sign up for My Chart to have easy access to your labs results, and communication with your primary care physician.  Feel free to call with any questions or concerns at any time, at [phone].   Take care,  Dr. [facility]  Second Trimester of Pregnancy The second trimester is from week 14 through week 27 (months 4 through 6). The second trimester is often a time when you feel your best. Your body has adjusted to being pregnant, and you begin to feel better physically. Usually, morning sickness has lessened or quit completely, you may have more energy, and you may have an increase in appetite. The second trimester is also a time when the fetus is growing rapidly. At the end of the sixth month, the fetus is about 9 inches  long and weighs about 1 pounds. You will likely begin to feel the baby move (quickening) between 16 and 20 weeks of pregnancy. Body changes during your second trimester Your body continues to go through many changes during your second trimester. The changes vary from woman to woman.  Your weight will continue to increase. You will notice your lower abdomen bulging out.  You may begin to get stretch marks on your hips, abdomen, and breasts.  You may develop headaches that can be relieved by medicines. The medicines should be approved by your health care provider.  You may urinate more often because the fetus is pressing on your bladder.  You may develop or continue to have heartburn as a result of your pregnancy.  You may develop constipation because certain hormones are causing the muscles that push waste through your intestines to slow down.  You may develop hemorrhoids or swollen, bulging veins (varicose veins).  You may have back pain. This is caused by: ? Weight gain. ? Pregnancy hormones that are relaxing the joints in your pelvis. ? A shift in weight and the muscles that support your balance.  Your breasts will continue to grow and they will continue to become tender.  Your gums may bleed and may be sensitive to brushing and flossing.  Dark spots or blotches (chloasma, mask of pregnancy) may develop on your face.  This will likely fade after the baby is born.  A dark line from your belly button to the pubic area (linea nigra) may appear. This will likely fade after the baby is born.  You may have changes in your hair. These can include thickening of your hair, rapid growth, and changes in texture. Some women also have hair loss during or after pregnancy, or hair that feels dry or thin. Your hair will most likely return to normal after your baby is born. What to expect at prenatal visits During a routine prenatal visit:  You will be weighed to make sure you and the fetus are  growing normally.  Your blood pressure will be taken.  Your abdomen will be measured to track your baby's growth.  The fetal heartbeat will be listened to.  Any test results from the previous visit will be discussed. Your health care provider may ask you:  How you are feeling.  If you are feeling the baby move.  If you have had any abnormal symptoms, such as leaking fluid, bleeding, severe headaches, or abdominal cramping.  If you are using any tobacco products, including cigarettes, chewing tobacco, and electronic cigarettes.  If you have any questions. Other tests that may be performed during your second trimester include:  Blood tests that check for: ? Low iron levels (anemia). ? High blood sugar that affects pregnant women (gestational diabetes) between 25 and 28 weeks. ? Rh antibodies. This is to check for a protein on red blood cells (Rh factor).  Urine tests to check for infections, diabetes, or protein in the urine.  An ultrasound to confirm the proper growth and development of the baby.  An amniocentesis to check for possible genetic problems.  Fetal screens for spina bifida and Down syndrome.  HIV (human immunodeficiency virus) testing. Routine prenatal testing includes screening for HIV, unless you choose not to have this test. Follow these instructions at home: Medicines  Follow your health care provider's instructions regarding medicine use. Specific medicines may be either safe or unsafe to take during pregnancy.  Take a prenatal vitamin that contains at least 600 micrograms (mcg) of folic acid.  If you develop constipation, try taking a stool softener if your health care provider approves. Eating and drinking   Eat a balanced diet that includes fresh fruits and vegetables, whole grains, good sources of protein such as meat, eggs, or tofu, and low-fat dairy. Your health care provider will help you determine the amount of weight gain that is right for  you.  Avoid raw meat and uncooked cheese. These carry germs that can cause birth defects in the baby.  If you have low calcium intake from food, talk to your health care provider about whether you should take a daily calcium supplement.  Limit foods that are high in fat and processed sugars, such as fried and sweet foods.  To prevent constipation: ? Drink enough fluid to keep your urine clear or pale yellow. ? Eat foods that are high in fiber, such as fresh fruits and vegetables, whole grains, and beans. Activity  Exercise only as directed by your health care provider. Most women can continue their usual exercise routine during pregnancy. Try to exercise for 30 minutes at least 5 days a week. Stop exercising if you experience uterine contractions.  Avoid heavy lifting, wear low heel shoes, and practice good posture.  A sexual relationship may be continued unless your health care provider directs you otherwise. Relieving pain and discomfort  Wear  a good support bra to prevent discomfort from breast tenderness.  Take warm sitz baths to soothe any pain or discomfort caused by hemorrhoids. Use hemorrhoid cream if your health care provider approves.  Rest with your legs elevated if you have leg cramps or low back pain.  If you develop varicose veins, wear support hose. Elevate your feet for 15 minutes, 3-4 times a day. Limit salt in your diet. Prenatal Care  Write down your questions. Take them to your prenatal visits.  Keep all your prenatal visits as told by your health care provider. This is important. Safety  Wear your seat belt at all times when driving.  Make a list of emergency phone numbers, including numbers for family, friends, the hospital, and police and fire departments. General instructions  Ask your health care provider for a referral to a local prenatal education class. Begin classes no later than the beginning of month 6 of your pregnancy.  Ask for help if you  have counseling or nutritional needs during pregnancy. Your health care provider can offer advice or refer you to specialists for help with various needs.  Do not use hot tubs, steam rooms, or saunas.  Do not douche or use tampons or scented sanitary pads.  Do not cross your legs for long periods of time.  Avoid cat litter boxes and soil used by cats. These carry germs that can cause birth defects in the baby and possibly loss of the fetus by miscarriage or stillbirth.  Avoid all smoking, herbs, alcohol, and unprescribed drugs. Chemicals in these products can affect the formation and growth of the baby.  Do not use any products that contain nicotine or tobacco, such as cigarettes and e-cigarettes. If you need help quitting, ask your health care provider.  Visit your dentist if you have not gone yet during your pregnancy. Use a soft toothbrush to brush your teeth and be gentle when you floss. Contact a health care provider if:  You have dizziness.  You have mild pelvic cramps, pelvic pressure, or nagging pain in the abdominal area.  You have persistent nausea, vomiting, or diarrhea.  You have a bad smelling vaginal discharge.  You have pain when you urinate. Get help right away if:  You have a fever.  You are leaking fluid from your vagina.  You have spotting or bleeding from your vagina.  You have severe abdominal cramping or pain.  You have rapid weight gain or weight loss.  You have shortness of breath with chest pain.  You notice sudden or extreme swelling of your face, hands, ankles, feet, or legs.  You have not felt your baby move in over an hour.  You have severe headaches that do not go away when you take medicine.  You have vision changes. Summary  The second trimester is from week 14 through week 27 (months 4 through 6). It is also a time when the fetus is growing rapidly.  Your body goes through many changes during pregnancy. The changes vary from woman to  woman.  Avoid all smoking, herbs, alcohol, and unprescribed drugs. These chemicals affect the formation and growth your baby.  Do not use any tobacco products, such as cigarettes, chewing tobacco, and e-cigarettes. If you need help quitting, ask your health care provider.  Contact your health care provider if you have any questions. Keep all prenatal visits as told by your health care provider. This is important. This information is not intended to replace advice given to you  by your health care provider. Make sure you discuss any questions you have with your health care provider. Document Revised: 04/12/2018 Document Reviewed: 01/25/2016 Elsevier Patient Education  2020 [employer].

## 2019-11-25 LAB — GLUCOSE TOLERANCE, 1 HOUR: Glucose, 1Hr PP: 96 mg/dL (ref 65–199)

## 2019-11-26 ENCOUNTER — Encounter: Payer: Medicaid Other | Admitting: Family Medicine

## 2019-11-26 LAB — CYTOLOGY - PAP: Diagnosis: NEGATIVE

## 2019-11-29 ENCOUNTER — Inpatient Hospital Stay (HOSPITAL_COMMUNITY)
Admission: AD | Admit: 2019-11-29 | Discharge: 2019-11-29 | Disposition: A | Discharge status: Home / Self Care | Payer: Medicaid Other | Attending: Obstetrics & Gynecology | Admitting: Obstetrics & Gynecology

## 2019-11-29 ENCOUNTER — Encounter (HOSPITAL_COMMUNITY): Payer: Self-pay | Admitting: Obstetrics & Gynecology

## 2019-11-29 ENCOUNTER — Other Ambulatory Visit: Payer: Self-pay

## 2019-11-29 DIAGNOSIS — O99282 Endocrine, nutritional and metabolic diseases complicating pregnancy, second trimester: Secondary | ICD-10-CM | POA: Diagnosis not present

## 2019-11-29 DIAGNOSIS — O99891 Other specified diseases and conditions complicating pregnancy: Secondary | ICD-10-CM | POA: Insufficient documentation

## 2019-11-29 DIAGNOSIS — Z3A24 24 weeks gestation of pregnancy: Secondary | ICD-10-CM | POA: Insufficient documentation

## 2019-11-29 DIAGNOSIS — O26892 Other specified pregnancy related conditions, second trimester: Secondary | ICD-10-CM | POA: Diagnosis not present

## 2019-11-29 DIAGNOSIS — Z79899 Other long term (current) drug therapy: Secondary | ICD-10-CM | POA: Insufficient documentation

## 2019-11-29 DIAGNOSIS — O368120 Decreased fetal movements, second trimester, not applicable or unspecified: Secondary | ICD-10-CM | POA: Insufficient documentation

## 2019-11-29 NOTE — Discharge Instructions (Signed)
Fetal Movement Counts Patient Name: ________________________________________________ Patient Due Date: ____________________ What is a fetal movement count?  A fetal movement count is the number of times that you feel your baby move during a certain amount of time. This may also be called a fetal kick count. A fetal movement count is recommended for every pregnant woman. You may be asked to start counting fetal movements as early as week 28 of your pregnancy. You are currently 24 weeks, most movements are not felt Pay attention to when your baby is most active. You may notice your baby's sleep and wake cycles. You may also notice things that make your baby move more. You should do a fetal movement count:  When your baby is normally most active.  At the same time each day. A good time to count movements is while you are resting, after having something to eat and drink. How do I count fetal movements? 1. Find a quiet, comfortable area. Sit, or lie down on your side. 2. Write down the date, the start time and stop time, and the number of movements that you felt between those two times. Take this information with you to your health care visits. 3. Write down your start time when you feel the first movement. 4. Count kicks, flutters, swishes, rolls, and jabs. You should feel at least 10 movements. 5. You may stop counting after you have felt 10 movements, or if you have been counting for 2 hours. Write down the stop time. 6. If you do not feel 10 movements in 2 hours, contact your health care provider for further instructions. Your health care provider may want to do additional tests to assess your baby's well-being. Contact a health care provider if:  You feel fewer than 10 movements in 2 hours.  Your baby is not moving like he or she usually does. Date: ____________ Start time: ____________ Stop time: ____________ Movements: ____________ Date: ____________ Start time: ____________ Stop time:  ____________ Movements: ____________ Date: ____________ Start time: ____________ Stop time: ____________ Movements: ____________ Date: ____________ Start time: ____________ Stop time: ____________ Movements: ____________ Date: ____________ Start time: ____________ Stop time: ____________ Movements: ____________ Date: ____________ Start time: ____________ Stop time: ____________ Movements: ____________ Date: ____________ Start time: ____________ Stop time: ____________ Movements: ____________ Date: ____________ Start time: ____________ Stop time: ____________ Movements: ____________ Date: ____________ Start time: ____________ Stop time: ____________ Movements: ____________ This information is not intended to replace advice given to you by your health care provider. Make sure you discuss any questions you have with your health care provider. Document Revised: 08/08/2018 Document Reviewed: 08/08/2018 Elsevier Patient Education  2020 [employer].

## 2019-11-29 NOTE — MAU Provider Note (Signed)
Obstetric Attending MAU Note  Chief Complaint:  Decreased Fetal Movement   First Provider Initiated Contact with Patient 11/29/19 1347     HPI: Kristen Dean is a 19 y.o. G4P1021 at [redacted]w[redacted]d who presents to maternity admissions reporting no fetal movements in 2-3 days. Usually feels flutters, no big movements.  No other concerns. Denies contractions, leakage of fluid or vaginal bleeding.   Pregnancy Course: Receives care at [facility] Patient Active Problem List   Diagnosis Date Noted  . Chest pain, unspecified 11/11/2019  . Obesity affecting pregnancy in first trimester 08/22/2019  . Supervision of low-risk pregnancy, second trimester 07/17/2019  . GERD (gastroesophageal reflux disease) 08/08/2017  . Vitamin D deficiency 02/21/2015  . Learning disability 01/27/2013    Past Medical History:  Diagnosis Date  . GERD (gastroesophageal reflux disease) 08/08/2017  . Medical history non-contributory     OB History  Gravida Para Term Preterm AB Living  4 1 1   2 1   SAB TAB Ectopic Multiple Live Births  2     0 1    # Outcome Date GA Lbr Len/2nd Weight Sex Delivery Anes PTL Lv  4 Current           3 SAB 03/08/19 [redacted]w[redacted]d   U SAB     2 Term 09/27/17 [redacted]w[redacted]d / 02:58 3569 g M Vag-Spont EPI  LIV  1 SAB 2018 [redacted]w[redacted]d           Obstetric Comments  Pregnancy #2 Baby Boy, Vit D Deficiency, Fetal Pyelectasis that resolved    Past Surgical History:  Procedure Laterality Date  . NO PAST SURGERIES      Family History: Family History  Problem Relation Age of Onset  . Diabetes Mother   . Hypertension Mother   . Diabetes Maternal Aunt   . Diabetes Maternal Grandmother   . Diabetes Sister   . Mental retardation Sister   . Diabetes Maternal Uncle   . Diabetes Maternal Grandfather     Social History: Social History   Tobacco Use  . Smoking status: Never Smoker  . Smokeless tobacco: Never Used  Vaping Use  . Vaping Use: Never used  Substance Use Topics  . Alcohol use: No  . Drug use: No     Allergies: No Known Allergies  Medications Prior to Admission  Medication Sig Dispense Refill Last Dose  . ondansetron (ZOFRAN ODT) 4 MG disintegrating tablet Take 1 tablet (4 mg total) by mouth every 6 (six) hours as needed for nausea. 30 tablet 2 Past Month at Unknown time  . Prenatal Vit-Fe Fumarate-FA (PREPLUS) 27-1 MG TABS Take 1 tablet by mouth daily. 30 tablet 13 Past Week at Unknown time  . Doxylamine-Pyridoxine (DICLEGIS) 10-10 MG TBEC Take 2 qhs; may also take one in am and one in afternoon prn nausea 120 tablet 6 More than a month at Unknown time    ROS: Pertinent findings in history of present illness.  Physical Exam  Blood pressure 107/63, pulse 77, temperature 98.3 F (36.8 C), temperature source Oral, resp. rate 18, height 5\' 8"  (1.727 m), weight 95.4 kg, last menstrual period 06/06/2019, SpO2 99 %, unknown if currently breastfeeding. CONSTITUTIONAL: Well-developed, well-nourished female in no acute distress.  HENT:  Normocephalic, atraumatic, External right and left ear normal.  EYES: Conjunctivae and EOM are normal. Pupils are equal, round, and reactive to light. No scleral icterus.  NECK: Normal range of motion, supple, no masses SKIN: Skin is warm and dry. No rash noted. Not diaphoretic. No  erythema. No pallor. NEUROLGIC: Alert and oriented to person, place, and time. Normal reflexes, muscle tone coordination. No cranial nerve deficit noted. PSYCHIATRIC: Normal mood and affect. Normal behavior. Normal judgment and thought content. CARDIOVASCULAR: Normal heart rate noted, regular rhythm RESPIRATORY: Effort and breath sounds normal, no problems with respiration noted ABDOMEN: Soft, nontender, nondistended, gravid appropriate for gestational age PELVIC: Deferred MUSCULOSKELETAL: Normal range of motion. No edema and no tenderness. 2+ distal pulses.  FHT:  Baseline 140 bpm , moderate variability, no accelerations and no decelerations Contractions: none   MAU  Course: Bedside scan done to show patient 6-7 fetal movements. She was reassured Reassuring FHR tracing  Assessment: 1. Decreased fetal movements, second trimester   2. [redacted] weeks gestation of pregnancy     Plan: Patient reassured about fetal status, reassured that most fetal movements at this gestational age are not felt. Discharged to home with appropriate fetal movement precautions for this gestational age Follow up with OB provider as scheduled    Allergies as of 11/29/2019   No Known Allergies     Medication List    TAKE these medications   Doxylamine-Pyridoxine 10-10 MG Tbec Commonly known as: Diclegis Take 2 qhs; may also take one in am and one in afternoon prn nausea   ondansetron 4 MG disintegrating tablet Commonly known as: Zofran ODT Take 1 tablet (4 mg total) by mouth every 6 (six) hours as needed for nausea.   PrePLUS 27-1 MG Tabs Take 1 tablet by mouth daily.       Tereso Newcomer, MD 11/29/2019 2:09 PM

## 2019-11-29 NOTE — MAU Note (Signed)
Hasn't felt any movements in 2-3 days.  Usually feels little flutters, has never really felt big movement.  Denies pain, bleeding or LOF.

## 2019-12-04 ENCOUNTER — Ambulatory Visit: Payer: Medicaid Other | Admitting: *Deleted

## 2019-12-04 ENCOUNTER — Ambulatory Visit: Payer: Medicaid Other | Attending: Obstetrics and Gynecology

## 2019-12-04 ENCOUNTER — Encounter: Payer: Self-pay | Admitting: *Deleted

## 2019-12-04 ENCOUNTER — Other Ambulatory Visit: Payer: Self-pay

## 2019-12-04 VITALS — BP 107/48 | HR 80

## 2019-12-04 DIAGNOSIS — E669 Obesity, unspecified: Secondary | ICD-10-CM

## 2019-12-04 DIAGNOSIS — Z362 Encounter for other antenatal screening follow-up: Secondary | ICD-10-CM | POA: Diagnosis not present

## 2019-12-04 DIAGNOSIS — O99212 Obesity complicating pregnancy, second trimester: Secondary | ICD-10-CM | POA: Diagnosis not present

## 2019-12-04 DIAGNOSIS — Z3A25 25 weeks gestation of pregnancy: Secondary | ICD-10-CM | POA: Diagnosis not present

## 2019-12-04 NOTE — Progress Notes (Signed)
Pt states" I have no idea what my pre-pregnancy weight was."

## 2019-12-11 ENCOUNTER — Ambulatory Visit (INDEPENDENT_AMBULATORY_CARE_PROVIDER_SITE_OTHER): Payer: Medicaid Other | Admitting: Family Medicine

## 2019-12-11 ENCOUNTER — Other Ambulatory Visit: Payer: Self-pay

## 2019-12-11 VITALS — BP 92/62 | HR 82 | Wt 213.8 lb

## 2019-12-11 DIAGNOSIS — Z3493 Encounter for supervision of normal pregnancy, unspecified, third trimester: Secondary | ICD-10-CM

## 2019-12-11 NOTE — Patient Instructions (Signed)
It was nice seeing you today!.  Your next appoint is in 2 weeks with Dr. Neita Garnet at 12/23 at 1:30 PM. You will get the Tdap vaccine and blood work at that visit.  Stay well, Littie Deeds, MD

## 2019-12-11 NOTE — Progress Notes (Signed)
  [facility] Prenatal Visit  Kristen Dean is a 19 y.o. [phone] at [redacted]w[redacted]d here for routine follow up. She is dated by early ultrasound.  She reports no complaints. She reports fetal movement. Denies vaginal bleeding, loss of fluid, or contractions.  See flow sheet for details.  A/P: Pregnancy at [redacted]w[redacted]d.  Doing well.   . Dating reviewed, dating tab is correct . Fetal heart tones Appropriate . Fundal height within expected range.  . Influenza vaccine previously administered.   Marland Kitchen COVID vaccination was discussed and patient declined.  . Screening for gestational diabetes completed on 11/22, passed . Pregnancy education completed including: fetal growth, breastfeeding, contraception, and expected weight gain in pregnancy.   . The patient does not have a history of Cesarean delivery and no referral to [facility] is indicated . Preterm labor, bleeding, and pain precautions given.    2. Pregnancy issues include the following and were addressed as appropriate today:  . Problem list and pregnancy box updated: Yes.   Flu vaccine and COVID vaccine discussed and stressed importance again, patient declines at this time.   Follow up 2 weeks - will need Tdap vaccine at that time and 28 wk labs including CBC, RPR, HIV.  Consider checking Vit D level as has not been checked this pregnancy.  I was available as preceptor in clinic, and saw this patient with Dr Wynelle Link. I agree with the assessment and plan as documented below.  Burley Saver, MD

## 2019-12-24 ENCOUNTER — Other Ambulatory Visit: Payer: Self-pay

## 2019-12-24 ENCOUNTER — Ambulatory Visit (INDEPENDENT_AMBULATORY_CARE_PROVIDER_SITE_OTHER): Payer: Medicaid Other | Admitting: Family Medicine

## 2019-12-24 ENCOUNTER — Encounter: Payer: Self-pay | Admitting: Family Medicine

## 2019-12-24 VITALS — BP 100/56 | HR 95

## 2019-12-24 DIAGNOSIS — Z3493 Encounter for supervision of normal pregnancy, unspecified, third trimester: Secondary | ICD-10-CM

## 2019-12-24 DIAGNOSIS — E559 Vitamin D deficiency, unspecified: Secondary | ICD-10-CM | POA: Diagnosis not present

## 2019-12-24 NOTE — Progress Notes (Signed)
    SUBJECTIVE:   CHIEF COMPLAINT / HPI: concern for vaginal discharge  Patient reports noticing vaginal discharge that appeared like mucous while wiping this AM at 9:30. She states that she wanted to be evaluated as she remembers having discharge that was like mucous before having her oldest child. She denies contractions but does reports some mild lower abdominal pain that lasted for ~61minutes and was associated with her standing. She reports that this pain was "managable". She denies vaginal bleeding or any leaking of fluids. She reports fetal movement.    PERTINENT  PMH / PSH:  [redacted]w[redacted]d GA  Two SABs   OBJECTIVE:   BP (!) 100/56   Pulse 95   LMP 06/06/2019 (Exact Date)   General: female appearing stated age in no acute distress Cardio: Normal S1 and S2, no S3 or S4. Rhythm is regular.  Pulm: Clear to auscultation bilaterally, no crackles, wheezing Abdomen: Bowel sounds normal. Abdomen soft and non-tender.  Extremities: No peripheral edema. Warm/ well perfused.   FHT 139  Fundal height 28cm   ASSESSMENT/PLAN:   Supervision of low-risk pregnancy, third trimester Patient will need Tdap at next appt Collected CBC, RPR , HIV and Vitamin D level today  Completed letter for maternity leave at the expected date of delivery  Also completed work note as patient was out today in order to be evaluated in clinic   Pain likely related to round ligament pain  Vaginal discharge most likely, as patient is not having regular contractions/pain and no bleeding, she was not recommended for labor evaluation in MAU but did discuss at length labor precautions as well as any signs of bleeding or increased pain, patient voiced understanding        Ronnald Ramp, MD [facility]

## 2019-12-24 NOTE — Patient Instructions (Signed)
We will complete blood work today and notify you of any abnormal results.  Please be aware of any vaginal bleeding or continuous leakage of fluid says this may be indicative of preterm labor and will require you to go to the MAU for further evaluation.  If you begin to have cramping that comes and goes and lasts for a minute before each session, I recommend going to the MAU to be observed on the monitor save we do not want to miss preterm labor.   Third Trimester of Pregnancy  The third trimester is from week 28 through week 40 (months 7 through 9). This trimester is when your unborn baby (fetus) is growing very fast. At the end of the ninth month, the unborn baby is about 20 inches in length. It weighs about 6-10 pounds. Follow these instructions at home: Medicines  Take over-the-counter and prescription medicines only as told by your doctor. Some medicines are safe and some medicines are not safe during pregnancy.  Take a prenatal vitamin that contains at least 600 micrograms (mcg) of folic acid.  If you have trouble pooping (constipation), take medicine that will make your stool soft (stool softener) if your doctor approves. Eating and drinking   Eat regular, healthy meals.  Avoid raw meat and uncooked cheese.  If you get low calcium from the food you eat, talk to your doctor about taking a daily calcium supplement.  Eat four or five small meals rather than three large meals a day.  Avoid foods that are high in fat and sugars, such as fried and sweet foods.  To prevent constipation: ? Eat foods that are high in fiber, like fresh fruits and vegetables, whole grains, and beans. ? Drink enough fluids to keep your pee (urine) clear or pale yellow. Activity  Exercise only as told by your doctor. Stop exercising if you start to have cramps.  Avoid heavy lifting, wear low heels, and sit up straight.  Do not exercise if it is too hot, too humid, or if you are in a place of  great height (high altitude).  You may continue to have sex unless your doctor tells you not to. Relieving pain and discomfort  Wear a good support bra if your breasts are tender.  Take frequent breaks and rest with your legs raised if you have leg cramps or low back pain.  Take warm water baths (sitz baths) to soothe pain or discomfort caused by hemorrhoids. Use hemorrhoid cream if your doctor approves.  If you develop puffy, bulging veins (varicose veins) in your legs: ? Wear support hose or compression stockings as told by your doctor. ? Raise (elevate) your feet for 15 minutes, 3-4 times a day. ? Limit salt in your food. Safety  Wear your seat belt when driving.  Make a list of emergency phone numbers, including numbers for family, friends, the hospital, and police and fire departments. Preparing for your baby's arrival To prepare for the arrival of your baby:  Take prenatal classes.  Practice driving to the hospital.  Visit the hospital and tour the maternity area.  Talk to your work about taking leave once the baby comes.  Pack your hospital bag.  Prepare the baby's room.  Go to your doctor visits.  Buy a rear-facing car seat. Learn how to install it in your car. General instructions  Do not use hot tubs, steam rooms, or saunas.  Do not use any products that contain nicotine or tobacco, such as cigarettes  and e-cigarettes. If you need help quitting, ask your doctor.  Do not drink alcohol.  Do not douche or use tampons or scented sanitary pads.  Do not cross your legs for long periods of time.  Do not travel for long distances unless you must. Only do so if your doctor says it is okay.  Visit your dentist if you have not gone during your pregnancy. Use a soft toothbrush to brush your teeth. Be gentle when you floss.  Avoid cat litter boxes and soil used by cats. These carry germs that can cause birth defects in the baby and can cause a loss of your baby  (miscarriage) or stillbirth.  Keep all your prenatal visits as told by your doctor. This is important. Contact a doctor if:  You are not sure if you are in labor or if your water has broken.  You are dizzy.  You have mild cramps or pressure in your lower belly.  You have a nagging pain in your belly area.  You continue to feel sick to your stomach, you throw up, or you have watery poop.  You have bad smelling fluid coming from your vagina.  You have pain when you pee. Get help right away if:  You have a fever.  You are leaking fluid from your vagina.  You are spotting or bleeding from your vagina.  You have severe belly cramps or pain.  You lose or gain weight quickly.  You have trouble catching your breath and have chest pain.  You notice sudden or extreme puffiness (swelling) of your face, hands, ankles, feet, or legs.  You have not felt the baby move in over an hour.  You have severe headaches that do not go away with medicine.  You have trouble seeing.  You are leaking, or you are having a gush of fluid, from your vagina before you are 37 weeks.  You have regular belly spasms (contractions) before you are 37 weeks. Summary  The third trimester is from week 28 through week 40 (months 7 through 9). This time is when your unborn baby is growing very fast.  Follow your doctor's advice about medicine, food, and activity.  Get ready for the arrival of your baby by taking prenatal classes, getting all the baby items ready, preparing the baby's room, and visiting your doctor to be checked.  Get help right away if you are bleeding from your vagina, or you have chest pain and trouble catching your breath, or if you have not felt your baby move in over an hour. This information is not intended to replace advice given to you by your health care provider. Make sure you discuss any questions you have with your health care provider. Document Revised: 04/11/2018 Document  Reviewed: 01/25/2016 Elsevier Patient Education  2020 [employer].

## 2019-12-25 ENCOUNTER — Other Ambulatory Visit: Payer: Self-pay | Admitting: Family Medicine

## 2019-12-25 ENCOUNTER — Encounter: Payer: Medicaid Other | Admitting: Family Medicine

## 2019-12-25 DIAGNOSIS — E559 Vitamin D deficiency, unspecified: Secondary | ICD-10-CM

## 2019-12-25 LAB — CBC WITH DIFFERENTIAL/PLATELET
Basophils Absolute: 0 10*3/uL (ref 0.0–0.2)
Basos: 0 %
EOS (ABSOLUTE): 0.1 10*3/uL (ref 0.0–0.4)
Eos: 1 %
Hematocrit: 33.8 % — ABNORMAL LOW (ref 34.0–46.6)
Hemoglobin: 11.5 g/dL (ref 11.1–15.9)
Immature Grans (Abs): 0 10*3/uL (ref 0.0–0.1)
Immature Granulocytes: 0 %
Lymphocytes Absolute: 2 10*3/uL (ref 0.7–3.1)
Lymphs: 21 %
MCH: 30.7 pg (ref 26.6–33.0)
MCHC: 34 g/dL (ref 31.5–35.7)
MCV: 90 fL (ref 79–97)
Monocytes Absolute: 0.7 10*3/uL (ref 0.1–0.9)
Monocytes: 7 %
Neutrophils Absolute: 7.1 10*3/uL — ABNORMAL HIGH (ref 1.4–7.0)
Neutrophils: 71 %
Platelets: 263 10*3/uL (ref 150–450)
RBC: 3.75 x10E6/uL — ABNORMAL LOW (ref 3.77–5.28)
RDW: 13.1 % (ref 11.7–15.4)
WBC: 9.9 10*3/uL (ref 3.4–10.8)

## 2019-12-25 LAB — RPR: RPR Ser Ql: NONREACTIVE

## 2019-12-25 LAB — HIV ANTIBODY (ROUTINE TESTING W REFLEX): HIV Screen 4th Generation wRfx: NONREACTIVE

## 2019-12-25 LAB — VITAMIN D 25 HYDROXY (VIT D DEFICIENCY, FRACTURES): Vit D, 25-Hydroxy: 9.2 ng/mL — ABNORMAL LOW (ref 30.0–100.0)

## 2019-12-25 MED ORDER — ERGOCALCIFEROL 10 MCG (400 UNIT) PO TABS: 800.0000 [IU] | ORAL_TABLET | Freq: Every morning | ORAL | 1 refills | Status: DC

## 2019-12-25 NOTE — Progress Notes (Signed)
Prescribed 800 units of vitamin D daily.

## 2019-12-25 NOTE — Assessment & Plan Note (Signed)
Patient will need Tdap at next appt Collected CBC, RPR , HIV and Vitamin D level today  Completed letter for maternity leave at the expected date of delivery  Also completed work note as patient was out today in order to be evaluated in clinic   Pain likely related to round ligament pain  Vaginal discharge most likely, as patient is not having regular contractions/pain and no bleeding, she was not recommended for labor evaluation in MAU but did discuss at length labor precautions as well as any signs of bleeding or increased pain, patient voiced understanding

## 2019-12-25 NOTE — Assessment & Plan Note (Signed)
Prescribed 800 IU of vitamin D daily .

## 2020-01-03 NOTE — L&D Delivery Note (Addendum)
OB/[facility]N Faculty Practice Delivery Note  Kristen Dean is a 20 y.o. [id number] s/p SVD at [redacted]w[redacted]d. She was admitted for SOL.   ROM: 6h 24m with mild meconium fluid GBS Status:  Negative/-- (03/02 1415) Maximum Maternal Temperature:  Temp (24hrs), Avg:98.4 F (36.9 C), Min:98.4 F (36.9 C), Max:98.5 F (36.9 C)    Labor Progress: Patient arrived at 6 cm dilation and labor progressed spontaneously. Patient did have episode of non reassuring fetal heart rate and required terb and fluid bolus and concerning fetal monitoring resolved.  Delivery Date/Time: 03/14/2020 at 1625 Delivery: Called to room and patient was complete and pushing. Head delivered in direct OA position. No nuchal cord present. Shoulder and body delivered in usual fashion. Infant with spontaneous cry, placed on mother's abdomen, dried and stimulated. Cord clamped x 2 after 1-minute delay, and cut by mothers cousin. Cord blood drawn. Placenta delivered spontaneously with gentle cord traction. Fundus firm with massage and Pitocin. Labia, perineum, vagina, and cervix inspected with perineal tear extending into the sulcus requiring repair..   Placenta: Complete, spontaneous, three-vessel cord Complications: None Lacerations: Perineal tear requiring repair with 3-0 Vicryl sutures EBL: 100 mL Analgesia: Epidural  Infant: APGAR (1 MIN): 8   APGAR (5 MINS): 9   APGAR (10 MINS):    Weight: Pending  Derrel Nip, MD  PGY-2, [facility]  03/14/2020 5:08 PM   I attest that I was gowned and gloved for the delivery and repair and I agree with the above documentation and findings.   Luna Kitchens

## 2020-01-08 ENCOUNTER — Ambulatory Visit (INDEPENDENT_AMBULATORY_CARE_PROVIDER_SITE_OTHER): Payer: Medicaid Other | Admitting: Family Medicine

## 2020-01-08 ENCOUNTER — Other Ambulatory Visit: Payer: Self-pay

## 2020-01-08 VITALS — BP 108/62 | HR 93 | Wt 219.0 lb

## 2020-01-08 DIAGNOSIS — E559 Vitamin D deficiency, unspecified: Secondary | ICD-10-CM

## 2020-01-08 DIAGNOSIS — Z3493 Encounter for supervision of normal pregnancy, unspecified, third trimester: Secondary | ICD-10-CM

## 2020-01-08 DIAGNOSIS — Z23 Encounter for immunization: Secondary | ICD-10-CM | POA: Diagnosis not present

## 2020-01-08 MED ORDER — ERGOCALCIFEROL 10 MCG (400 UNIT) PO TABS: 800.0000 [IU] | ORAL_TABLET | Freq: Every morning | ORAL | 1 refills | Status: DC

## 2020-01-08 NOTE — Patient Instructions (Addendum)
It was a pleasure to see you today!  1.Go to the MAU at [facility] if:  You have pain in your lower abdomen or pelvic area  Your water breaks.  Sometimes it is a big gush of fluid, sometimes it is just a trickle that keeps getting your underwear wet or running down your legs  You have vaginal bleeding.  2. Follow up in 2 weeks for your next prenatal visit.  3. Your vitamin D supplements are at the pharmacy   Be Well,  Dr. Leary Roca

## 2020-01-08 NOTE — Progress Notes (Signed)
  [facility] Prenatal Visit  Kristen Dean is a 20 y.o. [phone] at [redacted]w[redacted]d here for routine follow up. She is dated by early ultrasound.  She reports heartburn. She reports fetal movement. She denies vaginal bleeding, contractions, or loss of fluid. See flow sheet for details.  Vitals:   01/08/20 1531  BP: 108/62  Pulse: 93    A/P: Pregnancy at [redacted]w[redacted]d.  Doing well.   1. Routine prenatal care:  Marland Kitchen Dating reviewed, dating tab is correct . Fetal heart tones Appropriate . Fundal height within expected range.  . Infant feeding choice: Formula feeding  . Contraception choice: Pills  . Infant circumcision desired no  . The patient does not have a history of Cesarean delivery and no referral to [facility] is indicated . Influenza vaccine previously administered.   . Tdap was given today. . 1 hour glucola, CBC, RPR, and HIV were not obtained today. Patient had a normal 1 hr GTT on 11/24/2019 at 24 weeks.    . Rh status was reviewed and patient does not need Rhogam.  Rhogam was not given today.  . Pregnancy medical home and PHQ-9 forms were done today and reviewed.   . Childbirth and education classes were offered. . Pregnancy education regarding benefits of breastfeeding, contraception, fetal growth, expected weight gain, and safe infant sleep were discussed.  . Preterm labor and fetal movement precautions reviewed.  2. Pregnancy issues include the following and were addressed as appropriate today:  . Chest Pain: patient described very mild pressure sub-sternally for 3 days with sore throat and head ache, worse with laying down. Patient is unvaccinated for COVID, no fever. Since PE and vitals are very reassuring, no need for further testing at this time, gave patient return precautions for fever, dehydration, respiratory distress, new/different chest pain.  . Problem list and pregnancy box updated: Yes.   Follow up 2 weeks.

## 2020-01-14 ENCOUNTER — Encounter (HOSPITAL_COMMUNITY): Payer: Self-pay | Admitting: Family Medicine

## 2020-01-14 ENCOUNTER — Other Ambulatory Visit: Payer: Self-pay

## 2020-01-14 ENCOUNTER — Inpatient Hospital Stay (HOSPITAL_COMMUNITY)
Admission: AD | Admit: 2020-01-14 | Discharge: 2020-01-14 | Disposition: A | Discharge status: Home / Self Care | Payer: Medicaid Other | Attending: Family Medicine | Admitting: Family Medicine

## 2020-01-14 DIAGNOSIS — O99891 Other specified diseases and conditions complicating pregnancy: Secondary | ICD-10-CM

## 2020-01-14 DIAGNOSIS — R0789 Other chest pain: Secondary | ICD-10-CM

## 2020-01-14 DIAGNOSIS — O99613 Diseases of the digestive system complicating pregnancy, third trimester: Secondary | ICD-10-CM | POA: Insufficient documentation

## 2020-01-14 DIAGNOSIS — Z3A31 31 weeks gestation of pregnancy: Secondary | ICD-10-CM | POA: Diagnosis not present

## 2020-01-14 DIAGNOSIS — R519 Headache, unspecified: Secondary | ICD-10-CM | POA: Insufficient documentation

## 2020-01-14 DIAGNOSIS — G44209 Tension-type headache, unspecified, not intractable: Secondary | ICD-10-CM

## 2020-01-14 DIAGNOSIS — K219 Gastro-esophageal reflux disease without esophagitis: Secondary | ICD-10-CM | POA: Insufficient documentation

## 2020-01-14 DIAGNOSIS — O26893 Other specified pregnancy related conditions, third trimester: Secondary | ICD-10-CM | POA: Insufficient documentation

## 2020-01-14 LAB — URINALYSIS, ROUTINE W REFLEX MICROSCOPIC
Bilirubin Urine: NEGATIVE
Glucose, UA: NEGATIVE mg/dL
Hgb urine dipstick: NEGATIVE
Ketones, ur: NEGATIVE mg/dL
Nitrite: NEGATIVE
Protein, ur: 30 mg/dL — AB
Specific Gravity, Urine: 1.028 (ref 1.005–1.030)
pH: 7 (ref 5.0–8.0)

## 2020-01-14 MED ORDER — ACETAMINOPHEN 500 MG PO TABS: 1000.0000 mg | ORAL_TABLET | Freq: Once | ORAL | Status: DC

## 2020-01-14 MED ORDER — DICYCLOMINE HCL 10 MG/5ML PO SOLN
10.0000 mg | Freq: Once | ORAL | Status: DC
  Filled 2020-01-14: qty 5

## 2020-01-14 MED ORDER — CYCLOBENZAPRINE HCL 5 MG PO TABS: 10.0000 mg | ORAL_TABLET | Freq: Once | ORAL | Status: DC

## 2020-01-14 MED ORDER — LIDOCAINE VISCOUS HCL 2 % MT SOLN
15.0000 mL | Freq: Once | OROMUCOSAL | Status: DC
  Filled 2020-01-14: qty 15

## 2020-01-14 MED ORDER — ALUM & MAG HYDROXIDE-SIMETH 200-200-20 MG/5ML PO SUSP
30.0000 mL | Freq: Once | ORAL | Status: DC
  Filled 2020-01-14: qty 30

## 2020-01-14 MED ORDER — OMEPRAZOLE 20 MG PO CPDR: 20.0000 mg | DELAYED_RELEASE_CAPSULE | Freq: Every day | ORAL | 2 refills | Status: DC

## 2020-01-14 NOTE — Discharge Instructions (Signed)
Food Choices for Gastroesophageal Reflux Disease, Adult When you have gastroesophageal reflux disease (GERD), the foods you eat and your eating habits are very important. Choosing the right foods can help ease your discomfort. Think about working with a food expert (dietitian) to help you make good choices. What are tips for following this plan? Reading food labels  Look for foods that are low in saturated fat. Foods that may help with your symptoms include: ? Foods that have less than 5% of daily value (DV) of fat. ? Foods that have 0 grams of trans fat. Cooking  Do not fry your food.  Cook your food by baking, steaming, grilling, or broiling. These are all methods that do not need a lot of fat for cooking.  To add flavor, try to use herbs that are low in spice and acidity. Meal planning  Choose healthy foods that are low in fat, such as: ? Fruits and vegetables. ? Whole grains. ? Low-fat dairy products. ? Lean meats, fish, and poultry.  Eat small meals often instead of eating 3 large meals each day. Eat your meals slowly in a place where you are relaxed. Avoid bending over or lying down until 2-3 hours after eating.  Limit high-fat foods such as fatty meats or fried foods.  Limit your intake of fatty foods, such as oils, butter, and shortening.  Avoid the following as told by your doctor: ? Foods that cause symptoms. These may be different for different people. Keep a food diary to keep track of foods that cause symptoms. ? Alcohol. ? Drinking a lot of liquid with meals. ? Eating meals during the 2-3 hours before bed.   Lifestyle  Stay at a healthy weight. Ask your doctor what weight is healthy for you. If you need to lose weight, work with your doctor to do so safely.  Exercise for at least 30 minutes on 5 or more days each week, or as told by your doctor.  Wear loose-fitting clothes.  Do not smoke or use any products that contain nicotine or tobacco. If you need help  quitting, ask your doctor.  Sleep with the head of your bed higher than your feet. Use a wedge under the mattress or blocks under the bed frame to raise the head of the bed.  Chew sugar-free gum after meals. What foods should eat? Eat a healthy, well-balanced diet of fruits, vegetables, whole grains, low-fat dairy products, lean meats, fish, and poultry. Each person is different. Foods that may cause symptoms in one person may not cause any symptoms in another person. Work with your doctor to find foods that are safe for you. The items listed above may not be a complete list of what you can eat and drink. Contact a food expert for more options.   What foods should I avoid? Limiting some of these foods may help in managing the symptoms of GERD. Everyone is different. Talk with a food expert or your doctor to help you find the exact foods to avoid, if any. Fruits Any fruits prepared with added fat. Any fruits that cause symptoms. For some people, this may include citrus fruits, such as oranges, grapefruit, pineapple, and lemons. Vegetables Deep-fried vegetables. French fries. Any vegetables prepared with added fat. Any vegetables that cause symptoms. For some people, this may include tomatoes and tomato products, chili peppers, onions and garlic, and horseradish. Grains Pastries or quick breads with added fat. Meats and other proteins High-fat meats, such as fatty beef or pork,   hot dogs, ribs, ham, sausage, salami, and bacon. Fried meat or protein, including fried fish and fried chicken. Nuts and nut butters, in large amounts. Dairy Whole milk and chocolate milk. Sour cream. Cream. Ice cream. Cream cheese. Milkshakes. Fats and oils Butter. Margarine. Shortening. Ghee. Beverages Coffee and tea, with or without caffeine. Carbonated beverages. Sodas. Energy drinks. Fruit juice made with acidic fruits, such as orange or grapefruit. Tomato juice. Alcoholic drinks. Sweets and desserts Chocolate and  cocoa. Donuts. Seasonings and condiments Pepper. Peppermint and spearmint. Added salt. Any condiments, herbs, or seasonings that cause symptoms. For some people, this may include curry, hot sauce, or vinegar-based salad dressings. The items listed above may not be a complete list of what you should not eat and drink. Contact a food expert for more options. Questions to ask your doctor Diet and lifestyle changes are often the first steps that are taken to manage symptoms of GERD. If diet and lifestyle changes do not help, talk with your doctor about taking medicines. Where to find more information  International Foundation for Gastrointestinal Disorders: aboutgerd.org Summary  When you have GERD, food and lifestyle choices are very important in easing your symptoms.  Eat small meals often instead of 3 large meals a day. Eat your meals slowly and in a place where you are relaxed.  Avoid bending over or lying down until 2-3 hours after eating.  Limit high-fat foods such as fatty meats or fried foods. This information is not intended to replace advice given to you by your health care provider. Make sure you discuss any questions you have with your health care provider. Document Revised: 06/30/2019 Document Reviewed: 06/30/2019 Elsevier Patient Education  2021 Elsevier Inc.  Tension Headache, Adult A tension headache is a feeling of pain, pressure, or aching in the head. It is often felt over the front and sides of the head. Tension headaches can last from 30 minutes to several days. What are the causes? The cause of this condition is not known. Sometimes, tension headaches are brought on by stress, worry (anxiety), or depression. Other things that may set them off include:  Alcohol.  Too much caffeine or caffeine withdrawal.  Colds, flu, or sinus infections.  Dental problems. This can include clenching your teeth.  Being tired.  Holding your head and neck in the same position for a  long time, such as while using a computer.  Smoking.  Arthritis in the neck. What are the signs or symptoms?  Feeling pressure around the head.  A dull ache in the head.  Pain over the front and sides of the head.  Feeling sore or tender in the muscles of the head, neck, and shoulders. How is this treated? This condition may be treated with lifestyle changes and with medicines that help relieve symptoms. Follow these instructions at home: Managing pain  Take over-the-counter and prescription medicines only as told by your doctor.  When you have a headache, lie down in a dark, quiet room.  If told, put ice on your head and neck. To do this: ? Put ice in a plastic bag. ? Place a towel between your skin and the bag. ? Leave the ice on for 20 minutes, 2-3 times a day. ? Take off the ice if your skin turns bright red. This is very important. If you cannot feel pain, heat, or cold, you have a greater risk of damage to the area.  If told, put heat on the back of your neck. Do  this as often as told by your doctor. Use the heat source that your doctor recommends, such as a moist heat pack or a heating pad. ? Place a towel between your skin and the heat source. ? Leave the heat on for 20-30 minutes. ? Take off the heat if your skin turns bright red. This is very important. If you cannot feel pain, heat, or cold, you have a greater risk of getting burned. Eating and drinking  Eat meals on a regular schedule.  If you drink alcohol: ? Limit how much you have to:  0-1 drink a day for women who are not pregnant.  0-2 drinks a day for men. ? Know how much alcohol is in your drink. In the [place] one drink equals one 12 oz bottle of beer (355 mL), one 5 oz glass of wine (148 mL), or one 1 oz glass of hard liquor (44 mL).  Drink enough fluid to keep your pee (urine) pale yellow.  Do not use a lot of caffeine, or stop using caffeine. Lifestyle  Get 7-9 hours of sleep each night. Or get  the amount of sleep that your doctor tells you to.  At bedtime, keep computers, phones, and tablets out of your room.  Find ways to lessen your stress. This may include: ? Exercise. ? Deep breathing. ? Yoga. ? Listening to music. ? Thinking positive thoughts.  Sit up straight. Try to relax your muscles.  Do not smoke or use any products that contain nicotine or tobacco. If you need help quitting, ask your doctor. General instructions  Avoid things that can bring on headaches. Keep a headache journal to see what may bring on headaches. For example, write down: ? What you eat and drink. ? How much sleep you get. ? Any change to your diet or medicines.  Keep all follow-up visits.   Contact a doctor if:  Your headache does not get better.  Your headache comes back.  You have a headache, and sounds, light, or smells bother you.  You feel like you may vomit, or you vomit.  Your stomach hurts. Get help right away if:  You all of a sudden get a very bad headache with any of these things: ? A stiff neck. ? Feeling like you may vomit. ? Vomiting. ? Feeling mixed up (confused). ? Feeling weak in one part or one side of your body. ? Having trouble seeing or speaking, or both. ? Feeling short of breath. ? A rash. ? Feeling very sleepy. ? Pain in your eye or ear. ? Trouble walking or balancing. ? Feeling like you will faint, or you faint. Summary  A tension headache is pain, pressure, or aching in your head.  Tension headaches can last from 30 minutes to several days.  Lifestyle changes and medicines may help relieve pain. This information is not intended to replace advice given to you by your health care provider. Make sure you discuss any questions you have with your health care provider. Document Revised: 09/18/2019 Document Reviewed: 09/18/2019 Elsevier Patient Education  2021 [employer].

## 2020-01-14 NOTE — MAU Provider Note (Signed)
Event Date/Time   First Provider Initiated Contact with Patient 01/14/20 1513     S Ms. Rochele Lueck is a 20 y.o. [phone] pregnant female at [redacted]w[redacted]d who presents to MAU today with complaint of right sided abdominal pain that feels positional due to the way the baby is laying. She denies contractions, bleeding or LOF. She also complains of a mild headache and light-headedness but has not taken anything because a pharmacist told her not to take Tylenol. Reports substernal pain that is not aggravated by movement or breathing but "feels like reflux." She is not taking anything for reflux. She denies exposure to Covid or any other symptoms.  O BP 105/63 (BP Location: Right Arm)   Pulse 84   Temp 98.1 F (36.7 C) (Oral)   Resp 20   Ht 5\' 8"  (1.727 m)   Wt 221 lb 3.2 oz (100.3 kg)   LMP 06/06/2019 (Exact Date)   SpO2 99%   BMI 33.63 kg/m  Physical Exam Vitals and nursing note reviewed. Exam conducted with a chaperone present.  Constitutional:      General: She is not in acute distress.    Appearance: She is well-developed and normal weight. She is not ill-appearing.  HENT:     Mouth/Throat:     Mouth: Mucous membranes are moist.  Eyes:     Pupils: Pupils are equal, round, and reactive to light.  Cardiovascular:     Rate and Rhythm: Normal rate and regular rhythm.     Heart sounds: Normal heart sounds.  Pulmonary:     Effort: Pulmonary effort is normal. No respiratory distress.     Breath sounds: Normal breath sounds. No wheezing.  Chest:     Chest wall: No tenderness.  Abdominal:     General: Bowel sounds are normal.     Tenderness: There is no abdominal tenderness. There is no guarding.     Comments: Gravid  Baby positioned mostly on right side of pt  Musculoskeletal:        General: Normal range of motion.     Cervical back: Normal range of motion.  Skin:    General: Skin is warm and dry.     Capillary Refill: Capillary refill takes less than 2 seconds.  Neurological:      Mental Status: She is alert and oriented to person, place, and time.  Psychiatric:        Mood and Affect: Mood is anxious.        Behavior: Behavior normal.        Thought Content: Thought content normal.        Judgment: Judgment normal.    Fetal Tracing: reactive Baseline: 135 Variability: moderate Accelerations: presentnone Decelerations: Toco: relaxed  Offered Tylenol, flexeril and GI cocktail for symptoms. When RN went in to give meds, pt declined and stated she preferred to be discharged and try meds at home. VSS so discharged in stable condition  A Headache GERD [redacted] weeks gestation Fetal heart tones present  P Discharge from MAU in stable condition with prescription for omeprazole Pt to follow up as scheduled with Family Medicine Warning signs for worsening condition that would warrant emergency follow-up discussed  08/06/2019, CNM 01/14/2020 4:18 PM

## 2020-01-14 NOTE — MAU Note (Signed)
Pt states she began having right-sided abdominal cramping that started this morning. Denies cntrx, VB or LOF. She also endorses a headache and lightheadedness that began on Monday.  . Did not take Tylenol - says a "pharmacist" told her not to take it. Also has had substernal pain/pressure for several weeks and that her doctor is aware of this. Marland Kitchen +FM

## 2020-01-15 ENCOUNTER — Telehealth: Payer: Self-pay

## 2020-01-15 NOTE — Telephone Encounter (Signed)
Patient calls nurse line on 01/14/20 at approx. 1030 regarding headache, dizziness and lightheadedness. Patient reports onset on Monday 1/10. Patient is also complaining of substernal "chest pressure" that started last Friday. Denies SHOB or radiating pain. Patient also reports that pain is worse when she is lying down and that the pain comes in waves. Denies contractions, back pain, loss of fluids, vaginal bleeding and reports good fetal movement.   Spoke with preceptor Germaine Pomfret) and advised that patient should take 1000 mg tylenol every 6 hours as needed, increase PO fluids and monitor for signs of increased BP. Advised that patient should be evaluated in MAU with worsening of symptoms or if BP is greater than 140/90. Patient verbalizes understanding.     Veronda Prude, RN

## 2020-01-17 NOTE — Progress Notes (Signed)
Kristen Dean is a 20 y.o. [phone] at [redacted]w[redacted]d here for routine follow up. She is dated by early ultrasound.  She reports no complaints. See flow sheet for details.  Today's Vitals   01/22/20 1435  BP: (!) 102/54  Pulse: 94  Weight: 218 lb 6.4 oz (99.1 kg)   Body mass index is 33.21 kg/m. A/P: Pregnancy at [redacted]w[redacted]d.  Doing well.   . Dating reviewed, dating tab is correct . Fetal heart tones: Appropriate . Fundal height: within expected range.  . Pregnancy issues include None. . Problem list updated: Yes . The patient does not have a history of HSV and valacyclovir is not at this time.  . The patient does not have a history of Cesarean delivery and no referral to [facility] is indicated  She is not taking prenatal vitamins. Encouraged to restart today. She enquired about Vit D and can take Vit D 4000iu daily.  . Infant feeding choice: Formula feeding  . Contraception choice: Pills  . Infant circumcision desired no  . Influenza vaccine previously administered.   . Tdap was not given today.  . Pregnancy education regarding benefits of breastfeeding, contraception, fetal growth, expected weight gain, and safe infant sleep were discussed.  . Preterm labor and fetal movement precautions reviewed. . Scheduled for [facility] clinic in third trimester on Feb 17 at 1130.   Follow up 2 weeks with Dr Neita Garnet.  Has an appointment scheduled with me on Feb 7 Will complete GC/C,GBS at this time

## 2020-01-17 NOTE — Patient Instructions (Signed)
Thank you for coming to see me today. It was a pleasure.  For any pregnancy-related emergencies after 5:00pm, please go to the Maternity Admissions Unit in the [facility] at [facility]. You will use hospital Entrance C.     Please follow-up with PCP in 2 weeks  If you have any questions or concerns, please do not hesitate to call the office at [phone].  Best,   Dana Allan, MD   [email or web address]  Third Trimester of Pregnancy  The third trimester of pregnancy is from week 28 through week 40. This is months 7 through 9. The third trimester is a time when the unborn baby (fetus) is growing rapidly. At the end of the ninth month, the fetus is about 20 inches long and weighs 6-10 pounds. Body changes during your third trimester During the third trimester, your body will continue to go through many changes. The changes vary and generally return to normal after your baby is born. Physical changes  Your weight will continue to increase. You can expect to gain 25-35 pounds (11-16 kg) by the end of the pregnancy if you begin pregnancy at a normal weight. If you are underweight, you can expect to gain 28-40 lb (about 13-18 kg), and if you are overweight, you can expect to gain 15-25 lb (about 7-11 kg).  You may begin to get stretch marks on your hips, abdomen, and breasts.  Your breasts will continue to grow and may hurt. A yellow fluid (colostrum) may leak from your breasts. This is the first milk you are producing for your baby.  You may have changes in your hair. These can include thickening of your hair, rapid growth, and changes in texture. Some people also have hair loss during or after pregnancy, or hair that feels dry or thin.  Your belly button may stick out.  You may notice more swelling in your hands, face, or ankles. Health changes  You may have heartburn.  You may have  constipation.  You may develop hemorrhoids.  You may develop swollen, bulging veins (varicose veins) in your legs.  You may have increased body aches in the pelvis, back, or thighs. This is due to weight gain and increased hormones that are relaxing your joints.  You may have increased tingling or numbness in your hands, arms, and legs. The skin on your abdomen may also feel numb.  You may feel short of breath because of your expanding uterus. Other changes  You may urinate more often because the fetus is moving lower into your pelvis and pressing on your bladder.  You may have more problems sleeping. This may be caused by the size of your abdomen, an increased need to urinate, and an increase in your body's metabolism.  You may notice the fetus "dropping," or moving lower in your abdomen (lightening).  You may have increased vaginal discharge.  You may notice that you have pain around your pelvic bone as your uterus distends. Follow these instructions at home: Medicines  Follow your health care provider's instructions regarding medicine use. Specific medicines may be either safe or unsafe to take during pregnancy. Do not take any medicines unless approved by your health care provider.  Take a prenatal vitamin that contains at least 600 micrograms (mcg) of folic acid. Eating and drinking  Eat a healthy diet that includes fresh fruits and vegetables, whole grains, good sources of protein such as meat, eggs, or tofu, and low-fat dairy products.  Avoid raw  meat and unpasteurized juice, milk, and cheese. These carry germs that can harm you and your baby.  Eat 4 or 5 small meals rather than 3 large meals a day.  You may need to take these actions to prevent or treat constipation: ? Drink enough fluid to keep your urine pale yellow. ? Eat foods that are high in fiber, such as beans, whole grains, and fresh fruits and vegetables. ? Limit foods that are high in fat and processed  sugars, such as fried or sweet foods. Activity  Exercise only as directed by your health care provider. Most people can continue their usual exercise routine during pregnancy. Try to exercise for 30 minutes at least 5 days a week. Stop exercising if you experience contractions in the uterus.  Stop exercising if you develop pain or cramping in the lower abdomen or lower back.  Avoid heavy lifting.  Do not exercise if it is very hot or humid or if you are at a high altitude.  If you choose to, you may continue to have sex unless your health care provider tells you not to. Relieving pain and discomfort  Take frequent breaks and rest with your legs raised (elevated) if you have leg cramps or low back pain.  Take warm sitz baths to soothe any pain or discomfort caused by hemorrhoids. Use hemorrhoid cream if your health care provider approves.  Wear a supportive bra to prevent discomfort from breast tenderness.  If you develop varicose veins: ? Wear support hose as told by your health care provider. ? Elevate your feet for 15 minutes, 3-4 times a day. ? Limit salt in your diet. Safety  Talk to your health care provider before traveling far distances.  Do not use hot tubs, steam rooms, or saunas.  Wear your seat belt at all times when driving or riding in a car.  Talk with your health care provider if someone is verbally or physically abusive to you. Preparing for birth To prepare for the arrival of your baby:  Take prenatal classes to understand, practice, and ask questions about labor and delivery.  Visit the hospital and tour the maternity area.  Purchase a rear-facing car seat and make sure you know how to install it in your car.  Prepare the baby's room or sleeping area. Make sure to remove all pillows and stuffed animals from the baby's crib to prevent suffocation. General instructions  Avoid cat litter boxes and soil used by cats. These carry germs that can cause birth  defects in the baby. If you have a cat, ask someone to clean the litter box for you.  Do not douche or use tampons. Do not use scented sanitary pads.  Do not use any products that contain nicotine or tobacco, such as cigarettes, e-cigarettes, and chewing tobacco. If you need help quitting, ask your health care provider.  Do not use any herbal remedies, illegal drugs, or medicines that were not prescribed to you. Chemicals in these products can harm your baby.  Do not drink alcohol.  You will have more frequent prenatal exams during the third trimester. During a routine prenatal visit, your health care provider will do a physical exam, perform tests, and discuss your overall health. Keep all follow-up visits. This is important. Where to find more information  American Pregnancy Association: americanpregnancy.org  [employer]necologists: https://www.todd-brady.net/  Office on [employer] Health: [place] Contact a health care provider if you have:  A fever.  Mild pelvic cramps,  pelvic pressure, or nagging pain in your abdominal area or lower back.  Vomiting or diarrhea.  Bad-smelling vaginal discharge or foul-smelling urine.  Pain when you urinate.  A headache that does not go away when you take medicine.  Visual changes or see spots in front of your eyes. Get help right away if:  Your water breaks.  You have regular contractions less than 5 minutes apart.  You have spotting or bleeding from your vagina.  You have severe abdominal pain.  You have difficulty breathing.  You have chest pain.  You have fainting spells.  You have not felt your baby move for the time period told by your health care provider.  You have new or increased pain, swelling, or redness in an arm or leg. Summary  The third trimester of pregnancy is from week 28 through week 40 (months 7 through 9).  You may have more problems sleeping. This  can be caused by the size of your abdomen, an increased need to urinate, and an increase in your body's metabolism.  You will have more frequent prenatal exams during the third trimester. Keep all follow-up visits. This is important. This information is not intended to replace advice given to you by your health care provider. Make sure you discuss any questions you have with your health care provider. Document Revised: 05/28/2019 Document Reviewed: 04/03/2019 Elsevier Patient Education  2021 [employer].

## 2020-01-22 ENCOUNTER — Other Ambulatory Visit: Payer: Self-pay

## 2020-01-22 ENCOUNTER — Ambulatory Visit (INDEPENDENT_AMBULATORY_CARE_PROVIDER_SITE_OTHER): Payer: Medicaid Other | Admitting: Family Medicine

## 2020-01-22 VITALS — BP 102/54 | HR 94 | Wt 218.4 lb

## 2020-01-22 DIAGNOSIS — Z3493 Encounter for supervision of normal pregnancy, unspecified, third trimester: Secondary | ICD-10-CM

## 2020-01-24 ENCOUNTER — Encounter: Payer: Self-pay | Admitting: Family Medicine

## 2020-02-02 ENCOUNTER — Other Ambulatory Visit: Payer: Self-pay

## 2020-02-02 ENCOUNTER — Inpatient Hospital Stay (HOSPITAL_BASED_OUTPATIENT_CLINIC_OR_DEPARTMENT_OTHER): Payer: Medicaid Other

## 2020-02-02 ENCOUNTER — Inpatient Hospital Stay (HOSPITAL_COMMUNITY)
Admission: AD | Admit: 2020-02-02 | Discharge: 2020-02-02 | Disposition: A | Discharge status: Home / Self Care | Payer: Medicaid Other | Attending: Family Medicine | Admitting: Family Medicine

## 2020-02-02 ENCOUNTER — Encounter (HOSPITAL_COMMUNITY): Payer: Self-pay | Admitting: Family Medicine

## 2020-02-02 DIAGNOSIS — O4703 False labor before 37 completed weeks of gestation, third trimester: Secondary | ICD-10-CM | POA: Diagnosis not present

## 2020-02-02 DIAGNOSIS — Z3A34 34 weeks gestation of pregnancy: Secondary | ICD-10-CM

## 2020-02-02 DIAGNOSIS — O26893 Other specified pregnancy related conditions, third trimester: Secondary | ICD-10-CM

## 2020-02-02 DIAGNOSIS — O26899 Other specified pregnancy related conditions, unspecified trimester: Secondary | ICD-10-CM

## 2020-02-02 DIAGNOSIS — Z3689 Encounter for other specified antenatal screening: Secondary | ICD-10-CM

## 2020-02-02 DIAGNOSIS — Z79899 Other long term (current) drug therapy: Secondary | ICD-10-CM | POA: Insufficient documentation

## 2020-02-02 DIAGNOSIS — R109 Unspecified abdominal pain: Secondary | ICD-10-CM | POA: Insufficient documentation

## 2020-02-02 LAB — URINALYSIS, ROUTINE W REFLEX MICROSCOPIC
Bilirubin Urine: NEGATIVE
Glucose, UA: NEGATIVE mg/dL
Hgb urine dipstick: NEGATIVE
Ketones, ur: NEGATIVE mg/dL
Leukocytes,Ua: NEGATIVE
Nitrite: NEGATIVE
Protein, ur: NEGATIVE mg/dL
Specific Gravity, Urine: 1.011 (ref 1.005–1.030)
pH: 7 (ref 5.0–8.0)

## 2020-02-02 LAB — WET PREP, GENITAL
Clue Cells Wet Prep HPF POC: NONE SEEN
Sperm: NONE SEEN
Trich, Wet Prep: NONE SEEN
Yeast Wet Prep HPF POC: NONE SEEN

## 2020-02-02 MED ORDER — ACETAMINOPHEN 500 MG PO TABS
1000.0000 mg | ORAL_TABLET | Freq: Four times a day (QID) | ORAL | Status: DC | PRN
  Administered 2020-02-02: 1000 mg via ORAL
  Filled 2020-02-02: qty 2

## 2020-02-02 MED ORDER — ACETAMINOPHEN 500 MG PO TABS: 1000.0000 mg | ORAL_TABLET | Freq: Four times a day (QID) | ORAL | 0 refills | Status: DC | PRN

## 2020-02-02 NOTE — Discharge Instructions (Signed)
Rosen's Emergency Medicine: Concepts and Clinical Practice (9th ed., pp. 2296- 2312). Elsevier.">  Braxton Hicks Contractions Contractions of the uterus can occur throughout pregnancy, but they are not always a sign that you are in labor. You may have practice contractions called Braxton Hicks contractions. These false labor contractions are sometimes confused with true labor. What are Braxton Hicks contractions? Braxton Hicks contractions are tightening movements that occur in the muscles of the uterus before labor. Unlike true labor contractions, these contractions do not result in opening (dilation) and thinning of the cervix. Toward the end of pregnancy (32-34 weeks), Braxton Hicks contractions can happen more often and may become stronger. These contractions are sometimes difficult to tell apart from true labor because they can be very uncomfortable. You should not feel embarrassed if you go to the hospital with false labor. Sometimes, the only way to tell if you are in true labor is for your health care provider to look for changes in the cervix. The health care provider will do a physical exam and may monitor your contractions. If you are not in true labor, the exam should show that your cervix is not dilating and your water has not broken. If there are no other health problems associated with your pregnancy, it is completely safe for you to be sent home with false labor. You may continue to have Braxton Hicks contractions until you go into true labor. How to tell the difference between true labor and false labor True labor  Contractions last 30-70 seconds.  Contractions become very regular.  Discomfort is usually felt in the top of the uterus, and it spreads to the lower abdomen and low back.  Contractions do not go away with walking.  Contractions usually become more intense and increase in frequency.  The cervix dilates and gets thinner. False labor  Contractions are usually shorter  and not as strong as true labor contractions.  Contractions are usually irregular.  Contractions are often felt in the front of the lower abdomen and in the groin.  Contractions may go away when you walk around or change positions while lying down.  Contractions get weaker and are shorter-lasting as time goes on.  The cervix usually does not dilate or become thin. Follow these instructions at home:  Take over-the-counter and prescription medicines only as told by your health care provider.  Keep up with your usual exercises and follow other instructions from your health care provider.  Eat and drink lightly if you think you are going into labor.  If Braxton Hicks contractions are making you uncomfortable: ? Change your position from lying down or resting to walking, or change from walking to resting. ? Sit and rest in a tub of warm water. ? Drink enough fluid to keep your urine pale yellow. Dehydration may cause these contractions. ? Do slow and deep breathing several times an hour.  Keep all follow-up prenatal visits as told by your health care provider. This is important.   Contact a health care provider if:  You have a fever.  You have continuous pain in your abdomen. Get help right away if:  Your contractions become stronger, more regular, and closer together.  You have fluid leaking or gushing from your vagina.  You pass blood-tinged mucus (bloody show).  You have bleeding from your vagina.  You have low back pain that you never had before.  You feel your baby's head pushing down and causing pelvic pressure.  Your baby is not moving inside   you as much as it used to. Summary  Contractions that occur before labor are called Braxton Hicks contractions, false labor, or practice contractions.  Braxton Hicks contractions are usually shorter, weaker, farther apart, and less regular than true labor contractions. True labor contractions usually become progressively  stronger and regular, and they become more frequent.  Manage discomfort from [facility] contractions by changing position, resting in a warm bath, drinking plenty of water, or practicing deep breathing. This information is not intended to replace advice given to you by your health care provider. Make sure you discuss any questions you have with your health care provider. Document Revised: 12/01/2016 Document Reviewed: 05/04/2016 Elsevier Patient Education  2021 Elsevier Inc.   Preventing Injuries During Pregnancy Injuries (trauma) can happen during pregnancy. Minor falls and accidents usually do not harm you or your baby. But some injuries can harm you and your baby. Tell your doctor about any injury you suffer. How can injuries affect me and my baby? Your baby can be harmed if there is a hard, direct hit to your belly or pelvis. The pelvis is the part of the body just above your thighs. The injuries:  Can tear your womb (uterus).  Can make the placenta pull away from the wall of the womb. The placenta is the organ that supplies food and air to the unborn baby.  Can break the amniotic sac. The amniotic sac is the bag of water that holds the baby.  Can lower the blood supply to your baby.  Can make you go into early labor.  Can cause other problems in your body, such as cuts, bruises, or broken bones.  Can cause very bad harm to other parts of your body, such as your brain, spine, heart, lungs, or other organs. What can increase my risk for injuries? You are more likely to have injuries during pregnancy if:  Your floors are slippery or cluttered.  You are not wearing a seat belt in a motor vehicle, or you drive in a way that is not safe.  You play high-contact sports.  You do activities that have a high risk for injury. These include skiing and horseback riding.  You fall.  You have dizziness or fainting.  You get physically abused or attacked by your spouse or  partner. What can I do to avoid injuries? Safety  Remove rugs and loose objects from the floor.  Wear comfortable shoes that have a good grip. Do not wear shoes that have high heels.  Always wear your seat belt in the car. The lap belt should be below your belly. Always drive safely.  Do not ride on a motorcycle.   Activity  Check your surroundings to avoid falling, slipping, or tripping.  Do not take part in high-contact sports.  Do not do actions that increase the risk of falling. General instructions  Take over-the-counter and prescription medicines only as told by your doctor.  If you have low blood sugar or not enough fluid in your body, you may feel faint or dizzy. To avoid this: ? Drink enough fluid to keep your pee (urine) pale yellow. ? Eat small meals often throughout the day.  If you are a victim of domestic violence or any type of attack: ? Call your local emergency services (911 in the [place] ? Contact the [employer] Violence Hotline for help and support. Call 380-242-1507 to speak to someone at the hotline, or connect through chat on their website at www.thehotline.org Get help  right away if:  You fall on your belly, or you get a hard, direct hit to your belly.  You have an injury, and you do not feel the baby move or the baby is not moving as much as normal.  You are attacked by your partner at home.  You are attacked in any place.  You have been in a car accident.  You have an injury, and: ? You get pain that feels like labor (contractions). ? You bleed or leak fluid from the vagina.  You have an injury, and you get these symptoms after the injury: ? You get weak. ? You get faint. ? You cannot stop vomiting. Summary  Some injuries that happen during pregnancy can do harm to the mother or the baby.  Falls, car accidents, domestic violence, or other attacks can injure you or your baby. Get help right away if you get these injuries.  Take  steps to avoid injury. This includes removing rugs and loose objects from the floor. Always wear your seat belt in the car.  Check your surroundings to avoid falling, slipping, or tripping.  Do not take part in high-contact sports. This information is not intended to replace advice given to you by your health care provider. Make sure you discuss any questions you have with your health care provider. Document Revised: 01/29/2019 Document Reviewed: 01/29/2019 Elsevier Patient Education  2021 [employer].

## 2020-02-02 NOTE — Progress Notes (Signed)
  [facility] Prenatal Visit  Kristen Dean is a 20 y.o. [phone] at [redacted]w[redacted]d here for routine follow up. She is dated by early ultrasound.  She reports vomiting 1-2 times per day.  She reports fetal movement. She denies vaginal bleeding, contractions, or loss of fluid.  See flow sheet for details.  Vitals:   02/03/20 1507  BP: 112/60  Pulse: 80    General: female appearing stated age in no acute distress Cardio: Normal S1 and S2, no S3 or S4. Rhythm is regular. No murmurs or rubs.  Bilateral radial pulses palpable Pulm: Clear to auscultation bilaterally, no crackles, wheezing, or diminished breath sounds. Normal respiratory effort Abdomen: Bowel sounds normal. Abdomen soft and non-tender. Extremities: No peripheral edema.   A/P: Pregnancy at [redacted]w[redacted]d.  Doing well.   1. Routine prenatal care:  Marland Kitchen Dating reviewed, dating tab is correct . Fetal heart tones: Appropriate  140 . Fundal height: within expected range.  34 . The patient does not have a history of HSV and valacyclovir is not indicated at this time.  . The patient does not have a history of Cesarean delivery and no referral to [facility] is indicated . Infant feeding choice: Both  . Contraception choice: Pills , Progesterone only  . Infant circumcision desired no . Influenza vaccine previously administered.  11/2019 . Tdap was not given today., given 01/2020 . COVID vaccination was discussed and declines.  . Childbirth and education classes were offered. Patient declines  . Pregnancy education regarding benefits of breastfeeding, contraception, fetal growth, expected weight gain, and safe infant sleep were discussed.  . Preterm labor and fetal movement precautions reviewed.   2. Pregnancy issues include the following and were addressed as appropriate today: . Problem list and pregnancy box updated: Yes.  -Discussed post partum contraception: POPs  -discussed labor precautions  -declines covid  vaccination at this time   Scheduled for Ob [facility] clinic in third trimester on 02/19/2020.   Follow up 2 weeks.
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
## 2020-02-02 NOTE — MAU Note (Signed)
Started having sharp abdominal pain that was constant and is now intermittent.  Stated the pain started in her umbilical region then extended to her lower abdomen and is now in her upper abdomen.  Reports some BH contractions last night that have gotten worse today.  Endorses excessive FM.  Denies LOF/VB.  Denies complications w/ her pregnancy.

## 2020-02-02 NOTE — MAU Note (Signed)
Pt returned from ultrasound-CNM in to discuss results and plan of care-pt to be discharged to home-pt agreeable-requesting work note

## 2020-02-03 ENCOUNTER — Encounter: Payer: Self-pay | Admitting: Family Medicine

## 2020-02-03 ENCOUNTER — Other Ambulatory Visit: Payer: Self-pay

## 2020-02-03 ENCOUNTER — Ambulatory Visit (INDEPENDENT_AMBULATORY_CARE_PROVIDER_SITE_OTHER): Payer: Medicaid Other | Admitting: Family Medicine

## 2020-02-03 VITALS — BP 112/60 | HR 80 | Wt 226.2 lb

## 2020-02-03 DIAGNOSIS — Z3483 Encounter for supervision of other normal pregnancy, third trimester: Secondary | ICD-10-CM

## 2020-02-03 LAB — GC/CHLAMYDIA PROBE AMP (~~LOC~~) NOT AT ARMC
Chlamydia: NEGATIVE
Comment: NEGATIVE
Comment: NORMAL
Neisseria Gonorrhea: NEGATIVE

## 2020-02-03 NOTE — Patient Instructions (Signed)

## 2020-02-09 ENCOUNTER — Telehealth: Payer: Self-pay | Admitting: *Deleted

## 2020-02-09 ENCOUNTER — Encounter: Payer: Medicaid Other | Admitting: Family Medicine

## 2020-02-09 NOTE — Telephone Encounter (Signed)
-----   Message from Dana Allan, MD sent at 01/24/2020  3:43 PM EST ----- Please let patient know she has an appointment scheduled for Feb 17 at 1130 in [facility] clinic  Thanks Kenney Houseman

## 2020-02-11 NOTE — Telephone Encounter (Signed)
Called pt. LVM asking her to call our office. If Pt calls, please give her the information below about her appt on February 17th @ 11:30. Sunday Spillers, CMA

## 2020-02-14 ENCOUNTER — Inpatient Hospital Stay (HOSPITAL_COMMUNITY)
Admission: AD | Admit: 2020-02-14 | Discharge: 2020-02-14 | Disposition: A | Discharge status: Home / Self Care | Payer: Medicaid Other | Attending: Obstetrics and Gynecology | Admitting: Obstetrics and Gynecology

## 2020-02-14 ENCOUNTER — Other Ambulatory Visit: Payer: Self-pay

## 2020-02-14 ENCOUNTER — Encounter (HOSPITAL_COMMUNITY): Payer: Self-pay | Admitting: Obstetrics and Gynecology

## 2020-02-14 DIAGNOSIS — Z3A35 35 weeks gestation of pregnancy: Secondary | ICD-10-CM

## 2020-02-14 DIAGNOSIS — O479 False labor, unspecified: Secondary | ICD-10-CM | POA: Insufficient documentation

## 2020-02-14 DIAGNOSIS — O4703 False labor before 37 completed weeks of gestation, third trimester: Secondary | ICD-10-CM | POA: Diagnosis not present

## 2020-02-14 LAB — URINALYSIS, ROUTINE W REFLEX MICROSCOPIC
Bilirubin Urine: NEGATIVE
Glucose, UA: NEGATIVE mg/dL
Hgb urine dipstick: NEGATIVE
Ketones, ur: NEGATIVE mg/dL
Leukocytes,Ua: NEGATIVE
Nitrite: NEGATIVE
Protein, ur: NEGATIVE mg/dL
Specific Gravity, Urine: 1.016 (ref 1.005–1.030)
pH: 8 (ref 5.0–8.0)

## 2020-02-14 NOTE — Discharge Instructions (Signed)

## 2020-02-14 NOTE — MAU Provider Note (Signed)
Event Date/Time   First Provider Initiated Contact with Patient 02/14/20 1446       S: Ms. Kristen Dean is a 20 y.o. [phone] at [redacted]w[redacted]d  who presents to MAU today complaining contractions q 10-15 minutes. She is unsure for how long she has been feeling them. She denies vaginal bleeding. She denies LOF. She reports normal fetal movement.    O: BP 120/66 (BP Location: Left Arm)   Pulse 86   Temp 98.1 F (36.7 C)   Resp 16   LMP 06/06/2019 (Exact Date)   SpO2 98%  GENERAL: Well-developed, well-nourished female in no acute distress.  HEAD: Normocephalic, atraumatic.  CHEST: Normal effort of breathing, regular heart rate ABDOMEN: Soft, nontender, gravid  Cervical exam:  Dilation: 1.5 Cervical Position: Posterior Exam by:: c Bryson Gavia cnm   Fetal Monitoring: Baseline: 125 Variability: moderate Accelerations: 15x15 Decelerations: none Contractions: occasional uc's   A: SIUP at [redacted]w[redacted]d  False labor  P: 1. False labor   2. [redacted] weeks gestation of pregnancy    -Discharge home in stable condition -Labor precautions discussed -Patient advised to follow-up with OB as scheduled for prenatal care -Patient may return to MAU as needed or if her condition were to change or worsen   Rolm Bookbinder, [place] 02/14/2020 7:48 PM

## 2020-02-14 NOTE — MAU Note (Signed)
Patient has been having "minor" CTX since yesterday morning that have been "irregular."  Pt denies vaginal bleeding or LOF.  Reports good fetal movement.

## 2020-02-17 ENCOUNTER — Ambulatory Visit: Payer: Medicaid Other | Admitting: Family Medicine

## 2020-02-19 ENCOUNTER — Ambulatory Visit (INDEPENDENT_AMBULATORY_CARE_PROVIDER_SITE_OTHER): Payer: Medicaid Other | Admitting: Family Medicine

## 2020-02-19 ENCOUNTER — Other Ambulatory Visit (HOSPITAL_COMMUNITY)
Admission: RE | Admit: 2020-02-19 | Discharge: 2020-02-19 | Disposition: A | Discharge status: Home / Self Care | Payer: Medicaid Other | Source: Ambulatory Visit | Attending: Family Medicine | Admitting: Family Medicine

## 2020-02-19 ENCOUNTER — Other Ambulatory Visit: Payer: Self-pay

## 2020-02-19 VITALS — BP 102/70 | HR 83

## 2020-02-19 DIAGNOSIS — Z3483 Encounter for supervision of other normal pregnancy, third trimester: Secondary | ICD-10-CM | POA: Insufficient documentation

## 2020-02-19 NOTE — Progress Notes (Signed)
  [facility] Prenatal Visit  Kristen Dean is a 20 y.o. [phone] at [redacted]w[redacted]d here for routine follow up. She is dated by early ultrasound.  She reports contractions since 35 weeks.. Gets them randomly daily. Last 5-10 mins and last for around an hour.  She reports fetal movement. She denies vaginal bleeding, contractions, or loss of fluid. See flow sheet for details.  Vitals:   02/19/20 1205  BP: 102/70  Pulse: 83    A/P: Pregnancy at [redacted]w[redacted]d.  Doing well.   1. Routine prenatal care  . Dating reviewed, dating tab is correct . Fetal heart tones Appropriate 128 . Fundal height within expected range.  136.5cm . Fetal position confirmed Vertex using Ultrasound .  Performed by Dr. Pollie Meyer. Marland Kitchen GBS collected today. .  . Repeat GC/CT collected today.  . The patient does not have a history of HSV and valacyclovir is not indicated at this time.  . Infant feeding choice: Both  . Contraception choice: Pills  progesterone . Infant circumcision desired no . Influenza vaccine previously administered.   11/2019 . Tdap given on 01/2020. Marland Kitchen COVID vaccination was discussed and patient will think about it . Pregnancy education regarding preterm labor, fetal movement,  benefits of breastfeeding, contraception, fetal growth, expected weight gain, and safe infant sleep were discussed.    2. Pregnancy issues include the following and were addressed as appropriate today:  . GBS swabs obtained today but obtained via speculum. Given incorrect method-if positive does not need repeat swabs next week.  If negative then will need repeat swabs next week. . Problem list and pregnancy box updated: Yes.  Follow up 1 week.

## 2020-02-19 NOTE — Patient Instructions (Signed)
  If you have any questions or concerns, please do not hesitate to call the office at [phone].  Go to the MAU at [facility] if:  You begin to have strong, frequent contractions  Your water breaks.  Sometimes it is a big gush of fluid, sometimes it is just a trickle that keeps getting your underwear wet or running down your legs  You have vaginal bleeding.  It is normal to have a small amount of spotting if your cervix was checked.   You do not feel your baby moving like normal.  If you do not, get something to eat and drink and lay down and focus on feeling your baby move.   If your baby is still not moving like normal, you should go to MAU. Marland Kitchen Please follow-up with me in 1 week  Best wishes,   Dr Allena Katz

## 2020-02-20 LAB — CERVICOVAGINAL ANCILLARY ONLY
Chlamydia: NEGATIVE
Comment: NEGATIVE
Comment: NEGATIVE
Comment: NORMAL
Neisseria Gonorrhea: NEGATIVE
Trichomonas: NEGATIVE

## 2020-02-23 LAB — CULTURE, BETA STREP (GROUP B ONLY): Strep Gp B Culture: NEGATIVE

## 2020-02-27 ENCOUNTER — Telehealth: Payer: Self-pay | Admitting: Family Medicine

## 2020-02-27 NOTE — Telephone Encounter (Signed)
Called patient to review labs from last week's visit, since I see she did not schedule a prenatal visit this past week at all.  GC/chl negative. GBS also negative, but swab was collected incorrectly and needs to be repeated.  Scheduled appointment with Dr. Obie Dredge (only female doctor with an appointment available) on Wednesday at 1:30p.  Please repeat vaginal and rectal GBS swab at that visit, ensuring it is collected without any lubrication and without a speculum inserted.  Latrelle Dodrill, MD

## 2020-03-03 ENCOUNTER — Other Ambulatory Visit: Payer: Self-pay

## 2020-03-03 ENCOUNTER — Ambulatory Visit (INDEPENDENT_AMBULATORY_CARE_PROVIDER_SITE_OTHER): Payer: Medicaid Other | Admitting: Family Medicine

## 2020-03-03 VITALS — BP 110/50 | HR 84 | Wt 227.0 lb

## 2020-03-03 DIAGNOSIS — Z3493 Encounter for supervision of normal pregnancy, unspecified, third trimester: Secondary | ICD-10-CM

## 2020-03-03 NOTE — Patient Instructions (Signed)

## 2020-03-03 NOTE — Progress Notes (Signed)
  [facility] Prenatal Visit  Zaria Taha is a 20 y.o. [phone] at [redacted]w[redacted]d here for routine follow up. She is dated by early ultrasound.  She reports no complaints. She reports fetal movement. She denies vaginal bleeding, contractions, or loss of fluid. See flow sheet for details.  Vitals:   03/03/20 1351  BP: (!) 110/50  Pulse: 84     A/P: Pregnancy at [redacted]w[redacted]d.  Doing well.   1. Routine prenatal care:  Marland Kitchen Dating reviewed, dating tab is correct . Fetal heart tones Appropriate . Fundal height within expected range.  . Fetal position confirmed Vertex using Ultrasound  at visit on 2/17.  . Infant feeding choice: Both  . Contraception choice: Pills  . Infant circumcision desired no . Pain control in labor discussed and patient desires epidural.  . Influenza vaccine previously administered.   . Tdap previously administered between 27-36 weeks  . GBS and gc/chlamydia testing results were reviewed today.  GBS was repeated due to improper collection. . Pregnancy education regarding labor, fetal movement,  benefits of breastfeeding, contraception, and safe infant sleep were discussed.  . Labor and fetal movement precautions reviewed. . Will need scheduled for 41 weeks IOL and BPP at next visit.  2. Pregnancy issues include the following and were addressed as appropriate today:  . GBS was inadvertently collected incorrectly at last visit.  Was negative, and today.  (Corrected appropriately.  Was repeated today. . Problem list and pregnancy box updated: Yes.   Follow up 1 week.

## 2020-03-07 LAB — CULTURE, BETA STREP (GROUP B ONLY): Strep Gp B Culture: NEGATIVE

## 2020-03-12 ENCOUNTER — Other Ambulatory Visit: Payer: Self-pay | Admitting: Advanced Practice Midwife

## 2020-03-12 ENCOUNTER — Ambulatory Visit (INDEPENDENT_AMBULATORY_CARE_PROVIDER_SITE_OTHER): Payer: Medicaid Other | Admitting: Family Medicine

## 2020-03-12 ENCOUNTER — Encounter: Payer: Self-pay | Admitting: Family Medicine

## 2020-03-12 ENCOUNTER — Other Ambulatory Visit: Payer: Self-pay

## 2020-03-12 VITALS — BP 122/75 | HR 76 | Wt 227.8 lb

## 2020-03-12 DIAGNOSIS — Z3483 Encounter for supervision of other normal pregnancy, third trimester: Secondary | ICD-10-CM

## 2020-03-12 DIAGNOSIS — Z3493 Encounter for supervision of normal pregnancy, unspecified, third trimester: Secondary | ICD-10-CM | POA: Diagnosis not present

## 2020-03-12 DIAGNOSIS — Z5329 Procedure and treatment not carried out because of patient's decision for other reasons: Secondary | ICD-10-CM

## 2020-03-12 NOTE — Patient Instructions (Addendum)
BPP ultrasound will be Wed 03/17/20 at [facility] at 3:15 PM  We have scheduled your induction for Sunday 03/21/20 at 11:45 PM at the [facility]    Vaginal Delivery  Vaginal delivery means that you give birth by pushing your baby out of your birth canal (vagina). A team of health care providers will help you before, during, and after vaginal delivery. Birth experiences are unique for every woman and every pregnancy, and birth experiences vary depending on where you choose to give birth. What happens when I arrive at the birth center or hospital? Once you are in labor and have been admitted into the hospital or birth center, your health care provider may:  Review your pregnancy history and any concerns that you have.  Insert an IV into one of your veins. This may be used to give you fluids and medicines.  Check your blood pressure, pulse, temperature, and heart rate (vital signs).  Check whether your bag of water (amniotic sac) has broken (ruptured).  Talk with you about your birth plan and discuss pain control options. Monitoring Your health care provider may monitor your contractions (uterine monitoring) and your baby's heart rate (fetal monitoring). You may need to be monitored:  Often, but not continuously (intermittently).  All the time or for long periods at a time (continuously). Continuous monitoring may be needed if: ? You are taking certain medicines, such as medicine to relieve pain or make your contractions stronger. ? You have pregnancy or labor complications. Monitoring may be done by:  Placing a special stethoscope or a handheld monitoring device on your abdomen to check your baby's heartbeat and to check for contractions.  Placing monitors on your abdomen (external monitors) to record your baby's heartbeat and the frequency and length of contractions.  Placing monitors inside your uterus through your vagina (internal monitors) to record your baby's  heartbeat and the frequency, length, and strength of your contractions. Depending on the type of monitor, it may remain in your uterus or on your baby's head until birth.  Telemetry. This is a type of continuous monitoring that can be done with external or internal monitors. Instead of having to stay in bed, you are able to move around during telemetry. Physical exam Your health care provider may perform frequent physical exams. This may include:  Checking how and where your baby is positioned in your uterus.  Checking your cervix to determine: ? Whether it is thinning out (effacing). ? Whether it is opening up (dilating). What happens during labor and delivery? Normal labor and delivery is divided into the following three stages: Stage 1  This is the longest stage of labor.  This stage can last for hours or days.  Throughout this stage, you will feel contractions. Contractions generally feel mild, infrequent, and irregular at first. They get stronger, more frequent (about every 2-3 minutes), and more regular as you move through this stage.  This stage ends when your cervix is completely dilated to 4 inches (10 cm) and completely effaced. Stage 2  This stage starts once your cervix is completely effaced and dilated and lasts until the delivery of your baby.  This stage may last from 20 minutes to 2 hours.  This is the stage where you will feel an urge to push your baby out of your vagina.  You may feel stretching and burning pain, especially when the widest part of your baby's head passes through the vaginal opening (crowning).  Once your baby  is delivered, the umbilical cord will be clamped and cut. This usually occurs after waiting a period of 1-2 minutes after delivery.  Your baby will be placed on your bare chest (skin-to-skin contact) in an upright position and covered with a warm blanket. Watch your baby for feeding cues, like rooting or sucking, and help the baby to your  breast for his or her first feeding. Stage 3  This stage starts immediately after the birth of your baby and ends after you deliver the placenta.  This stage may take anywhere from 5 to 30 minutes.  After your baby has been delivered, you will feel contractions as your body expels the placenta and your uterus contracts to control bleeding.   What can I expect after labor and delivery?  After labor is over, you and your baby will be monitored closely until you are ready to go home to ensure that you are both healthy. Your health care team will teach you how to care for yourself and your baby.  You and your baby will stay in the same room (rooming in) during your hospital stay. This will encourage early bonding and successful breastfeeding.  You may continue to receive fluids and medicines through an IV.  Your uterus will be checked and massaged regularly (fundal massage).  You will have some soreness and pain in your abdomen, vagina, and the area of skin between your vaginal opening and your anus (perineum).  If an incision was made near your vagina (episiotomy) or if you had some vaginal tearing during delivery, cold compresses may be placed on your episiotomy or your tear. This helps to reduce pain and swelling.  You may be given a squirt bottle to use instead of wiping when you go to the bathroom. To use the squirt bottle, follow these steps: ? Before you urinate, fill the squirt bottle with warm water. Do not use hot water. ? After you urinate, while you are sitting on the toilet, use the squirt bottle to rinse the area around your urethra and vaginal opening. This rinses away any urine and blood. ? Fill the squirt bottle with clean water every time you use the bathroom.  It is normal to have vaginal bleeding after delivery. Wear a sanitary pad for vaginal bleeding and discharge. Summary  Vaginal delivery means that you will give birth by pushing your baby out of your birth canal  (vagina).  Your health care provider may monitor your contractions (uterine monitoring) and your baby's heart rate (fetal monitoring).  Your health care provider may perform a physical exam.  Normal labor and delivery is divided into three stages.  After labor is over, you and your baby will be monitored closely until you are ready to go home. This information is not intended to replace advice given to you by your health care provider. Make sure you discuss any questions you have with your health care provider. Document Revised: 01/23/2017 Document Reviewed: 01/23/2017 Elsevier Patient Education  2021 [employer].   Labor Induction Labor induction is when steps are taken to cause a pregnant woman to begin the labor process. Most women go into labor on their own between 37 weeks and 42 weeks of pregnancy. When this does not happen, or when there is a medical need for labor to begin, steps may be taken to induce, or bring on, labor. Labor induction causes a pregnant woman's uterus to contract. It also causes the cervix to soften (ripen), open (dilate), and thin out. Usually,  labor is not induced before 39 weeks of pregnancy unless there is a medical reason to do so. When is labor induction considered? Labor induction may be right for you if:  Your pregnancy lasts longer than 41 to 42 weeks.  Your placenta is separating from your uterus (placental abruption).  You have a rupture of membranes and your labor does not begin.  You have health problems, like diabetes or high blood pressure (preeclampsia) during your pregnancy.  Your baby has stopped growing or does not have enough amniotic fluid. Before labor induction begins, your health care provider will consider the following factors:  Your medical condition and the baby's condition.  How many weeks you have been pregnant.  How mature the baby's lungs are.  The condition of your cervix.  The position of the baby.  The size of  your birth canal. Tell a health care provider about:  Any allergies you have.  All medicines you are taking, including vitamins, herbs, eye drops, creams, and over-the-counter medicines.  Any problems you or your family members have had with anesthetic medicines.  Any surgeries you have had.  Any blood disorders you have.  Any medical conditions you have. What are the risks? Generally, this is a safe procedure. However, problems may occur, including:  Failed induction.  Changes in fetal heart rate, such as being too high, too low, or irregular (erratic).  Infection in the mother or the baby.  Increased risk of having a cesarean delivery.  Breaking off (abruption) of the placenta from the uterus. This is rare.  Rupture of the uterus. This is very rare.  Your baby could fail to get enough blood flow or oxygen. This can be life-threatening. When induction is needed for medical reasons, the benefits generally outweigh the risks. What happens during the procedure? During the procedure, your health care provider will use one of these methods to induce labor:  Stripping the membranes. In this method, the amniotic sac tissue is gently separated from the cervix. This causes the following to happen: ? Your cervix stretches, which in turn causes the release of prostaglandins. ? Prostaglandins induce labor and cause the uterus to contract. ? This procedure is often done in an office visit. You will be sent home to wait for contractions to begin.  Prostaglandin medicine. This medicine starts contractions and causes the cervix to dilate and ripen. This can be taken by mouth (orally) or by being inserted into the vagina (suppository).  Inserting a small, thin tube (catheter) with a balloon into the vagina and then expanding the balloon with water to dilate the cervix.  Breaking the water. In this method, a small instrument is used to make a small hole in the amniotic sac. This eventually  causes the amniotic sac to break. Contractions should begin within a few hours.  Medicine to trigger or strengthen contractions. This medicine is given through an IV that is inserted into a vein in your arm. This procedure may vary among health care providers and hospitals.   Where to find more information  [employer]: www.marchofdimes.org  [employer]: www.acog.org Summary  Labor induction causes a pregnant woman's uterus to contract. It also causes the cervix to soften (ripen), open (dilate), and thin out.  Labor is usually not induced before 39 weeks of pregnancy unless there is a medical reason to do so.  When induction is needed for medical reasons, the benefits generally outweigh the risks.  Talk with your  health care provider about which methods of labor induction are right for you. This information is not intended to replace advice given to you by your health care provider. Make sure you discuss any questions you have with your health care provider. Document Revised: 10/02/2019 Document Reviewed: 10/02/2019 Elsevier Patient Education  2021 [employer].

## 2020-03-12 NOTE — Progress Notes (Signed)
°  Patient no show for 39 week OB f/u appt. Contacted patient via telephone. No answer, voicemail left requesting reschedule if possible.

## 2020-03-12 NOTE — Progress Notes (Signed)
  [facility] Prenatal Visit  Kristen Dean is a 20 y.o. [phone] at [redacted]w[redacted]d here for routine follow up. She is dated by early ultrasound.  She reports nausea and no leaking. She reports fetal movement. She denies vaginal bleeding,reports 10 min intervals of contractions that are 30 mins apart, or loss of fluid. See flow sheet for details.  Vitals:   03/12/20 1447  BP: 122/75  Pulse: 76   General: female appearing stated age in no acute distress Cardio: Normal S1 and S2, no S3 or S4. Rhythm is regular Pulm: CTAB without wheezing Abdomen: gravid uterus, no abdominal tenderness Extremities: trace peripheral edema. Warm/ well perfused.   A/P: Pregnancy at [redacted]w[redacted]d.  Doing well.   1. Routine prenatal care:  Marland Kitchen Dating reviewed, dating tab is correct . Fetal heart tones Appropriate . Fundal height within expected range.  . Fetal position confirmed Vertex using Ultrasound . At prior visit  . Infant feeding choice: Both  . Contraception choice: Pills  . Infant circumcision desired no . Influenza vaccine previously administered.   . Tdap previously administered between 27-36 weeks  . GBS and gc/chlamydia testing results were not reviewed today.   . Pregnancy education regarding labor, fetal movement,  benefits of breastfeeding, contraception, and safe infant sleep were discussed.  . Labor and fetal movement precautions reviewed. . Induction of labor discussed. Scheduled for induction at approximately 41 weeks. BPP scheduled between 40-41 weeks. 03/17/20 for BPP and IOL 3/20 23:45  2. Pregnancy issues include the following and were addressed as appropriate today:   . Problem list and pregnancy box updated: Yes.   Follow up 1 week.

## 2020-03-14 ENCOUNTER — Other Ambulatory Visit: Payer: Self-pay

## 2020-03-14 ENCOUNTER — Inpatient Hospital Stay (HOSPITAL_COMMUNITY): Payer: Medicaid Other | Admitting: Anesthesiology

## 2020-03-14 ENCOUNTER — Inpatient Hospital Stay (HOSPITAL_COMMUNITY)
Admission: AD | Admit: 2020-03-14 | Discharge: 2020-03-16 | DRG: 807 | Disposition: A | Discharge status: Home / Self Care | Payer: Medicaid Other | Attending: Family Medicine | Admitting: Family Medicine

## 2020-03-14 ENCOUNTER — Encounter (HOSPITAL_COMMUNITY): Payer: Self-pay | Admitting: Family Medicine

## 2020-03-14 DIAGNOSIS — Z3A39 39 weeks gestation of pregnancy: Secondary | ICD-10-CM

## 2020-03-14 DIAGNOSIS — F819 Developmental disorder of scholastic skills, unspecified: Secondary | ICD-10-CM | POA: Diagnosis present

## 2020-03-14 DIAGNOSIS — O9852 Other viral diseases complicating childbirth: Secondary | ICD-10-CM | POA: Diagnosis not present

## 2020-03-14 DIAGNOSIS — O9962 Diseases of the digestive system complicating childbirth: Principal | ICD-10-CM | POA: Diagnosis present

## 2020-03-14 DIAGNOSIS — O26893 Other specified pregnancy related conditions, third trimester: Secondary | ICD-10-CM | POA: Diagnosis not present

## 2020-03-14 DIAGNOSIS — Z20822 Contact with and (suspected) exposure to covid-19: Secondary | ICD-10-CM | POA: Diagnosis not present

## 2020-03-14 DIAGNOSIS — O99214 Obesity complicating childbirth: Secondary | ICD-10-CM | POA: Diagnosis present

## 2020-03-14 DIAGNOSIS — O479 False labor, unspecified: Secondary | ICD-10-CM | POA: Diagnosis not present

## 2020-03-14 DIAGNOSIS — K219 Gastro-esophageal reflux disease without esophagitis: Secondary | ICD-10-CM | POA: Diagnosis present

## 2020-03-14 DIAGNOSIS — O99213 Obesity complicating pregnancy, third trimester: Secondary | ICD-10-CM | POA: Diagnosis present

## 2020-03-14 DIAGNOSIS — U071 COVID-19: Secondary | ICD-10-CM | POA: Diagnosis not present

## 2020-03-14 LAB — TYPE AND SCREEN
ABO/RH(D): A POS
Antibody Screen: NEGATIVE

## 2020-03-14 LAB — CBC
HCT: 36.1 % (ref 36.0–46.0)
Hemoglobin: 11.9 g/dL — ABNORMAL LOW (ref 12.0–15.0)
MCH: 28.3 pg (ref 26.0–34.0)
MCHC: 33 g/dL (ref 30.0–36.0)
MCV: 85.7 fL (ref 80.0–100.0)
Platelets: 261 10*3/uL (ref 150–400)
RBC: 4.21 MIL/uL (ref 3.87–5.11)
RDW: 15.4 % (ref 11.5–15.5)
WBC: 12.8 10*3/uL — ABNORMAL HIGH (ref 4.0–10.5)
nRBC: 0 % (ref 0.0–0.2)

## 2020-03-14 LAB — RESP PANEL BY RT-PCR (FLU A&B, COVID) ARPGX2
Influenza A by PCR: NEGATIVE
Influenza B by PCR: NEGATIVE
SARS Coronavirus 2 by RT PCR: NEGATIVE

## 2020-03-14 MED ORDER — TERBUTALINE SULFATE 1 MG/ML IJ SOLN
INTRAMUSCULAR | Status: AC
  Administered 2020-03-14: 0.25 mg via SUBCUTANEOUS
  Filled 2020-03-14: qty 1

## 2020-03-14 MED ORDER — EPHEDRINE 5 MG/ML INJ: 10.0000 mg | INTRAVENOUS | Status: DC | PRN

## 2020-03-14 MED ORDER — DIPHENHYDRAMINE HCL 25 MG PO CAPS: 25.0000 mg | ORAL_CAPSULE | Freq: Four times a day (QID) | ORAL | Status: DC | PRN

## 2020-03-14 MED ORDER — WITCH HAZEL-GLYCERIN EX PADS: 1.0000 "application " | MEDICATED_PAD | CUTANEOUS | Status: DC | PRN

## 2020-03-14 MED ORDER — PRENATAL MULTIVITAMIN CH
1.0000 | ORAL_TABLET | Freq: Every day | ORAL | Status: DC
  Administered 2020-03-15: 1 via ORAL
  Filled 2020-03-14: qty 1

## 2020-03-14 MED ORDER — BENZOCAINE-MENTHOL 20-0.5 % EX AERO
1.0000 "application " | INHALATION_SPRAY | CUTANEOUS | Status: DC | PRN
  Administered 2020-03-14: 1 via TOPICAL
  Filled 2020-03-14: qty 56

## 2020-03-14 MED ORDER — LIDOCAINE HCL (PF) 1 % IJ SOLN: 30.0000 mL | INTRAMUSCULAR | Status: DC | PRN

## 2020-03-14 MED ORDER — ACETAMINOPHEN 325 MG PO TABS: 650.0000 mg | ORAL_TABLET | ORAL | Status: DC | PRN

## 2020-03-14 MED ORDER — IBUPROFEN 600 MG PO TABS
600.0000 mg | ORAL_TABLET | Freq: Four times a day (QID) | ORAL | Status: DC
Start: 2020-03-15 — End: 2020-03-16
  Administered 2020-03-14 – 2020-03-16 (×6): 600 mg via ORAL
  Filled 2020-03-14 (×6): qty 1

## 2020-03-14 MED ORDER — LIDOCAINE HCL (PF) 1 % IJ SOLN
INTRAMUSCULAR | Status: DC | PRN
  Administered 2020-03-14: 5 mL via EPIDURAL

## 2020-03-14 MED ORDER — ONDANSETRON HCL 4 MG/2ML IJ SOLN: 4.0000 mg | Freq: Four times a day (QID) | INTRAMUSCULAR | Status: DC | PRN

## 2020-03-14 MED ORDER — ONDANSETRON HCL 4 MG/2ML IJ SOLN: 4.0000 mg | INTRAMUSCULAR | Status: DC | PRN

## 2020-03-14 MED ORDER — LACTATED RINGERS IV SOLN: INTRAVENOUS | Status: DC

## 2020-03-14 MED ORDER — LACTATED RINGERS IV SOLN: 500.0000 mL | Freq: Once | INTRAVENOUS | Status: DC

## 2020-03-14 MED ORDER — ZOLPIDEM TARTRATE 5 MG PO TABS: 5.0000 mg | ORAL_TABLET | Freq: Every evening | ORAL | Status: DC | PRN

## 2020-03-14 MED ORDER — SIMETHICONE 80 MG PO CHEW: 80.0000 mg | CHEWABLE_TABLET | ORAL | Status: DC | PRN

## 2020-03-14 MED ORDER — LACTATED RINGERS AMNIOINFUSION: INTRAVENOUS | Status: DC

## 2020-03-14 MED ORDER — LACTATED RINGERS IV SOLN: 500.0000 mL | INTRAVENOUS | Status: DC | PRN

## 2020-03-14 MED ORDER — OXYTOCIN BOLUS FROM INFUSION
333.0000 mL | Freq: Once | INTRAVENOUS | Status: AC
  Administered 2020-03-14: 333 mL via INTRAVENOUS

## 2020-03-14 MED ORDER — COCONUT OIL OIL
1.0000 "application " | TOPICAL_OIL | Status: DC | PRN
  Administered 2020-03-15: 1 via TOPICAL

## 2020-03-14 MED ORDER — TERBUTALINE SULFATE 1 MG/ML IJ SOLN: 0.2500 mg | Freq: Once | INTRAMUSCULAR | Status: AC

## 2020-03-14 MED ORDER — OXYCODONE-ACETAMINOPHEN 5-325 MG PO TABS: 1.0000 | ORAL_TABLET | ORAL | Status: DC | PRN

## 2020-03-14 MED ORDER — ACETAMINOPHEN 325 MG PO TABS
650.0000 mg | ORAL_TABLET | ORAL | Status: DC | PRN
  Administered 2020-03-14 – 2020-03-15 (×3): 650 mg via ORAL
  Filled 2020-03-14 (×3): qty 2

## 2020-03-14 MED ORDER — FENTANYL-BUPIVACAINE-NACL 0.5-0.125-0.9 MG/250ML-% EP SOLN
12.0000 mL/h | EPIDURAL | Status: DC | PRN
Start: 2020-03-14 — End: 2020-03-14
  Filled 2020-03-14: qty 250

## 2020-03-14 MED ORDER — DIBUCAINE (PERIANAL) 1 % EX OINT: 1.0000 "application " | TOPICAL_OINTMENT | CUTANEOUS | Status: DC | PRN

## 2020-03-14 MED ORDER — OXYTOCIN-SODIUM CHLORIDE 30-0.9 UT/500ML-% IV SOLN
2.5000 [IU]/h | INTRAVENOUS | Status: DC
  Filled 2020-03-14: qty 500

## 2020-03-14 MED ORDER — FENTANYL-BUPIVACAINE-NACL 0.5-0.125-0.9 MG/250ML-% EP SOLN
EPIDURAL | Status: DC | PRN
  Administered 2020-03-14: 12 mL/h via EPIDURAL

## 2020-03-14 MED ORDER — TETANUS-DIPHTH-ACELL PERTUSSIS 5-2.5-18.5 LF-MCG/0.5 IM SUSY: 0.5000 mL | PREFILLED_SYRINGE | Freq: Once | INTRAMUSCULAR | Status: DC

## 2020-03-14 MED ORDER — PHENYLEPHRINE 40 MCG/ML (10ML) SYRINGE FOR IV PUSH (FOR BLOOD PRESSURE SUPPORT)
80.0000 ug | PREFILLED_SYRINGE | INTRAVENOUS | Status: DC | PRN
  Administered 2020-03-14: 80 ug via INTRAVENOUS

## 2020-03-14 MED ORDER — SENNOSIDES-DOCUSATE SODIUM 8.6-50 MG PO TABS
2.0000 | ORAL_TABLET | Freq: Every day | ORAL | Status: DC
  Administered 2020-03-15 – 2020-03-16 (×2): 2 via ORAL
  Filled 2020-03-14 (×2): qty 2

## 2020-03-14 MED ORDER — ONDANSETRON HCL 4 MG PO TABS: 4.0000 mg | ORAL_TABLET | ORAL | Status: DC | PRN

## 2020-03-14 MED ORDER — SOD CITRATE-CITRIC ACID 500-334 MG/5ML PO SOLN: 30.0000 mL | ORAL | Status: DC | PRN

## 2020-03-14 MED ORDER — DIPHENHYDRAMINE HCL 50 MG/ML IJ SOLN: 12.5000 mg | INTRAMUSCULAR | Status: DC | PRN

## 2020-03-14 MED ORDER — OXYCODONE-ACETAMINOPHEN 5-325 MG PO TABS
2.0000 | ORAL_TABLET | ORAL | Status: DC | PRN
Start: 2020-03-14 — End: 2020-03-14

## 2020-03-14 MED ORDER — PHENYLEPHRINE 40 MCG/ML (10ML) SYRINGE FOR IV PUSH (FOR BLOOD PRESSURE SUPPORT)
80.0000 ug | PREFILLED_SYRINGE | INTRAVENOUS | Status: DC | PRN
  Filled 2020-03-14: qty 10

## 2020-03-14 NOTE — Progress Notes (Addendum)
Called to room by RN at 1136 to assess patient after patient had deceleration into 90s that did not resolved with repositioning; patient now 8.5 cm, terbutaline given, IV fluids bolus given and will give ephedrine for hypotension; Dr. Shawnie Pons at the bedside. NST : 110 bpm, mod var, present acel, no decels,  FSE removed due to lack of contact and now using external FM. Continue amnioinfusion, continue close monitoring.   Luna Kitchens

## 2020-03-14 NOTE — Progress Notes (Addendum)
   Kristen Dean is a 20 y.o. [phone] at [redacted]w[redacted]d  admitted for active labor  Subjective: Comfortable with epidural at this time. No urge to push right now.   Objective: Vitals:   03/14/20 1430 03/14/20 1435 03/14/20 1500 03/14/20 1530  BP: (!) 113/48 118/61 111/76 127/61  Pulse: 90 78 83 78  Resp:      Temp:      TempSrc:      SpO2: 100% 99%     No intake/output data recorded.  FHT:  FHR: 115 bpm, variability: moderate,  accelerations:  Present,  decelerations:  Present deep variables with spontaneous return to baseline UC:   irregular, every 2- minutes SVE:   Dilation: 10 Effacement (%): 100 Station: 0 Exam by:: Ralph Leyden CNM  Labs: Lab Results  Component Value Date   WBC 12.8 (H) 03/14/2020   HGB 11.9 (L) 03/14/2020   HCT 36.1 03/14/2020   MCV 85.7 03/14/2020   PLT 261 03/14/2020    Assessment / Plan:  Patient now with Category 1 tracing after interventions;  Labor: 10 cm dilated at this time. active labor; anticipate NSVD soon. CNM on standby.  We will let her labor down and plan to start pushing soon.  Fetal Wellbeing:  Category I Pain Control:  Epidural Anticipated MOD:  NSVD  Derrel Nip 03/14/2020, 3:44 PM

## 2020-03-14 NOTE — Anesthesia Preprocedure Evaluation (Addendum)
Anesthesia Evaluation  Patient identified by MRN, date of birth, ID band Patient awake    Reviewed: Allergy & Precautions, NPO status , Patient's Chart, lab work & pertinent test results  Airway Mallampati: II  TM Distance: >3 FB Neck ROM: Full    Dental no notable dental hx. (+) Teeth Intact, Dental Advisory Given   Pulmonary  covid 19 PNd   Pulmonary exam normal breath sounds clear to auscultation       Cardiovascular negative cardio ROS Normal cardiovascular exam Rhythm:Regular Rate:Normal     Neuro/Psych negative neurological ROS     GI/Hepatic Neg liver ROS, GERD  ,  Endo/Other  negative endocrine ROS  Renal/GU negative Renal ROS     Musculoskeletal   Abdominal   Peds  Hematology Lab Results      Component                Value               Date                      WBC                      12.8 (H)            03/14/2020                HGB                      11.9 (L)            03/14/2020                HCT                      36.1                03/14/2020                MCV                      85.7                03/14/2020                PLT                      261                 03/14/2020              Anesthesia Other Findings   Reproductive/Obstetrics (+) Pregnancy                            Anesthesia Physical Anesthesia Plan  ASA: II  Anesthesia Plan: Epidural   Post-op Pain Management:    Induction:   PONV Risk Score and Plan:   Airway Management Planned:   Additional Equipment:   Intra-op Plan:   Post-operative Plan:   Informed Consent: I have reviewed the patients History and Physical, chart, labs and discussed the procedure including the risks, benefits and alternatives for the proposed anesthesia with the patient or authorized representative who has indicated his/her understanding and acceptance.       Plan Discussed with:   Anesthesia Plan  Comments: (39.6 wk G4P1 for LEA)  Anesthesia Quick Evaluation  

## 2020-03-14 NOTE — Anesthesia Procedure Notes (Signed)
Epidural Patient location during procedure: OB Start time: 03/14/2020 10:53 AM End time: 03/14/2020 11:03 AM  Staffing Anesthesiologist: Trevor Iha, MD Performed: anesthesiologist   Preanesthetic Checklist Completed: patient identified, IV checked, site marked, risks and benefits discussed, surgical consent, monitors and equipment checked, pre-op evaluation and timeout performed  Epidural Patient position: sitting Prep: DuraPrep and site prepped and draped Patient monitoring: continuous pulse ox and blood pressure Approach: midline Location: L3-L4 Injection technique: LOR air  Needle:  Needle type: Tuohy  Needle gauge: 17 G Needle length: 9 cm and 9 Needle insertion depth: 6 cm Catheter type: closed end flexible Catheter size: 19 Gauge Catheter at skin depth: 12 cm Test dose: negative  Assessment Events: blood not aspirated, injection not painful, no injection resistance, no paresthesia and negative IV test  Additional Notes Patient identified. Risks/Benefits/Options discussed with patient including but not limited to bleeding, infection, nerve damage, paralysis, failed block, incomplete pain control, headache, blood pressure changes, nausea, vomiting, reactions to medication both or allergic, itching and postpartum back pain. Confirmed with bedside nurse the patient's most recent platelet count. Confirmed with patient that they are not currently taking any anticoagulation, have any bleeding history or any family history of bleeding disorders. Patient expressed understanding and wished to proceed. All questions were answered. Sterile technique was used throughout the entire procedure. Please see nursing notes for vital signs. Test dose was given through epidural needle and negative prior to continuing to dose epidural or start infusion. Warning signs of high block given to the patient including shortness of breath, tingling/numbness in hands, complete motor block, or any  concerning symptoms with instructions to call for help. Patient was given instructions on fall risk and not to get out of bed. All questions and concerns addressed with instructions to call with any issues. 1 Attempt (S) . Patient tolerated procedure well.

## 2020-03-14 NOTE — Progress Notes (Addendum)
OBSTETRIC ADMISSION HISTORY AND PHYSICAL  Kristen Dean is a 20 y.o. female [phone] with IUP at [redacted]w[redacted]d by LMP+ 6wk u/s presenting for SOL. She reports +FMs, No LOF, no VB, no blurry vision, headaches or peripheral edema, and RUQ pain.  She plans on formula feeding. She request OCPs for birth control. She received her prenatal care at [facility]   Dating: By 6 wk u/s --->  Estimated Date of Delivery: 03/15/20  Sono:    @25w  6d, CWD, normal anatomy, vertex  997 grams  Prenatal History/Complications:   Past Medical History: Past Medical History:  Diagnosis Date  . GERD (gastroesophageal reflux disease) 08/08/2017  . Medical history non-contributory     Past Surgical History: Past Surgical History:  Procedure Laterality Date  . NO PAST SURGERIES      Obstetrical History: OB History    Gravida  4   Para  1   Term  1   Preterm      AB  2   Living  1     SAB  2   IAB      Ectopic      Multiple  0   Live Births  1        Obstetric Comments  Pregnancy #2 Baby Boy, Vit D Deficiency, Fetal Pyelectasis that resolved        Social History Social History   Socioeconomic History  . Marital status: Single    Spouse name: Not on file  . Number of children: Not on file  . Years of education: Not on file  . Highest education level: Not on file  Occupational History  . Not on file  Tobacco Use  . Smoking status: Never Smoker  . Smokeless tobacco: Never Used  Vaping Use  . Vaping Use: Never used  Substance and Sexual Activity  . Alcohol use: No  . Drug use: No  . Sexual activity: Not Currently    Birth control/protection: None  Other Topics Concern  . Not on file  Social History Narrative  . Not on file   Social Determinants of Health   Financial Resource Strain: Not on file  Food Insecurity: Not on file  Transportation Needs: Not on file  Physical Activity: Not on file  Stress: Not on file  Social Connections: Not on file    Family  History: Family History  Problem Relation Age of Onset  . Diabetes Mother   . Hypertension Mother   . Diabetes Maternal Aunt   . Diabetes Maternal Grandmother   . Diabetes Sister   . Mental retardation Sister   . Diabetes Maternal Uncle   . Diabetes Maternal Grandfather     Allergies: No Known Allergies  Medications Prior to Admission  Medication Sig Dispense Refill Last Dose  . acetaminophen (TYLENOL) 500 MG tablet Take 2 tablets (1,000 mg total) by mouth every 6 (six) hours as needed for moderate pain. 30 tablet 0   . Ergocalciferol 10 MCG (400 UNIT) TABS Take 800 Units by mouth every morning. 2 tablets daily 90 tablet 1   . omeprazole (PRILOSEC) 20 MG capsule Take 1 capsule (20 mg total) by mouth daily. 30 capsule 2   . Prenatal Vit-Fe Fumarate-FA (PREPLUS) 27-1 MG TABS Take 1 tablet by mouth daily. (Patient not taking: No sig reported) 30 tablet 13      Review of Systems   All systems reviewed and negative except as stated in HPI  Last menstrual period 06/06/2019, unknown if currently breastfeeding. General  appearance: alert, cooperative and no distress Lungs: clear to auscultation bilaterally Heart: regular rate and rhythm Abdomen: soft, non-tender; bowel sounds normal Pelvic: NEFG Extremities: Homans sign is negative, no sign of DVT DTR's not done Presentation: cephalic Fetal monitoringBaseline: 115 bpm; mod var, present acel, variables with contractions Uterine activityq2     Prenatal labs: ABO, Rh: A/Positive/-- (07/06 1236) Antibody: Negative (07/06 1236) Rubella: 13.00 (07/06 1236) RPR: Non Reactive (12/22 1228)  HBsAg: Negative (07/06 1236)  HIV: Non Reactive (12/22 1228)  GBS: Negative/-- (03/02 1415)  1 hr Glucola 96 Genetic screening  Not done Anatomy US normal  Prenatal Transfer Tool  Maternal Diabetes: No Genetic Screening: Declined Maternal Ultrasounds/Referrals: Normal Fetal Ultrasounds or other Referrals:  None Maternal Substance Abuse:   No Significant Maternal Medications:  None Significant Maternal Lab Results: None and Group B Strep negative  No results found for this or any previous visit (from the past 24 hour(s)).  Patient Active Problem List   Diagnosis Date Noted  . Chest pain, unspecified 11/11/2019  . Obesity affecting pregnancy in third trimester 08/22/2019  . Supervision of low-risk pregnancy, third trimester 07/17/2019  . GERD (gastroesophageal reflux disease) 08/08/2017  . Vitamin D deficiency 02/21/2015  . Learning disability 01/27/2013    Assessment/Plan:  Kristen Dean is a 20 y.o. G4P1021 at 101w6d here for SOL.   #Labor:Active #Pain: Desires epidural #FWB: CAT 2 #ID:  GBS neg, COVID pending #MOF: bottle #MOC:POPs #Circ:  No circ  Derrel Nip, MD  03/14/2020, 9:40 AM  Attestation of Supervision of Student:  I confirm that I have verified the information documented in the resident student's note and that I have also personally reperformed the history, physical exam and all medical decision making activities.  I have verified that all services and findings are accurately documented in this student's note; and I agree with management and plan as outlined in the documentation. I have also made any necessary editorial changes.  -Patient is 6 cm, will admit to L and D with expectant management.   Marylene Land, CNM [facility] [employer], [facility] 03/18/2020 11:01 AM

## 2020-03-14 NOTE — Discharge Summary (Signed)
Postpartum Discharge Summary  Patient Name: Kristen Dean DOB: May 16, 2000 MRN: [id number]  Date of admission: 03/14/2020 Delivery date:03/14/2020  Delivering provider: Gifford Shave  Date of discharge: 03/16/2020  Admitting diagnosis: Indication for care in labor or delivery [O75.9] Intrauterine pregnancy: [redacted]w[redacted]d    Secondary diagnosis:  Principal Problem:   Vaginal delivery Active Problems:   Learning disability   Obesity affecting pregnancy in third trimester   Indication for care in labor and delivery, antepartum  Additional problems:     Discharge diagnosis: Term Pregnancy Delivered                                              Post partum procedures:None Augmentation: None Complications: None  Hospital course: Onset of Labor With Vaginal Delivery      20y.o. yo [id number] admitted in Active Labor on 03/14/2020. Patient had an uncomplicated labor course as follows: Patient arrived at 6 cm with bulging bag of water; was brought immediately to labor room. She had SROM at 1017 for copious thick meconium stained fluid, and continued to leak meconium-stained fluid during her labor. At times she had Cat 2 tracing which resolved with amnioinfusion, terbutaline and repositioning. She progressed to complete without augmentation and delivered after a short second stage. 2nd degree tear repaired.   Membrane Rupture Time/Date: 10:17 AM ,03/14/2020   Delivery Method:Vaginal, Spontaneous  Episiotomy: None  Lacerations:  2nd degree  Patient had an uncomplicated postpartum course.  She is ambulating, tolerating a regular diet, passing flatus, and urinating well. Patient is discharged home in stable condition on 03/16/20.  Newborn Data: Birth date:03/14/2020  Birth time:4:25 PM  Gender:Female  Living status:Living  Apgars:8 ,9  Weight:3629 g   Magnesium Sulfate received: No BMZ received: No Rhophylac:No MMR:No T-DaP:Given prenatally Flu: Yes Transfusion:No  Physical  exam  Vitals:   03/15/20 0420 03/15/20 0904 03/15/20 2053 03/16/20 0530  BP: 117/64 112/62 125/71 108/60  Pulse: 65 68 66 60  Resp: _0 Temp: 97.6 F (36.4 C) 97.6 F (36.4 C) 98.1 F (36.7 C) 98.4 F (36.9 C)  TempSrc: Oral Oral Oral Oral  SpO2:   98%    General: alert, cooperative and no distress Lochia: appropriate Uterine Fundus: firm Incision: N/A DVT Evaluation: No evidence of DVT seen on physical exam. No cords or calf tenderness. No significant calf/ankle edema. Labs: Lab Results  Component Value Date   WBC 12.8 (H) 03/14/2020   HGB 11.9 (L) 03/14/2020   HCT 36.1 03/14/2020   MCV 85.7 03/14/2020   PLT 261 03/14/2020   CMP Latest Ref Rng & Units 07/22/2019  Glucose 70 - 99 mg/dL 89  BUN 6 - 20 mg/dL 5(L)  Creatinine 0.44 - 1.00 mg/dL 0.72  Sodium 135 - 145 mmol/L 135  Potassium 3.5 - 5.1 mmol/L 3.8  Chloride 98 - 111 mmol/L 106  CO2 22 - 32 mmol/L 22  Calcium 8.9 - 10.3 mg/dL 9.1  Total Protein 6.5 - 8.1 g/dL 7.2  Total Bilirubin 0.3 - 1.2 mg/dL 0.4  Alkaline Phos 38 - 126 U/L 52  AST 15 - 41 U/L 15  ALT 0 - 44 U/L 12   Edinburgh Score: Edinburgh Postnatal Depression Scale Screening Tool 03/15/2020  I have been able to laugh and see the funny side of things. 0  I have looked forward  with enjoyment to things. 0  I have blamed myself unnecessarily when things went wrong. 0  I have been anxious or worried for no good reason. 0  I have felt scared or panicky for no good reason. 0  Things have been getting on top of me. 0  I have been so unhappy that I have had difficulty sleeping. 0  I have felt sad or miserable. 0  I have been so unhappy that I have been crying. 0  The thought of harming myself has occurred to me. 0  Edinburgh Postnatal Depression Scale Total 0     After visit meds:  Allergies as of 03/16/2020   No Known Allergies     Medication List    STOP taking these medications   omeprazole 20 MG capsule Commonly known as:  PriLOSEC   ondansetron 4 MG disintegrating tablet Commonly known as: ZOFRAN-ODT     TAKE these medications   acetaminophen 500 MG tablet Commonly known as: TYLENOL Take 2 tablets (1,000 mg total) by mouth every 6 (six) hours as needed for moderate pain.   coconut oil Oil Apply 1 application topically as needed (nipple pain).   ibuprofen 600 MG tablet Commonly known as: ADVIL Take 1 tablet (600 mg total) by mouth every 8 (eight) hours as needed for moderate pain or cramping.   norethindrone 0.35 MG tablet Commonly known as: Ortho Micronor Take 1 tablet (0.35 mg total) by mouth daily.   prenatal multivitamin Tabs tablet Take 1 tablet by mouth daily at 12 noon.       Discharge home in stable condition Infant Feeding: breast and bottle Infant Disposition:home with mother Discharge instruction: per After Visit Summary and Postpartum booklet. Activity: Advance as tolerated. Pelvic rest for 6 weeks.  Diet: routine diet Future Appointments: No future appointments. Follow up Visit: Please schedule this patient for a In person postpartum visit in 6 weeks with the following provider: Any provider. Additional Postpartum F/U:None  Low risk pregnancy complicated by: obesity, learning disability Delivery mode:  Vaginal, Spontaneous  Anticipated Birth Control:  POPs  Betti Goodenow, Gildardo Cranker, MD OB Fellow, Faculty Practice 03/16/2020 8:44 AM

## 2020-03-15 DIAGNOSIS — Z3A39 39 weeks gestation of pregnancy: Secondary | ICD-10-CM | POA: Diagnosis not present

## 2020-03-15 LAB — RPR: RPR Ser Ql: NONREACTIVE

## 2020-03-15 NOTE — Anesthesia Postprocedure Evaluation (Signed)
Anesthesia Post Note  Patient: Kristen Dean  Procedure(s) Performed: AN AD HOC LABOR EPIDURAL     Patient location during evaluation: Mother Baby Anesthesia Type: Epidural Level of consciousness: awake and alert Pain management: pain level controlled Vital Signs Assessment: post-procedure vital signs reviewed and stable Respiratory status: spontaneous breathing, nonlabored ventilation and respiratory function stable Cardiovascular status: stable Postop Assessment: no headache, no backache, epidural receding, no apparent nausea or vomiting, patient able to bend at knees, adequate PO intake and able to ambulate Anesthetic complications: no   No complications documented.  Last Vitals:  Vitals:   03/14/20 2344 03/15/20 0420  BP: 110/82 117/64  Pulse: 77 65  Resp: 18 18  Temp: (!) 36.4 C 36.4 C  SpO2:      Last Pain:  Vitals:   03/15/20 0513  TempSrc:   PainSc: 7    Pain Goal: Patients Stated Pain Goal: 2 (03/15/20 0513)                 Laban Emperor

## 2020-03-15 NOTE — Progress Notes (Addendum)
POSTPARTUM PROGRESS NOTE  Subjective: Kristen Dean is a 20 y.o. [id number] s/p NSVD at [redacted]w[redacted]d.  She reports she doing well. No acute events overnight. She denies any problems with ambulating, voiding or po intake. Denies nausea or vomiting. Pain is well controlled.  Lochia is appropriate.  Objective: Blood pressure 117/64, pulse 65, temperature 97.6 F (36.4 C), temperature source Oral, resp. rate 18, last menstrual period 06/06/2019, SpO2 99 %, unknown if currently breastfeeding.  Physical Exam:  General: alert, cooperative and no distress Chest: no respiratory distress Abdomen: soft, appropriately tender Uterine Fundus: firm and at level of umbilicus Extremities: No calf swelling or tenderness  Recent Labs    03/14/20 0955  HGB 11.9*  HCT 36.1    Assessment/Plan: Kristen Dean is a 20 y.o. [id number] s/p NSVD at [redacted]w[redacted]d.  Routine Postpartum Care: Doing well, pain well-controlled.  -- Continue routine care  -- Contraception: POPs -- Feeding: breast and formula, lactation support prn  Dispo: Plan for discharge tomorrow.  Maury Dus, MD PGY-1 Family Medicine 03/15/2020 7:21 AM  Attestation of Supervision of Student:  I confirm that I have verified the information documented in the  resident's  note and that I have also personally reperformed the history, physical exam and all medical decision making activities.  I have verified that all services and findings are accurately documented in this student's note; and I agree with management and plan as outlined in the documentation. I have also made any necessary editorial changes.  Sheila Oats, MD [facility] for [facility], [facility] 03/15/2020 8:50 AM

## 2020-03-15 NOTE — Discharge Instructions (Signed)

## 2020-03-16 DIAGNOSIS — Z3A39 39 weeks gestation of pregnancy: Secondary | ICD-10-CM | POA: Diagnosis not present

## 2020-03-16 MED ORDER — NORETHINDRONE 0.35 MG PO TABS: 1.0000 | ORAL_TABLET | Freq: Every day | ORAL | 6 refills | Status: DC

## 2020-03-16 MED ORDER — IBUPROFEN 600 MG PO TABS: 600.0000 mg | ORAL_TABLET | Freq: Three times a day (TID) | ORAL | 0 refills | Status: DC | PRN

## 2020-03-16 MED ORDER — PRENATAL MULTIVITAMIN CH: 1.0000 | ORAL_TABLET | Freq: Every day | ORAL | Status: DC

## 2020-03-16 MED ORDER — COCONUT OIL OIL: 1.0000 "application " | TOPICAL_OIL | 0 refills | Status: DC | PRN

## 2020-03-17 ENCOUNTER — Telehealth: Payer: Self-pay | Admitting: *Deleted

## 2020-03-17 ENCOUNTER — Other Ambulatory Visit: Payer: Medicaid Other

## 2020-03-17 NOTE — Telephone Encounter (Signed)
Transition Care Management Unsuccessful Follow-up Telephone Call  Date of discharge and from where:  03/16/2020 -  [facility]  Attempts:  1st Attempt  Reason for unsuccessful TCM follow-up call:  Voice mail full

## 2020-03-18 NOTE — Telephone Encounter (Signed)
Transition Care Management Unsuccessful Follow-up Telephone Call  Date of discharge and from where:  03/16/2020 from [facility]  Attempts:  2nd Attempt  Reason for unsuccessful TCM follow-up call:  Left voice message   .

## 2020-03-19 NOTE — Telephone Encounter (Signed)
Transition Care Management Unsuccessful Follow-up Telephone Call  Date of discharge and from where:  03/16/2020 - [facility]  Attempts:  3rd Attempt  Reason for unsuccessful TCM follow-up call:  Left voice message

## 2020-03-22 ENCOUNTER — Inpatient Hospital Stay (HOSPITAL_COMMUNITY)
Admission: AD | Admit: 2020-03-22 | Payer: Medicaid Other | Source: Home / Self Care | Admitting: Obstetrics & Gynecology

## 2020-03-22 ENCOUNTER — Inpatient Hospital Stay (HOSPITAL_COMMUNITY): Payer: Medicaid Other

## 2020-03-30 ENCOUNTER — Other Ambulatory Visit: Payer: Self-pay

## 2020-03-30 ENCOUNTER — Ambulatory Visit (INDEPENDENT_AMBULATORY_CARE_PROVIDER_SITE_OTHER): Payer: Medicaid Other | Admitting: Family Medicine

## 2020-03-30 ENCOUNTER — Encounter: Payer: Self-pay | Admitting: Family Medicine

## 2020-03-30 ENCOUNTER — Other Ambulatory Visit: Payer: Self-pay | Admitting: Family Medicine

## 2020-03-30 VITALS — BP 125/80 | HR 75 | Ht 68.0 in | Wt 212.6 lb

## 2020-03-30 DIAGNOSIS — N760 Acute vaginitis: Secondary | ICD-10-CM

## 2020-03-30 DIAGNOSIS — N6042 Mammary duct ectasia of left breast: Secondary | ICD-10-CM

## 2020-03-30 DIAGNOSIS — Z30011 Encounter for initial prescription of contraceptive pills: Secondary | ICD-10-CM

## 2020-03-30 DIAGNOSIS — Z309 Encounter for contraceptive management, unspecified: Secondary | ICD-10-CM

## 2020-03-30 HISTORY — DX: Mammary duct ectasia of left breast: N60.42

## 2020-03-30 MED ORDER — NORGESTIM-ETH ESTRAD TRIPHASIC 0.18/0.215/0.25 MG-25 MCG PO TABS: 1.0000 | ORAL_TABLET | Freq: Every day | ORAL | 3 refills | Status: DC

## 2020-03-30 MED ORDER — FLUCONAZOLE 150 MG PO TABS: 150.0000 mg | ORAL_TABLET | Freq: Once | ORAL | 0 refills | Status: AC

## 2020-03-30 NOTE — Progress Notes (Signed)
    SUBJECTIVE:   CHIEF COMPLAINT / HPI:  Not breasting. No birth control currently. 2 weeks ago.  Vaginal Itching The patient's primary symptoms include genital itching. Primary symptoms comment: Irritated vulva and itchines which started a week ago. This is a new problem. The problem occurs constantly. Progression since onset: might be getting worse or unchanged. She is not pregnant. Associated symptoms comments: Suprapubic pain since delivery, feels like minor contraction since delivery. Nothing aggravates the symptoms. Treatments tried: Uses water without soap to clean her vulva. The treatment provided mild relief. Sexual activity: Not currently sexually active. She uses nothing (Will like to start OCP) for contraception.   Breast bump:  C/O has a bump on her left breast that started about a week ago. She stopped breastfeeding about two days ago. The bump is mildly painful. She had a similar bump with her previous pregnancy that resolved quickly, unlike this current bump. No other concerns.  Contraceptive management: Will like to get back on her previous OCP, now that she stopped breastfeeding.  She was sent home on progesterone only birth control after her delivery.  PERTINENT  PMH / PSH: PMX reviewed  OBJECTIVE:   BP 125/80   Pulse 75   Ht 5\' 8"  (1.727 m)   Wt 212 lb 9.6 oz (96.4 kg)   SpO2 98%   BMI 32.33 kg/m   Physical Exam Vitals and nursing note reviewed. Exam conducted with a chaperone present ).  Constitutional:      Appearance: Normal appearance.  Cardiovascular:     Rate and Rhythm: Normal rate and regular rhythm.     Heart sounds: Normal heart sounds. No murmur heard.   Pulmonary:     Effort: Pulmonary effort is normal. No respiratory distress.     Breath sounds: Normal breath sounds. No stridor. No wheezing or rhonchi.  Chest:     Comments: Did not remove her bra for a full breast exam. I did palpate over the left breast bump. Non-tender, no  erythema, linear, firm, mass along her breast gland duct, approximately 3 cm in length and 2 cm in width.  Abdominal:     General: Abdomen is flat. There is no distension.     Palpations: Abdomen is soft. There is no mass.     Tenderness: There is no abdominal tenderness.  Genitourinary:    Comments: Speculum exam not attempted - she had vaginal delivery 2 weeks ago with laceration repair. Repair site is well appearing. She does have mild to moderate irritation of her vulva inner mucosa B/L. No ulceration, no discharge     ASSESSMENT/PLAN:  Vaginitis: Unable to complete full pelvic exam at her request which is reasonable. Treat empirically for candida vaginitis, since she had similar presentation in the past which was diagnosed a yeast infection. Diflucan escribed. Return precaution discussed.  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 No signs of infection. I encouraged massaging with warm compress. F/U soon in the next 4 weeks if there is no improvement, or sooner, if it worsens. She agreed with the plan.   Birth control:  I started her on Orthotricyclen lo. D/U ortho Micronor F/U with PCP for further management.  Cleatrice Burke, MD [facility]

## 2020-03-30 NOTE — Patient Instructions (Signed)
Vaginitis  Vaginitis is irritation and swelling of the vagina. Treatment will depend on the cause. What are the causes? It can be caused by:  Bacteria.  Yeast.  A parasite.  A virus.  Low hormone levels.  Bubble baths, scented tampons, and feminine sprays. Other things can change the balance of the yeast and bacteria that live in the vagina. These include:  Antibiotic medicines.  Not being clean enough.  Some birth control methods.  Sex.  Infection.  Diabetes.  A weakened body defense system (immune system). What increases the risk?  Smoking or being around someone who smokes.  Using washes (douches), scented tampons, or scented pads.  Wearing tight pants or thong underwear.  Using birth control pills or an IUD.  Having sex without a condom or having a lot of partners.  Having an STI.  Using a certain product to kill sperm (nonoxynol-9).  Eating foods that are high in sugar.  Having diabetes.  Having low levels of a female hormone.  Having a weakened body defense system.  Being pregnant or breastfeeding. What are the signs or symptoms?  Fluid coming from the vagina that is not normal.  A bad smell.  Itching, pain, or swelling.  Pain with sex.  Pain or burning when you pee (urinate). Sometimes there are no symptoms. How is this treated? Treatment may include:  Antibiotic creams or pills.  Antifungal medicines.  Medicines to ease symptoms if you have a virus. Your sex partner should also be treated.  Estrogen medicines.  Avoiding scented soaps, sprays, or douches.  Stopping use of products that caused irritation and then using a cream to treat symptoms. Follow these instructions at home: Lifestyle  Keep the area around your vagina clean and dry. ? Avoid using soap. ? Rinse the area with water.  Until your doctor says it is okay: ? Do not use washes for the vagina. ? Do not use tampons. ? Do not have sex.  Wipe from front to  back after going to the bathroom.  When your doctor says it is okay, practice safe sex and use condoms. General instructions  Take over-the-counter and prescription medicines only as told by your doctor.  If you were prescribed an antibiotic medicine, take or use it as told by your doctor. Do not stop taking or using it even if you start to feel better.  Keep all follow-up visits. How is this prevented?  Do not use things that can irritate the vagina, such as fabric softeners. Avoid these products if they are scented: ? Sprays. ? Detergents. ? Tampons. ? Products for cleaning the vagina. ? Soaps or bubble baths.  Let air reach your vagina. To do this: ? Wear cotton underwear. ? Do not wear:  Underwear while you sleep.  Tight pants.  Thong underwear.  Underwear or nylons without a cotton panel. ? Take off any wet clothing, such as bathing suits, as soon as you can. ? Practice safe sex and use condoms. Contact a doctor if:  You have pain in your belly or in the area between your hips.  You have a fever or chills.  Your symptoms last for more than 2-3 days. Get help right away if:  You have a fever and your symptoms get worse all of a sudden. Summary  Vaginitis is irritation and swelling of the vagina.  Treatment will depend on the cause of the condition.  Do not use washes or tampons or have sex until your doctor says it   is okay. This information is not intended to replace advice given to you by your health care provider. Make sure you discuss any questions you have with your health care provider. Document Revised: 06/19/2019 Document Reviewed: 06/19/2019 Elsevier Patient Education  2021 [employer]vier Inc.  

## 2020-03-30 NOTE — Assessment & Plan Note (Signed)
I reassured her this is benign and is self limiting. No signs of infection. I encouraged massaging with warm compress. F/U soon in the next 4 weeks if there is no improvement, or sooner, if it worsens. She agreed with the plan.

## 2020-04-19 ENCOUNTER — Ambulatory Visit: Payer: Medicaid Other

## 2020-04-26 NOTE — Progress Notes (Signed)
    SUBJECTIVE:   CHIEF COMPLAINT / HPI: postpartum follow up   Patient reports that overall she feels she is adjusting decently to having two small children. She has been bottle feeding.   She reports that she has some increased stress levels due to challenging dynamics with Noah's dad.  She is using OCPs for contraception and reports sexual intercourse once when she was 5 weeks postpartum. She denies any pain with intercourse. She denies difficulties with voiding or stooling. She reports some vaginal itching and discharge that appears to be like a yeast infection. She is requesting STI testing.    PERTINENT  PMH / PSH:  S/p vaginal delivery   OBJECTIVE:   BP 110/62   Pulse 87   Wt 217 lb (98.4 kg)   SpO2 96%   BMI 32.99 kg/m   General: female appearing stated age in no acute distress Cardio: Normal S1 and S2, no S3 or S4. Rhythm is regular. No murmurs or rubs.  Bilateral radial pulses palpable Pulm: Clear to auscultation bilaterally, no crackles, wheezing, or diminished breath sounds. Normal respiratory effort, stable on RA Abdomen: Bowel sounds normal. Abdomen soft and non-tender.   Genitalia:  Normal introitus for age, no external lesions, green/yellow vaginal discharge pooled in vaginal vault, mucosa pink and moist, no vaginal or cervical lesions, no vaginal atrophy, no friaility or hemorrhage, laceration appears to be well healed no evidence of irritation or bleeding   ASSESSMENT/PLAN:   Postpartum follow-up Patient is s/p vaginal delivery without complication on 03/14/20. Laceration repaired at that time and patient appears to have healed well from that repair.  She has resumed sexual activity  Will do wet prep and test for GC/Chlamydia and HIV per pt request  Edinburgh score of 5       Ronnald Ramp, MD [facility]

## 2020-04-27 ENCOUNTER — Other Ambulatory Visit (HOSPITAL_COMMUNITY)
Admission: RE | Admit: 2020-04-27 | Discharge: 2020-04-27 | Disposition: A | Discharge status: Home / Self Care | Payer: Medicaid Other | Source: Ambulatory Visit | Attending: Family Medicine | Admitting: Family Medicine

## 2020-04-27 ENCOUNTER — Other Ambulatory Visit: Payer: Self-pay

## 2020-04-27 ENCOUNTER — Encounter: Payer: Self-pay | Admitting: Family Medicine

## 2020-04-27 ENCOUNTER — Ambulatory Visit (INDEPENDENT_AMBULATORY_CARE_PROVIDER_SITE_OTHER): Payer: Medicaid Other | Admitting: Family Medicine

## 2020-04-27 VITALS — BP 110/62 | HR 87 | Wt 217.0 lb

## 2020-04-27 DIAGNOSIS — Z7251 High risk heterosexual behavior: Secondary | ICD-10-CM

## 2020-04-27 DIAGNOSIS — N898 Other specified noninflammatory disorders of vagina: Secondary | ICD-10-CM

## 2020-04-27 LAB — POCT WET PREP (WET MOUNT)
Clue Cells Wet Prep Whiff POC: NEGATIVE
Trichomonas Wet Prep HPF POC: ABSENT

## 2020-04-27 NOTE — Patient Instructions (Signed)
It was a pleasure to see you today!  Thank you for choosing [facility] for your primary care.   Marene Gilliam was seen for postpartum follow-up.   Our plans for today were:  As requested, we will test for any sexually transmitted diseases or vaginal infections.  I will notify you of any abnormal results and follow-up with any further planes as needed.  To keep you healthy, please keep in mind the following health maintenance items that you are due for:   1. It is recommended that you have a COVID vaccination.    Best Wishes,   Dr. Neita Garnet

## 2020-04-28 LAB — CERVICOVAGINAL ANCILLARY ONLY
Chlamydia: NEGATIVE
Comment: NEGATIVE
Comment: NORMAL
Neisseria Gonorrhea: NEGATIVE

## 2020-04-28 LAB — HIV ANTIBODY (ROUTINE TESTING W REFLEX): HIV Screen 4th Generation wRfx: NONREACTIVE

## 2020-04-29 NOTE — Assessment & Plan Note (Signed)
Patient is s/p vaginal delivery without complication on 03/14/20. Laceration repaired at that time and patient appears to have healed well from that repair.  She has resumed sexual activity  Will do wet prep and test for GC/Chlamydia and HIV per pt request  Edinburgh score of 5

## 2020-05-04 ENCOUNTER — Ambulatory Visit
Admission: RE | Admit: 2020-05-04 | Discharge: 2020-05-04 | Disposition: A | Discharge status: Home / Self Care | Payer: Medicaid Other | Source: Ambulatory Visit | Attending: Emergency Medicine | Admitting: Emergency Medicine

## 2020-05-04 ENCOUNTER — Other Ambulatory Visit: Payer: Self-pay

## 2020-05-04 VITALS — BP 112/69 | HR 72 | Temp 98.8°F | Resp 18

## 2020-05-04 DIAGNOSIS — J029 Acute pharyngitis, unspecified: Secondary | ICD-10-CM | POA: Diagnosis not present

## 2020-05-04 DIAGNOSIS — L299 Pruritus, unspecified: Secondary | ICD-10-CM | POA: Diagnosis not present

## 2020-05-04 LAB — POCT RAPID STREP A (OFFICE): Rapid Strep A Screen: NEGATIVE

## 2020-05-04 MED ORDER — FLUTICASONE PROPIONATE 50 MCG/ACT NA SUSP: 1.0000 | Freq: Every day | NASAL | 0 refills | Status: DC

## 2020-05-04 MED ORDER — IBUPROFEN 800 MG PO TABS: 800.0000 mg | ORAL_TABLET | Freq: Three times a day (TID) | ORAL | 0 refills | Status: DC

## 2020-05-04 MED ORDER — CETIRIZINE HCL 10 MG PO CAPS: 10.0000 mg | ORAL_CAPSULE | Freq: Every day | ORAL | 0 refills | Status: DC

## 2020-05-04 NOTE — Discharge Instructions (Addendum)
Strep test negative Zyrtec and Flonase daily to help with throat irritation/ear itching Ibuprofen and Tylenol as needed for back and knee pain

## 2020-05-04 NOTE — ED Triage Notes (Signed)
Pt reports itchiness in ear, throat; dry cough and watery eye x 2 days. Denies fever.

## 2020-05-04 NOTE — ED Provider Notes (Signed)
[facility]    CSN: [id number] Arrival date & time: 05/04/20  1146      History   Chief Complaint Chief Complaint  Patient presents with  . Appointment  . Ear Problem    HPI Kristen Dean is a 20 y.o. female presenting today for evaluation of ear itching, throat irritation and dry cough.  Reports symptoms over the past 2 days.  Denies fevers.  Denies any chest pain or shortness of breath.  Denies any close sick contacts.  Denies breast-feeding.  Also reports frequent back pain, knee pain without specific injury.   HPI  Past Medical History:  Diagnosis Date  . GERD (gastroesophageal reflux disease) 08/08/2017  . Medical history non-contributory     Patient Active Problem List   Diagnosis Date Noted  . Postpartum follow-up 04/29/2020  . Duct ectasia of breast, 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  . Vaginal delivery 03/16/2020  . Indication for care in labor and delivery, antepartum 03/14/2020  . Chest pain, unspecified 11/11/2019  . Obesity affecting pregnancy in third trimester 08/22/2019  . Supervision of low-risk pregnancy, third trimester 07/17/2019  . GERD (gastroesophageal reflux disease) 08/08/2017  . Vitamin D deficiency 02/21/2015  . Learning disability 01/27/2013    Past Surgical History:  Procedure Laterality Date  . NO PAST SURGERIES      OB History    Gravida  4   Para  2   Term  2   Preterm      AB  2   Living  2     SAB  2   IAB      Ectopic      Multiple  0   Live Births  2        Obstetric Comments  Pregnancy #2 Baby Boy, Vit D Deficiency, Fetal Pyelectasis that resolved         Home Medications    Prior to Admission medications   Medication Sig Start Date End Date Taking? Authorizing Provider  Cetirizine HCl 10 MG CAPS Take 1 capsule (10 mg total) by mouth daily for 10 days. 05/04/20 05/14/20 Yes Ladashia Demarinis C, PA-C  fluticasone (FLONASE) 50 MCG/ACT nasal spray Place 1-2 sprays into both nostrils daily. 05/04/20  Yes  Romey Mathieson C, PA-C  ibuprofen (ADVIL) 800 MG tablet Take 1 tablet (800 mg total) by mouth 3 (three) times daily. 05/04/20  Yes Azazel Franze C, PA-C  Norgestimate-Ethinyl Estradiol Triphasic (ORTHO TRI-CYCLEN LO) 0.18/0.215/0.25 MG-25 MCG tab Take 1 tablet by mouth daily. 03/30/20   Doreene Eland, MD    Family History Family History  Problem Relation Age of Onset  . Diabetes Mother   . Hypertension Mother   . Diabetes Maternal Aunt   . Diabetes Maternal Grandmother   . Diabetes Sister   . Mental retardation Sister   . Diabetes Maternal Uncle   . Diabetes Maternal Grandfather     Social History Social History   Tobacco Use  . Smoking status: Never Smoker  . Smokeless tobacco: Never Used  Vaping Use  . Vaping Use: Never used  Substance Use Topics  . Alcohol use: No  . Drug use: No     Allergies   Patient has no known allergies.   Review of Systems Review of Systems  Constitutional: Negative for activity change, appetite change, chills, fatigue and fever.  HENT: Positive for congestion, rhinorrhea and sore throat. Negative for ear pain, sinus pressure and trouble swallowing.   Eyes: Negative for discharge and redness.  Respiratory:  Positive for cough. Negative for chest tightness and shortness of breath.   Cardiovascular: Negative for chest pain.  Gastrointestinal: Negative for abdominal pain, diarrhea, nausea and vomiting.  Musculoskeletal: Negative for myalgias.  Skin: Negative for rash.  Neurological: Negative for dizziness, light-headedness and headaches.     Physical Exam Triage Vital Signs ED Triage Vitals  Enc Vitals Group     BP 05/04/20 1225 112/69     Pulse Rate 05/04/20 1225 72     Resp 05/04/20 1225 18     Temp 05/04/20 1225 98.8 F (37.1 C)     Temp Source 05/04/20 1225 Oral     SpO2 05/04/20 1225 97 %     Weight --      Height --      Head Circumference --      Peak Flow --      Pain Score 05/04/20 1224 0     Pain Loc --      Pain  Edu? --      Excl. in GC? --    No data found.  Updated Vital Signs BP 112/69 (BP Location: Left Arm)   Pulse 72   Temp 98.8 F (37.1 C) (Oral)   Resp 18   SpO2 97%   Breastfeeding No   Visual Acuity Right Eye Distance:   Left Eye Distance:   Bilateral Distance:    Right Eye Near:   Left Eye Near:    Bilateral Near:     Physical Exam Vitals and nursing note reviewed.  Constitutional:      Appearance: She is well-developed.     Comments: No acute distress  HENT:     Head: Normocephalic and atraumatic.     Ears:     Comments: Bilateral ears without tenderness to palpation of external auricle, tragus and mastoid, EAC's without erythema or swelling, TM's with good bony landmarks and cone of light. Non erythematous.     Nose: Nose normal.     Mouth/Throat:     Comments: Oral mucosa pink and moist, no tonsillar enlargement or exudate. Posterior pharynx patent and nonerythematous, no uvula deviation or swelling. Normal phonation. Eyes:     Conjunctiva/sclera: Conjunctivae normal.  Cardiovascular:     Rate and Rhythm: Normal rate.  Pulmonary:     Effort: Pulmonary effort is normal. No respiratory distress.     Comments: Breathing comfortably at rest, CTABL, no wheezing, rales or other adventitious sounds auscultated Abdominal:     General: There is no distension.  Musculoskeletal:        General: Normal range of motion.     Cervical back: Neck supple.  Skin:    General: Skin is warm and dry.  Neurological:     Mental Status: She is alert and oriented to person, place, and time.      UC Treatments / Results  Labs (all labs ordered are listed, but only abnormal results are displayed) Labs Reviewed  CULTURE, GROUP A STREP [facility])  POCT RAPID STREP A (OFFICE)    EKG   Radiology No results found.  Procedures Procedures (including critical care time)  Medications Ordered in UC Medications - No data to display  Initial Impression / Assessment and Plan / UC  Course  I have reviewed the triage vital signs and the nursing notes.  Pertinent labs & imaging results that were available during my care of the patient were reviewed by me and considered in my medical decision making (see chart for details).  1.  URI symptoms/throat irritation-suspect likely viral lower allergy related and recommending symptomatic and supportive care.  Strep test negative.  2.  Knee/back pain-ibuprofen as needed, discussed strengthening leg muscles to take strain off of knee  Discussed strict return precautions. Patient verbalized understanding and is agreeable with plan.  Final Clinical Impressions(s) / UC Diagnoses   Final diagnoses:  Sore throat  Ear itching     Discharge Instructions     Strep test negative Zyrtec and Flonase daily to help with throat irritation/ear itching Ibuprofen and Tylenol as needed for back and knee pain     ED Prescriptions    Medication Sig Dispense Auth. Provider   Cetirizine HCl 10 MG CAPS Take 1 capsule (10 mg total) by mouth daily for 10 days. 10 capsule Verlene Glantz C, PA-C   fluticasone (FLONASE) 50 MCG/ACT nasal spray Place 1-2 sprays into both nostrils daily. 16 g Admir Candelas C, PA-C   ibuprofen (ADVIL) 800 MG tablet Take 1 tablet (800 mg total) by mouth 3 (three) times daily. 21 tablet Toluwani Yadav, Laureles C, PA-C     PDMP not reviewed this encounter.   Lew Dawes, PA-C 05/04/20 1328

## 2020-05-05 ENCOUNTER — Ambulatory Visit: Payer: Medicaid Other

## 2020-05-07 LAB — CULTURE, GROUP A STREP (THRC)

## 2020-05-12 ENCOUNTER — Encounter: Payer: Self-pay | Admitting: Family Medicine

## 2020-05-12 ENCOUNTER — Other Ambulatory Visit: Payer: Self-pay

## 2020-05-12 ENCOUNTER — Ambulatory Visit (INDEPENDENT_AMBULATORY_CARE_PROVIDER_SITE_OTHER): Payer: Medicaid Other | Admitting: Family Medicine

## 2020-05-12 VITALS — BP 104/62 | HR 65 | Ht 68.0 in | Wt 219.6 lb

## 2020-05-12 DIAGNOSIS — N920 Excessive and frequent menstruation with regular cycle: Secondary | ICD-10-CM | POA: Diagnosis present

## 2020-05-12 LAB — POCT URINE PREGNANCY: Preg Test, Ur: NEGATIVE

## 2020-05-12 NOTE — Assessment & Plan Note (Addendum)
Provided reassurance.  Given patient is postpartum as well as new contraception method, menstrual irregularity and spotting is normal.  UPT negative. Anticipatory guidance provided. Patient will return for STD testing.

## 2020-05-12 NOTE — Progress Notes (Signed)
    SUBJECTIVE:   CHIEF COMPLAINT / HPI:   Spotting  Stopped lochia at 5 weeks. Started birth control at 5 weeks. Taking Sprintec. Taking it at the same time everyday.  Patient is concerned because she reports that after her last pregnancy, she had her period very immediately.  She does report having Nexplanon placed at that time.  She is sexually active and does report that the condom broke the other day.  UPT obtained today and is negative.  Patient encouraged to return for STD testing.  PERTINENT  PMH / PSH: Postpartum  OBJECTIVE:   BP 104/62   Pulse 65   Ht 5\' 8"  (1.727 m)   Wt 219 lb 9.6 oz (99.6 kg)   SpO2 98%   BMI 33.39 kg/m   General: Well-appearing female, no acute distress  ASSESSMENT/PLAN:   Spotting Provided reassurance.  Given patient is postpartum as well as new contraception method, menstrual irregularity and spotting is normal.  UPT negative. Anticipatory guidance provided. Patient will return for STD testing.    , MD [facility]

## 2020-05-14 ENCOUNTER — Ambulatory Visit (INDEPENDENT_AMBULATORY_CARE_PROVIDER_SITE_OTHER): Payer: Medicaid Other | Admitting: Family Medicine

## 2020-05-14 ENCOUNTER — Telehealth: Payer: Self-pay | Admitting: Family Medicine

## 2020-05-14 ENCOUNTER — Encounter: Payer: Self-pay | Admitting: Family Medicine

## 2020-05-14 ENCOUNTER — Other Ambulatory Visit: Payer: Self-pay

## 2020-05-14 VITALS — BP 120/80 | HR 64 | Ht 68.0 in | Wt 223.0 lb

## 2020-05-14 DIAGNOSIS — Z309 Encounter for contraceptive management, unspecified: Secondary | ICD-10-CM

## 2020-05-14 DIAGNOSIS — Z202 Contact with and (suspected) exposure to infections with a predominantly sexual mode of transmission: Secondary | ICD-10-CM

## 2020-05-14 DIAGNOSIS — E559 Vitamin D deficiency, unspecified: Secondary | ICD-10-CM | POA: Diagnosis not present

## 2020-05-14 MED ORDER — VITAMIN D (ERGOCALCIFEROL) 1.25 MG (50000 UNIT) PO CAPS: 50000.0000 [IU] | ORAL_CAPSULE | ORAL | 1 refills | Status: DC

## 2020-05-14 NOTE — Progress Notes (Signed)
    SUBJECTIVE:   CHIEF COMPLAINT / HPI:   Exposure to STD  The patient's primary symptoms include genital lesions. The patient's pertinent negatives include no discharge, dysuria or genital itching. Primary symptoms comment: LMP: 05/13/20, medium flow. . Chronicity: 4 days ago, when she was having sex, the condom broke, hence she worries about STD exposure and will like to get tested. The vaginal discharge was normal. Associated symptoms comments: Lower abdominal cramping since she started her period yesterday. She has tried nothing for the symptoms. Risk factors: No hx of STDs, 1 sexual partners in the last 6-12 months. Uses condom regularly.   Vitamin D Def: She mentioned hx of vitamin d deficiency and will like to get retested. She is not on Vit D supplement since her last check.  Contraceptive Management: Asked refill of her OCP.    PERTINENT  PMH / PSH: PMX reviewed  OBJECTIVE:   BP 120/80   Pulse 64   Ht 5\' 8"  (1.727 m)   Wt 223 lb (101.2 kg)   SpO2 98%   BMI 33.91 kg/m   Physical Exam Vitals and nursing note reviewed. Exam conducted with a chaperone present ).  Cardiovascular:     Rate and Rhythm: Normal rate and regular rhythm.     Heart sounds: Normal heart sounds. No murmur heard.   Pulmonary:     Effort: Pulmonary effort is normal. No respiratory distress.     Breath sounds: Normal breath sounds. No wheezing.  Abdominal:     General: Abdomen is flat. Bowel sounds are normal. There is no distension.     Palpations: Abdomen is soft. There is no mass.     Tenderness: There is no abdominal tenderness.  Genitourinary:    Exam position: Lithotomy position.     Cervix: No discharge or cervical bleeding.     Comments: Menstral bleed - Blood washed out during exam to allow cervical swab for STD screen. Musculoskeletal:     Right lower leg: No edema.     Left lower leg: No edema.      ASSESSMENT/PLAN:  STD screening: GC/Chlamydia an Trich  checked. As discussed with her, since her exposure was 4 days ago, it is likely that her HIV and RPR tests will be negative. Both tests were negative about 2-3 months ago. I recommended PCP f/u in 4 weeks for HIV/RPR screen. She agreed with the plan.  Vitamin D deficiency Lab reviewed. Low Vit D level to 9.2 in Dec of 2021 - 5 months ago. Not on supplement. I sent in Vit D 50000 IU to her pharmacy. Advised return for recheck about 4-8 weeks later. She agreed with the plan.   Contraceptive management: Upon med review, she should have refills at her pharmacy. She is advised to contact her pharmacy for medication refill and inform 12-04-1970 if there is an issue with pick up. She agreed with the plan.  [place], MD [facility]

## 2020-05-14 NOTE — Assessment & Plan Note (Addendum)
Lab reviewed. Low Vit D level to 9.2 in Dec of 2021 - 5 months ago. Not on supplement. I sent in Vit D 50000 IU to her pharmacy. Advised return for recheck about 4-8 weeks later. She agreed with the plan.  Note that I initially advised OTC supplement. I will call to inform her about the refills of Vit D 50000 IU instead.

## 2020-05-14 NOTE — Patient Instructions (Signed)
Vitamin D Oral Tablets and Capsules What is this medicine? Vitamin D (VYE ta min D) is a nutrient needed for good health. It helps your body keep the right amount of calcium and phosphorus for healthy bones and teeth. This medicine may be used for other purposes; ask your health care provider or pharmacist if you have questions. COMMON BRAND NAME(S): DECARA, Deltalin, Drisdol, Ergo D, MAXIMUM D3, Thera-D 2000, Thera-D 4000, Thera-D Rapid Repletion, THERA-D SPORT What should I tell my health care provider before I take this medicine? They need to know if you have any of the following conditions:  cystic fibrosis  gallbladder disease  high levels of calcium in the blood  high levels of vitamin D in the blood  inflammatory bowel disease such as Crohn's disease or ulcerative colitis  kidney disease  liver disease  parathyroid disease  other stomach disease  an unusual or allergic reaction to vitamin D, other medicines, foods, dyes, or preservatives  pregnant or trying to get pregnant  breast-feeding How should I use this medicine? Take this medicine by mouth with water. Take it as directed on the label at the same time every day. Take it with a meal or snack; for best results, take it with foods that contain fat (i.e., milk, yogurt, cheese). Do not use it more often than directed. Talk to your health care provider about the use of this medicine in children. Special care may be needed. Overdosage: If you think you have taken too much of this medicine contact a poison control center or emergency room at once. NOTE: This medicine is only for you. Do not share this medicine with others. What if I miss a dose? If you miss a dose, take it as soon as you can. If it is almost time for your next dose, take only that dose. Do not take double or extra doses. What may interact with this medicine?  antacids  diuretics  magnesium supplements  medicines for cholesterol like cholestyramine,  colesevelam, or colestipol  medicines for seizures like phenytoin, fosphenytoin  mineral oil  orlistat  phosphorus supplements  rifampin This list may not describe all possible interactions. Give your health care provider a list of all the medicines, herbs, non-prescription drugs, or dietary supplements you use. Also tell them if you smoke, drink alcohol, or use illegal drugs. Some items may interact with your medicine. What should I watch for while using this medicine? Visit your health care provider for regular checks on your progress. You may need blood work done while you are taking this medicine. Tell your health care provider if your symptoms do not start to get better or if they get worse. Do not take any non-prescription medicines that have vitamin D, phosphorus, magnesium, or calcium including antacids while taking this medicine, unless your health care provider says you can. The extra supplements can cause side effects. What side effects may I notice from receiving this medicine? Side effects that you should report to your doctor or health care professional as soon as possible:  allergic reactions like skin rash, itching or hives, swelling of the face, lips, or tongue  bone pain  confusion  fast, irregular heartbeat  increased thirst  increased urination (especially at night)  muscle weakness Side effects that usually do not require medical attention (Report these to your doctor or health care professional if they continue or are bothersome.):  constipation  fatigue  headache  loss of appetite  nausea This list may not describe all  possible side effects. Call your doctor for medical advice about side effects. You may report side effects to FDA at 1-800-FDA-1088. Where should I keep my medicine? Keep out of the reach of children and pets. Store at room temperature between 15 and 30 degrees C (59 and 86 degrees F). Protect from light. Get rid of any unused  medicine after the expiration date. To get rid of medicines that are no longer needed or have expired:  Take the medicine to a medicine take-back program. Check with your pharmacy or law enforcement to find a location.  If you cannot return the medicine, check the label or package insert to see if the medicine should be thrown out in the garbage or flushed down the toilet. If you are not sure, ask your health care provider. If it is safe to put it in the trash, take the medicine out of the container. Mix the medicine with cat litter, dirt, coffee grounds, or other unwanted substance. Seal the mixture in a bag or container. Put it in the trash. NOTE: This sheet is a summary. It may not cover all possible information. If you have questions about this medicine, talk to your doctor, pharmacist, or health care provider.  2021 Elsevier/Gold Standard (2019-07-24 16:05:52)

## 2020-05-14 NOTE — Telephone Encounter (Signed)
I called and informed her of her Vit D prescriptions.

## 2020-05-14 NOTE — Telephone Encounter (Signed)
Patient is returning a missed call from our office. She had an appointment this morning with Dr. Lum Babe. Patient would like for someone to call her back.

## 2020-05-17 ENCOUNTER — Other Ambulatory Visit (HOSPITAL_COMMUNITY)
Admission: RE | Admit: 2020-05-17 | Discharge: 2020-05-17 | Disposition: A | Discharge status: Home / Self Care | Payer: Medicaid Other | Source: Ambulatory Visit | Attending: Family Medicine | Admitting: Family Medicine

## 2020-05-17 DIAGNOSIS — Z202 Contact with and (suspected) exposure to infections with a predominantly sexual mode of transmission: Secondary | ICD-10-CM | POA: Diagnosis not present

## 2020-05-17 NOTE — Addendum Note (Signed)
Addended by: Janit Pagan T on: 05/17/2020 11:56 AM   Modules accepted: Orders

## 2020-05-18 ENCOUNTER — Telehealth: Payer: Self-pay | Admitting: Family Medicine

## 2020-05-18 LAB — CERVICOVAGINAL ANCILLARY ONLY
Chlamydia: NEGATIVE
Comment: NEGATIVE
Comment: NEGATIVE
Comment: NORMAL
Neisseria Gonorrhea: NEGATIVE
Trichomonas: NEGATIVE

## 2020-05-18 NOTE — Telephone Encounter (Signed)
HIPAA compliant callback message left.  Should she call back, please advise:  Gonorrhea and Chlamydia tests are negative. F/U with PCP soon for HIV and Syphilis test.  Should she develop vaginal discharge, encourage f/u for repeat STD screening and wet prep.

## 2020-05-25 ENCOUNTER — Other Ambulatory Visit: Payer: Self-pay | Admitting: Family Medicine

## 2020-06-01 ENCOUNTER — Other Ambulatory Visit (HOSPITAL_COMMUNITY)
Admission: RE | Admit: 2020-06-01 | Discharge: 2020-06-01 | Disposition: A | Discharge status: Home / Self Care | Payer: Medicaid Other | Source: Ambulatory Visit | Attending: Family Medicine | Admitting: Family Medicine

## 2020-06-01 ENCOUNTER — Other Ambulatory Visit: Payer: Self-pay

## 2020-06-01 ENCOUNTER — Encounter: Payer: Self-pay | Admitting: Family Medicine

## 2020-06-01 ENCOUNTER — Ambulatory Visit (INDEPENDENT_AMBULATORY_CARE_PROVIDER_SITE_OTHER): Payer: Medicaid Other | Admitting: Family Medicine

## 2020-06-01 VITALS — BP 110/68 | HR 62

## 2020-06-01 DIAGNOSIS — N898 Other specified noninflammatory disorders of vagina: Secondary | ICD-10-CM | POA: Diagnosis not present

## 2020-06-01 DIAGNOSIS — Z113 Encounter for screening for infections with a predominantly sexual mode of transmission: Secondary | ICD-10-CM | POA: Insufficient documentation

## 2020-06-01 LAB — POCT WET PREP (WET MOUNT)
Clue Cells Wet Prep Whiff POC: NEGATIVE
Trichomonas Wet Prep HPF POC: ABSENT

## 2020-06-01 MED ORDER — FLUCONAZOLE 150 MG PO TABS
150.0000 mg | ORAL_TABLET | Freq: Once | ORAL | 0 refills | Status: AC
Start: 2020-06-01 — End: 2020-06-01

## 2020-06-01 NOTE — Addendum Note (Signed)
Addended by: Joana Reamer on: 06/01/2020 02:54 PM   Modules accepted: Orders

## 2020-06-01 NOTE — Assessment & Plan Note (Signed)
Acute. No genital or vaginal irritation appreciated on exam. Some scant vaginal discharge.  Wet prep negative. GC/CL and HIV/RPR pending. Will follow up remaining results.

## 2020-06-01 NOTE — Progress Notes (Signed)
   Subjective:   Patient ID: Kristen Dean    DOB: 2000/06/17, 20 y.o. female   MRN: [id number]  Kristen Dean is a 20 y.o. female with a history of recent pregnancy within 2 months, GERD, duct ectasia of left breast, learning disability, obesity, vitamin d deficiency  here for vaginal irritation  Vaginal Irritation: Patient notes that she had white discharge, irritation and itching in vaginal area x 5 days ago. She completed a 3 day course of monistat. She continues to have feeling of irritation in the area which is what brings her in today. She denies any pelvic pain or bleeding. Denies any dysuria, urgency, hematuria.  LMP 05/13/20. She notes she just started birth control. She is sexually active with one female partner. Denies h/o STDs or known STD exposures. Does have history of BV in past. She desires STD testing.  Review of Systems:  Per HPI.   Objective:   BP 110/68   Pulse 62   LMP 05/13/2020 (Exact Date)   SpO2 98%   Breastfeeding No  Vitals and nursing note reviewed.  General: pleasant young female, sitting comfortably in exam chair, well nourished, well developed, in no acute distress with non-toxic appearance Resp: breathing comfortably on room air, speaking in full sentences Skin: warm, dry Extremities: warm and well perfused, normal tone MSK: gait normal Neuro: Alert and oriented, speech normal Pelvic exam: VULVA: normal appearing vulva with no masses, tenderness or lesions, VAGINA: normal appearing vagina with normal color and discharge, no lesions, CERVIX: normal appearing cervix without discharge or lesions, exam chaperoned by Jazmin H.  Assessment & Plan:   Vaginal irritation Acute. No genital or vaginal irritation appreciated on exam. Some scant vaginal discharge.  Wet prep negative. GC/CL and HIV/RPR pending. Will follow up remaining results.   Orders Placed This Encounter  Procedures  . HIV antibody (with reflex)  . RPR  . POCT Wet Prep [facility])    No orders of the defined types were placed in this encounter.     Orpah Cobb, DO PGY-3, [facility] 06/01/2020 2:46 PM

## 2020-06-02 LAB — HIV ANTIBODY (ROUTINE TESTING W REFLEX): HIV Screen 4th Generation wRfx: NONREACTIVE

## 2020-06-02 LAB — CERVICOVAGINAL ANCILLARY ONLY
Chlamydia: NEGATIVE
Comment: NEGATIVE
Comment: NORMAL
Neisseria Gonorrhea: NEGATIVE

## 2020-06-02 LAB — RPR: RPR Ser Ql: NONREACTIVE

## 2020-06-16 ENCOUNTER — Encounter: Payer: Self-pay | Admitting: Family Medicine

## 2020-06-16 ENCOUNTER — Ambulatory Visit (INDEPENDENT_AMBULATORY_CARE_PROVIDER_SITE_OTHER): Payer: Medicaid Other | Admitting: Family Medicine

## 2020-06-16 ENCOUNTER — Other Ambulatory Visit: Payer: Self-pay

## 2020-06-16 VITALS — BP 116/66 | HR 83 | Wt 222.4 lb

## 2020-06-16 DIAGNOSIS — R82998 Other abnormal findings in urine: Secondary | ICD-10-CM | POA: Diagnosis not present

## 2020-06-16 DIAGNOSIS — G44209 Tension-type headache, unspecified, not intractable: Secondary | ICD-10-CM | POA: Diagnosis not present

## 2020-06-16 HISTORY — DX: Tension-type headache, unspecified, not intractable: G44.209

## 2020-06-16 LAB — POCT URINALYSIS DIP (MANUAL ENTRY)
Bilirubin, UA: NEGATIVE
Blood, UA: NEGATIVE
Glucose, UA: NEGATIVE mg/dL
Ketones, POC UA: NEGATIVE mg/dL
Leukocytes, UA: NEGATIVE
Nitrite, UA: NEGATIVE
Protein Ur, POC: NEGATIVE mg/dL
Spec Grav, UA: 1.025 (ref 1.010–1.025)
Urobilinogen, UA: 0.2 E.U./dL
pH, UA: 6 (ref 5.0–8.0)

## 2020-06-16 NOTE — Progress Notes (Signed)
   Subjective:   Patient ID: Kristen Dean    DOB: 11/04/2000, 20 y.o. female   MRN: [id number]  Kristen Dean is a 20 y.o. female with a history of recent delivery (3 months ago), GERD, learning disbility, vitamin D deficiency, chest pain, left duct ectasia of breast here for headache and abdominal pain.  HPI:  Headache and abdominal pain x 2 days. Noticed dark urine x 1 day. Denies dysuria. Denies fevers, nausea, vomiting, diarrhea, cough, congestion, runny nose. Does endorse drinking water - 3-4 16oz water bottles. Notes the headache is bilateral across the front - intermittent. Nothing makes it worse or better. Goes away on its own. Notes occasional blurry vision and "seeing stars". Abdominal pain that feels like a "cramp", constant. Eating and drinking okay. Notes last bowel movement yesterday that was "normal". Does note that her infant at home has developed some diarrhea for a few days.   Review of Systems:  Per HPI.   Objective:   BP 116/66   Pulse 83   Wt 222 lb 6.4 oz (100.9 kg)   SpO2 97%   BMI 33.82 kg/m  Vitals and nursing note reviewed.  General: pleasant young female, sitting comfortably in exam chair, well nourished, well developed, in no acute distress with non-toxic appearance HEENT: normocephalic, atraumatic, moist mucous membranes, oropharynx clear without erythema or exudate, TM normal bilaterally,  Neck: supple, non-tender without lymphadenopathy, normal ROM CV: regular rate and rhythm without murmurs, rubs, or gallops, no lower extremity edema, 2+ radial and pedal pulses bilaterally Lungs: clear to auscultation bilaterally with normal work of breathing on room air, speaking in full sentences Abdomen: soft, non-tender, non-distended, no masses or organomegaly palpable, normoactive bowel sounds Skin: warm, dry Extremities: warm and well perfused, normal tone Neuro: Alert and oriented, speech normal, strength intact UE and LE, PERRL, EOMI, gait  normal  Assessment & Plan:   Tension headache Headache and abdominal pain x 2 days. Headache appears most consistent with tension headache. Possible mild dehydration given endorsement of dark urine. No other infectious symptoms at this time. Does endorse that her son has been having diarrhea thus could be early onset viral illness. Overall, patient appears very well on exam and hemodynamically stable with normal vitals. Does not appear significantly dehydrated and neuro exam normal. Tolerating PO. Benign abdomen. Reocmmended good hydration and Tylenol/ibuprofen PRN for headache. If worsening she should follow up for reevaluation and labs. She voiced understanding and agreement with plan.   Orders Placed This Encounter  Procedures   POCT urinalysis dipstick   No orders of the defined types were placed in this encounter.     Orpah Cobb, DO PGY-3, [facility] 06/16/2020 5:55 PM

## 2020-06-16 NOTE — Assessment & Plan Note (Addendum)
Headache and abdominal pain x 2 days. Headache appears most consistent with tension headache. Possible mild dehydration given endorsement of dark urine. No other infectious symptoms at this time. Does endorse that her son has been having diarrhea thus could be early onset viral illness. Overall, patient appears very well on exam and hemodynamically stable with normal vitals. Does not appear significantly dehydrated and neuro exam normal. Tolerating PO. Benign abdomen. Reocmmended good hydration and Tylenol/ibuprofen PRN for headache. If worsening she should follow up for reevaluation and labs. She voiced understanding and agreement with plan.

## 2020-06-16 NOTE — Patient Instructions (Signed)
Please be sure to stay well hydrated. I did not find anything abnormal on your exam today. I suspect a tension headache - you can take tylenol or ibuprofen as needed.   Follow up if your symptoms worsen or fail to improve or if you are unable to stay well hydrated.   I hope you feel better soon.

## 2020-06-23 ENCOUNTER — Ambulatory Visit: Payer: Self-pay

## 2020-06-24 ENCOUNTER — Encounter (HOSPITAL_COMMUNITY): Payer: Self-pay

## 2020-06-24 ENCOUNTER — Ambulatory Visit (HOSPITAL_COMMUNITY)
Admission: EM | Admit: 2020-06-24 | Discharge: 2020-06-24 | Disposition: A | Discharge status: Home / Self Care | Payer: Medicaid Other | Attending: Family Medicine | Admitting: Family Medicine

## 2020-06-24 ENCOUNTER — Inpatient Hospital Stay (HOSPITAL_COMMUNITY)
Admission: RE | Admit: 2020-06-24 | Discharge: 2020-06-24 | Disposition: A | Discharge status: Home / Self Care | Payer: Self-pay | Source: Ambulatory Visit

## 2020-06-24 ENCOUNTER — Other Ambulatory Visit: Payer: Self-pay

## 2020-06-24 DIAGNOSIS — B3731 Acute candidiasis of vulva and vagina: Secondary | ICD-10-CM

## 2020-06-24 DIAGNOSIS — N390 Urinary tract infection, site not specified: Secondary | ICD-10-CM | POA: Diagnosis not present

## 2020-06-24 DIAGNOSIS — B373 Candidiasis of vulva and vagina: Secondary | ICD-10-CM

## 2020-06-24 LAB — POCT URINALYSIS DIPSTICK, ED / UC
Bilirubin Urine: NEGATIVE
Glucose, UA: NEGATIVE mg/dL
Hgb urine dipstick: NEGATIVE
Ketones, ur: NEGATIVE mg/dL
Nitrite: NEGATIVE
Protein, ur: NEGATIVE mg/dL
Specific Gravity, Urine: 1.02 (ref 1.005–1.030)
Urobilinogen, UA: 0.2 mg/dL (ref 0.0–1.0)
pH: 6.5 (ref 5.0–8.0)

## 2020-06-24 LAB — POC URINE PREG, ED: Preg Test, Ur: NEGATIVE

## 2020-06-24 MED ORDER — FLUCONAZOLE 150 MG PO TABS: 150.0000 mg | ORAL_TABLET | Freq: Every day | ORAL | 0 refills | Status: AC

## 2020-06-24 MED ORDER — NITROFURANTOIN MONOHYD MACRO 100 MG PO CAPS: 100.0000 mg | ORAL_CAPSULE | Freq: Two times a day (BID) | ORAL | 0 refills | Status: AC

## 2020-06-24 NOTE — Discharge Instructions (Addendum)
Please use backup birth control method while on antibiotic.

## 2020-06-24 NOTE — ED Provider Notes (Addendum)
[facility]    CSN: [id number] Arrival date & time: 06/24/20  1749      History   Chief Complaint Chief Complaint  Patient presents with   Appointment    1800   Abdominal Pain   Headache    HPI Kristen Dean is a 20 y.o. female.   Patient states that they are having white vaginal discharge with vaginal itching and irritation for 2 days. Denies urinary burning or frequency but states that she has some occasional vaginal burning. Denies fever or lower back pain. Endorses some lower abdominal pain at times. Was seen at PCP on 06/01/20 for similar white vaginal discharge. Was treated with fluconazole with complete resolution of symptoms. Last menstrual cycle was on 05/10/20 and patient is currently on birth control. Denies having multiple sexual partners and denies known exposure to STD. Denies current breast feeding. Having occasional headaches but denies dizziness, nausea, vomiting, blurred vision.    Abdominal Pain Headache  Past Medical History:  Diagnosis Date   GERD (gastroesophageal reflux disease) 08/08/2017   Medical history non-contributory    Tension headache 06/16/2020    Patient Active Problem List   Diagnosis Date Noted   Tension headache 06/16/2020   Duct ectasia of breast, 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   Chest pain, unspecified 11/11/2019   GERD (gastroesophageal reflux disease) 08/08/2017   Vitamin D deficiency 02/21/2015   Learning disability 01/27/2013    Past Surgical History:  Procedure Laterality Date   NO PAST SURGERIES      OB History     Gravida  4   Para  2   Term  2   Preterm      AB  2   Living  2      SAB  2   IAB      Ectopic      Multiple  0   Live Births  2        Obstetric Comments  Pregnancy #2 Baby Boy, Vit D Deficiency, Fetal Pyelectasis that resolved          Home Medications    Prior to Admission medications   Medication Sig Start Date End Date Taking? Authorizing Provider  fluconazole (DIFLUCAN)  150 MG tablet Take 1 tablet (150 mg total) by mouth daily for 1 day. 06/24/20 06/25/20 Yes Lance Muss, FNP  nitrofurantoin, macrocrystal-monohydrate, (MACROBID) 100 MG capsule Take 1 capsule (100 mg total) by mouth 2 (two) times daily for 7 days. 06/24/20 07/01/20 Yes Lance Muss, FNP  Cetirizine HCl 10 MG CAPS Take 1 capsule (10 mg total) by mouth daily for 10 days. Patient not taking: No sig reported 05/04/20 05/14/20  Wieters, Hallie C, PA-C  fluticasone (FLONASE) 50 MCG/ACT nasal spray Place 1-2 sprays into both nostrils daily. Patient not taking: No sig reported 05/04/20   Wieters, Hallie C, PA-C  ibuprofen (ADVIL) 800 MG tablet Take 1 tablet (800 mg total) by mouth 3 (three) times daily. Patient not taking: No sig reported 05/04/20   Wieters, [employer] C, PA-C  Norgestimate-Ethinyl Estradiol Triphasic (ORTHO TRI-CYCLEN LO) 0.18/0.215/0.25 MG-25 MCG tab Take 1 tablet by mouth daily. 03/30/20   Doreene Eland, MD  Vitamin D, Ergocalciferol, (DRISDOL) 1.25 MG (50000 UNIT) CAPS capsule Take 1 capsule (50,000 Units total) by mouth every 7 (seven) days. 05/14/20   Doreene Eland, MD    Family History Family History  Problem Relation Age of Onset   Diabetes Mother    Hypertension Mother    Diabetes  Maternal Aunt    Diabetes Maternal Grandmother    Diabetes Sister    Mental retardation Sister    Diabetes Maternal Uncle    Diabetes Maternal Grandfather     Social History Social History   Tobacco Use   Smoking status: Never   Smokeless tobacco: Never  Vaping Use   Vaping Use: Never used  Substance Use Topics   Alcohol use: No   Drug use: No     Allergies   Patient has no known allergies.   Review of Systems Review of Systems Per HPI  Physical Exam Triage Vital Signs ED Triage Vitals  Enc Vitals Group     BP 06/24/20 1810 118/69     Pulse Rate 06/24/20 1810 86     Resp 06/24/20 1810 18     Temp 06/24/20 1810 98.1 F (36.7 C)     Temp Source 06/24/20 1810 Oral      SpO2 06/24/20 1810 97 %     Weight --      Height --      Head Circumference --      Peak Flow --      Pain Score 06/24/20 1807 5     Pain Loc --      Pain Edu? --      Excl. in GC? --    No data found.  Updated Vital Signs BP 118/69 (BP Location: Left Arm)   Pulse 86   Temp 98.1 F (36.7 C) (Oral)   Resp 18   SpO2 97%   Breastfeeding No   Visual Acuity Right Eye Distance:   Left Eye Distance:   Bilateral Distance:    Right Eye Near:   Left Eye Near:    Bilateral Near:     Physical Exam Vitals and nursing note reviewed.  Constitutional:      General: She is not in acute distress.    Appearance: She is well-developed.  HENT:     Head: Normocephalic and atraumatic.  Eyes:     Conjunctiva/sclera: Conjunctivae normal.  Cardiovascular:     Rate and Rhythm: Normal rate and regular rhythm.     Heart sounds: No murmur heard. Pulmonary:     Effort: Pulmonary effort is normal. No respiratory distress.     Breath sounds: Normal breath sounds.  Abdominal:     Palpations: Abdomen is soft.     Tenderness: There is no abdominal tenderness.  Genitourinary:    Comments: Pelvic exam deferred, self swab completed Musculoskeletal:     Cervical back: Neck supple.  Skin:    General: Skin is warm and dry.  Neurological:     Mental Status: She is alert.     UC Treatments / Results  Labs (all labs ordered are listed, but only abnormal results are displayed) Labs Reviewed  POCT URINALYSIS DIPSTICK, ED / UC - Abnormal; Notable for the following components:      Result Value   Leukocytes,Ua SMALL (*)    All other components within normal limits  POC URINE PREG, ED  CERVICOVAGINAL ANCILLARY ONLY    EKG   Radiology No results found.  Procedures Procedures (including critical care time)  Medications Ordered in UC Medications - No data to display  Initial Impression / Assessment and Plan / UC Course  I have reviewed the triage vital signs and the nursing  notes.  Pertinent labs & imaging results that were available during my care of the patient were reviewed by me and considered in  my medical decision making (see chart for details).  Clinical Course as of 06/24/20 1908  Thu Jun 24, 2020  1855 Glori Luis): SMALL [HF]    Clinical Course User Index [HF] Lance Muss, FNP  Possible vaginal candidiasis in addition to urinary tract infection due to small amount of leukocytes found on urinalysis. Urine culture and vaginal swab pending. Will follow up with patient if any results warrant a treatment plan change. Patient was advised to follow up with PCP if symptoms do not improve or resolve.  Final Clinical Impressions(s) / UC Diagnoses   Final diagnoses:  Lower urinary tract infectious disease  Candidiasis of vagina     Discharge Instructions      Please use backup birth control method while on antibiotic.      ED Prescriptions     Medication Sig Dispense Auth. Provider   nitrofurantoin, macrocrystal-monohydrate, (MACROBID) 100 MG capsule Take 1 capsule (100 mg total) by mouth 2 (two) times daily for 7 days. 14 capsule Lance Muss, FNP   fluconazole (DIFLUCAN) 150 MG tablet Take 1 tablet (150 mg total) by mouth daily for 1 day. 1 tablet Lance Muss, FNP      PDMP not reviewed this encounter.   Lance Muss, FNP 06/24/20 1903    Lance Muss, FNP 06/24/20 1914

## 2020-06-24 NOTE — ED Triage Notes (Signed)
Pt reports vaginal irritation, white vaginal discharge x 2 days; headache and abdominal pain x 5 days.

## 2020-06-25 LAB — CERVICOVAGINAL ANCILLARY ONLY
Bacterial Vaginitis (gardnerella): NEGATIVE
Candida Glabrata: NEGATIVE
Candida Vaginitis: POSITIVE — AB
Chlamydia: NEGATIVE
Comment: NEGATIVE
Comment: NEGATIVE
Comment: NEGATIVE
Comment: NEGATIVE
Comment: NEGATIVE
Comment: NORMAL
Neisseria Gonorrhea: NEGATIVE
Trichomonas: NEGATIVE

## 2020-06-28 ENCOUNTER — Ambulatory Visit: Payer: Medicaid Other | Admitting: Family Medicine

## 2020-07-06 ENCOUNTER — Other Ambulatory Visit: Payer: Self-pay | Admitting: Family Medicine

## 2020-07-21 ENCOUNTER — Other Ambulatory Visit: Payer: Self-pay

## 2020-07-21 ENCOUNTER — Ambulatory Visit
Admission: RE | Admit: 2020-07-21 | Discharge: 2020-07-21 | Disposition: A | Discharge status: Home / Self Care | Payer: Medicaid Other | Source: Ambulatory Visit | Attending: Emergency Medicine | Admitting: Emergency Medicine

## 2020-07-21 VITALS — BP 108/67 | HR 68 | Temp 98.6°F | Resp 18

## 2020-07-21 DIAGNOSIS — R1084 Generalized abdominal pain: Secondary | ICD-10-CM | POA: Insufficient documentation

## 2020-07-21 LAB — POCT URINALYSIS DIP (MANUAL ENTRY)
Bilirubin, UA: NEGATIVE
Blood, UA: NEGATIVE
Glucose, UA: NEGATIVE mg/dL
Ketones, POC UA: NEGATIVE mg/dL
Leukocytes, UA: NEGATIVE
Nitrite, UA: NEGATIVE
Protein Ur, POC: NEGATIVE mg/dL
Spec Grav, UA: 1.025 (ref 1.010–1.025)
Urobilinogen, UA: 0.2 E.U./dL
pH, UA: 7 (ref 5.0–8.0)

## 2020-07-21 LAB — POCT URINE PREGNANCY: Preg Test, Ur: NEGATIVE

## 2020-07-21 MED ORDER — FAMOTIDINE 20 MG PO TABS: 20.0000 mg | ORAL_TABLET | Freq: Two times a day (BID) | ORAL | 0 refills | Status: DC

## 2020-07-21 MED ORDER — OMEPRAZOLE 20 MG PO CPDR: 20.0000 mg | DELAYED_RELEASE_CAPSULE | Freq: Two times a day (BID) | ORAL | 0 refills | Status: DC

## 2020-07-21 NOTE — Discharge Instructions (Addendum)
Urine normal, vaginal swab and blood work pending for further screening Begin omeprazole/Prilosec twice daily x2 weeks Supplement with Pepcid twice daily Avoid trigger foods-see attached Follow-up if not improving

## 2020-07-21 NOTE — ED Triage Notes (Signed)
Pt is present today with upper abdominal pain that started last month. Pt states that she was given a edible without knowledge around fathers day and thinks it may be side effects from the edible.

## 2020-07-21 NOTE — ED Provider Notes (Signed)
[facility]    CSN: [id number] Arrival date & time: 07/21/20  1223      History   Chief Complaint Chief Complaint  Patient presents with   Abdominal Pain    Upper     HPI Kristen Dean is a 20 y.o. female history of GERD presenting today for evaluation of abdominal pain.  Reports pain for a few weeks which has been intermittent.  Over the past week pain worse than normal.  Describes a sharp pain.  Mild associated nausea.  Bowels at baseline, reports daily bowel movements.  Tolerating oral intake, but occasionally flaring discomfort.  Tried ibuprofen without improvement.  Requesting STD screening.  Reports symptoms did begin after eating an edible, declines regular marijuana use.  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 Authorizing Provider  omeprazole (PRILOSEC) 20 MG capsule Take 1 capsule (20 mg total) by mouth 2 (two) times daily before a meal for 15 days. 07/21/20 08/05/20 Yes Wadie Mattie C, PA-C  famotidine (PEPCID) 20 MG tablet Take 1 tablet (20 mg total) by mouth 2 (two) times daily. 07/21/20    Shanya Ferriss C, PA-C  TRI-LO-SPRINTEC 0.18/0.215/0.25 MG-25 MCG tab TAKE 1 TABLET BY MOUTH DAILY 07/06/20   Simmons-Robinson, Makiera, MD  Vitamin D, Ergocalciferol, (DRISDOL) 1.25 MG (50000 UNIT) CAPS capsule TAKE 1 CAPSULE BY MOUTH EVERY 7 DAYS 07/06/20   Simmons-Robinson, Tawanna Cooler, MD    Family History Family History  Problem Relation Age of Onset   Diabetes Mother    Hypertension Mother    Diabetes Maternal Aunt    Diabetes Maternal Grandmother    Diabetes Sister    Mental retardation Sister    Diabetes Maternal Uncle    Diabetes Maternal Grandfather     Social History Social History   Tobacco Use   Smoking status: Never   Smokeless tobacco: Never  Vaping Use   Vaping Use: Never used  Substance Use Topics   Alcohol use: No   Drug use: No     Allergies   Patient has no known allergies.   Review of Systems Review of Systems  Constitutional:  Negative for fever.  Respiratory:  Negative for shortness of breath.   Cardiovascular:  Negative for chest pain.  Gastrointestinal:  Positive for abdominal pain and nausea. Negative for diarrhea and vomiting.  Genitourinary:  Negative for dysuria, flank pain, genital sores, hematuria, menstrual problem, vaginal bleeding, vaginal discharge and vaginal pain.  Musculoskeletal:  Negative for back pain.  Skin:  Negative for rash.  Neurological:  Negative for dizziness, light-headedness and headaches.    Physical Exam Triage Vital Signs ED Triage Vitals  Enc Vitals Group     BP 07/21/20 1239 108/67     Pulse Rate 07/21/20 1239 68     Resp 07/21/20 1239 18     Temp 07/21/20 1239 98.6 F (37 C)     Temp Source 07/21/20 1239 Oral     SpO2 07/21/20 1239 95 %     Weight --      Height --      Head Circumference --      Peak Flow --      Pain Score 07/21/20 1238 8     Pain Loc --      Pain Edu? --      Excl. in GC? --    No data found.  Updated Vital Signs BP 108/67   Pulse 68   Temp 98.6 F (37 C) (Oral)   Resp 18    SpO2 95%   Visual Acuity Right Eye Distance:   Left Eye Distance:   Bilateral Distance:    Right Eye Near:   Left Eye Near:    Bilateral Near:     Physical Exam Vitals and nursing note reviewed.  Constitutional:      Appearance: She is well-developed.     Comments: No acute distress  HENT:     Head: Normocephalic and atraumatic.     Nose: Nose normal.  Eyes:     Conjunctiva/sclera: Conjunctivae normal.  Cardiovascular:     Rate and Rhythm: Normal rate and regular rhythm.  Pulmonary:     Effort: Pulmonary effort is normal. No respiratory distress.     Comments: Breathing comfortably at rest, CTABL, no wheezing, rales or other adventitious sounds auscultated  Abdominal:     General: There is no distension.     Comments: Soft, nondistended, tender to palpation in bilateral lower quadrants as well as left upper quadrant and epigastrium, negative rebound, negative Rovsing, negative McBurney's, negative Murphy's  Musculoskeletal:        General: Normal range of motion.     Cervical back: Neck supple.  Skin:    General: Skin is warm and dry.  Neurological:     Mental Status: She is alert and oriented to person, place, and time.     UC Treatments / Results  Labs (all labs ordered are listed, but only abnormal results are displayed) Labs Reviewed  HIV ANTIBODY (ROUTINE TESTING W REFLEX)  RPR  POCT URINALYSIS DIP (MANUAL ENTRY)  POCT URINE PREGNANCY  CERVICOVAGINAL ANCILLARY ONLY    EKG   Radiology No results found.  Procedures Procedures (including critical care time)  Medications Ordered in UC Medications - No data to display  Initial Impression / Assessment and Plan / UC Course  I have reviewed the triage vital signs and the nursing notes.  Pertinent labs & imaging results that were available during my care of the patient were reviewed by me and considered in my medical decision making (see chart for details).     Abdominal pain-pregnancy test  negative, UA unremarkable, vaginal swab pending to screen for STDs per request including HIV and syphilis with blood work.  Suspect most likely gastritis if symptoms began after eating edible as well as reported symptoms worsening  after eating.  Initiating on omeprazole, Pepcid to supplement and avoid trigger foods.  Continue to monitor,Discussed strict return precautions. Patient verbalized understanding and is agreeable with plan.  Final Clinical Impressions(s) / UC Diagnoses   Final diagnoses:  Generalized abdominal pain     Discharge Instructions      Urine normal, vaginal swab and blood work pending for further screening Begin omeprazole/Prilosec twice daily x2 weeks Supplement with Pepcid twice daily Avoid trigger foods-see attached Follow-up if not improving     ED Prescriptions     Medication Sig Dispense Auth. Provider   omeprazole (PRILOSEC) 20 MG capsule Take 1 capsule (20 mg total) by mouth 2 (two) times daily before a meal for 15 days. 30 capsule Thamara Leger C, PA-C   famotidine (PEPCID) 20 MG tablet  (Status: Discontinued) Take 1 tablet (20 mg total) by mouth 2 (two) times daily. 30 tablet Meesha Sek C, PA-C   famotidine (PEPCID) 20 MG tablet  (Status: Discontinued) Take 1 tablet (20 mg total) by mouth 2 (two) times daily. 30 tablet Laquinn Shippy C, PA-C   famotidine (PEPCID) 20 MG tablet Take 1 tablet (20 mg total) by mouth 2 (two) times daily. 30 tablet Charmian Forbis, Leola C, PA-C      PDMP not reviewed this encounter.   Lew Dawes, PA-C 07/21/20 1351

## 2020-07-22 LAB — CERVICOVAGINAL ANCILLARY ONLY
Bacterial Vaginitis (gardnerella): NEGATIVE
Candida Glabrata: NEGATIVE
Candida Vaginitis: NEGATIVE
Chlamydia: NEGATIVE
Comment: NEGATIVE
Comment: NEGATIVE
Comment: NEGATIVE
Comment: NEGATIVE
Comment: NEGATIVE
Comment: NORMAL
Neisseria Gonorrhea: NEGATIVE
Trichomonas: NEGATIVE

## 2020-07-22 LAB — RPR: RPR Ser Ql: NONREACTIVE

## 2020-07-22 LAB — HIV ANTIBODY (ROUTINE TESTING W REFLEX): HIV Screen 4th Generation wRfx: NONREACTIVE

## 2020-07-27 ENCOUNTER — Ambulatory Visit
Admission: RE | Admit: 2020-07-27 | Discharge: 2020-07-27 | Disposition: A | Discharge status: Home / Self Care | Payer: Medicaid Other | Source: Ambulatory Visit

## 2020-07-27 ENCOUNTER — Encounter: Payer: Self-pay | Admitting: Gastroenterology

## 2020-07-27 ENCOUNTER — Encounter: Payer: Self-pay | Admitting: Emergency Medicine

## 2020-07-27 ENCOUNTER — Ambulatory Visit
Admission: EM | Admit: 2020-07-27 | Discharge: 2020-07-27 | Disposition: A | Discharge status: Home / Self Care | Payer: Medicaid Other | Attending: Emergency Medicine | Admitting: Emergency Medicine

## 2020-07-27 ENCOUNTER — Other Ambulatory Visit: Payer: Self-pay

## 2020-07-27 DIAGNOSIS — R1013 Epigastric pain: Secondary | ICD-10-CM | POA: Diagnosis not present

## 2020-07-27 DIAGNOSIS — R42 Dizziness and giddiness: Secondary | ICD-10-CM | POA: Diagnosis not present

## 2020-07-27 DIAGNOSIS — R519 Headache, unspecified: Secondary | ICD-10-CM

## 2020-07-27 MED ORDER — LIDOCAINE VISCOUS HCL 2 % MT SOLN
15.0000 mL | Freq: Once | OROMUCOSAL | Status: AC
  Administered 2020-07-27: 15 mL via ORAL

## 2020-07-27 MED ORDER — MECLIZINE HCL 12.5 MG PO TABS: 25.0000 mg | ORAL_TABLET | Freq: Three times a day (TID) | ORAL | 0 refills | Status: DC | PRN

## 2020-07-27 MED ORDER — SUCRALFATE 1 G PO TABS: 1.0000 g | ORAL_TABLET | Freq: Three times a day (TID) | ORAL | 0 refills | Status: DC

## 2020-07-27 MED ORDER — KETOROLAC TROMETHAMINE 30 MG/ML IJ SOLN
30.0000 mg | Freq: Once | INTRAMUSCULAR | Status: AC
  Administered 2020-07-27: 30 mg via INTRAMUSCULAR

## 2020-07-27 MED ORDER — ONDANSETRON 4 MG PO TBDP: 4.0000 mg | ORAL_TABLET | Freq: Three times a day (TID) | ORAL | 0 refills | Status: DC | PRN

## 2020-07-27 MED ORDER — ALUM & MAG HYDROXIDE-SIMETH 200-200-20 MG/5ML PO SUSP
30.0000 mL | Freq: Once | ORAL | Status: AC
  Administered 2020-07-27: 30 mL via ORAL

## 2020-07-27 NOTE — Discharge Instructions (Addendum)
We gave you a shot of Toradol for your headache We gave you Maalox and lidocaine in the GI cocktail Please continue omeprazole/Prilosec for full 2 weeks; continue Pepcid as needed Begin Carafate before meals and bedtime Zofran dissolved in mouth as needed for nausea May trial meclizine as needed for dizziness/nausea-may cause drowsiness, do not drive or work after taking Referral placed to gastroenterology Please follow-up in emergency room if symptoms continuing to progress/worsen

## 2020-07-27 NOTE — ED Provider Notes (Signed)
[facility]    CSN: [id number] Arrival date & time: 07/27/20  1250      History   Chief Complaint Chief Complaint  Patient presents with   Abdominal Pain   Headache    HPI Kristen Dean is a 20 y.o. female history of GERD, tension headaches presenting today for evaluation of headache abdominal pain and vomiting.  Patient has had worsening abdominal pain over the past month.  Was seen here approximately 1 week ago and treated for GERD and has been taking omeprazole and Pepcid without full relief of symptoms.  She reports worsening upper abdominal pain with a burning sensation as well as nausea and vomiting which has become more persistent.  She reports last menstrual cycle was last week.  She is on oral contraceptives.  She occasionally will have a sour taste in mouth.  Reports associated headaches, eye pain and slight dizziness associated with this and fatigue.  The symptoms became more prominent after starting medicines provided at last visit.  Denies history of GI problems.  Reports bowels at baseline.  Denies urinary symptoms.  Recent STD screening negative.  Declines alcohol use, tobacco use.  Previously was using Tylenol/ibuprofen and since last visit has been limiting use of this.  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 Authorizing Provider  meclizine (ANTIVERT) 12.5 MG tablet Take 2 tablets (25 mg total) by mouth 3 (three) times daily as needed for dizziness. 07/27/20  Yes Amaree Loisel C, PA-C  ondansetron (ZOFRAN ODT) 4 MG disintegrating tablet Take 1 tablet (4 mg total) by mouth every 8 (eight) hours as needed for nausea or vomiting. 07/27/20  Yes Paula Busenbark C, PA-C  sucralfate (CARAFATE) 1 g tablet Take 1 tablet (1 g total) by mouth 4 (four) times daily -  with meals and at bedtime. 07/27/20  Yes Oluwademilade Kellett C, PA-C  famotidine (PEPCID) 20 MG tablet Take 1 tablet (20 mg total) by mouth 2 (two) times daily. 07/21/20   Katasha Riga C, PA-C  omeprazole (PRILOSEC) 20 MG capsule Take 1 capsule (20 mg total) by mouth 2 (two) times daily before a meal for 15 days. 07/21/20 08/05/20  Jabari Swoveland C, PA-C  TRI-LO-SPRINTEC 0.18/0.215/0.25 MG-25 MCG tab TAKE 1 TABLET BY MOUTH DAILY 07/06/20   Simmons-Robinson, Tawanna Cooler, MD  Vitamin D, Ergocalciferol, (DRISDOL)  1.25 MG (50000 UNIT) CAPS capsule TAKE 1 CAPSULE BY MOUTH EVERY 7 DAYS 07/06/20   Simmons-Robinson, Tawanna Cooler, MD    Family History Family History  Problem Relation Age of Onset   Diabetes Mother    Hypertension Mother    Diabetes Maternal Aunt    Diabetes Maternal Grandmother    Diabetes Sister    Mental retardation Sister    Diabetes Maternal Uncle    Diabetes Maternal Grandfather     Social History Social History   Tobacco Use   Smoking status: Never   Smokeless tobacco: Never  Vaping Use   Vaping Use: Never used  Substance Use Topics   Alcohol use: No   Drug use: No     Allergies   Patient has no known allergies.   Review of Systems Review of Systems  Constitutional:  Negative for fatigue and fever.  HENT:   Negative for congestion, sinus pressure and sore throat.   Eyes:  Negative for photophobia, pain and visual disturbance.  Respiratory:  Negative for cough and shortness of breath.   Cardiovascular:  Negative for chest pain.  Gastrointestinal:  Positive for abdominal pain, nausea and vomiting.  Genitourinary:  Negative for decreased urine volume and hematuria.  Musculoskeletal:  Negative for myalgias, neck pain and neck stiffness.  Neurological:  Positive for dizziness and headaches. Negative for syncope, facial asymmetry, speech difficulty, weakness, light-headedness and numbness.    Physical Exam Triage Vital Signs ED Triage Vitals  Enc Vitals Group     BP      Pulse      Resp      Temp      Temp src      SpO2      Weight      Height      Head Circumference      Peak Flow      Pain Score      Pain Loc      Pain Edu?      Excl. in GC?    No data found.  Updated Vital Signs BP 131/77   Pulse 81   Temp 97.6 F (36.4 C) (Oral)   Resp 20   SpO2 98%   Visual Acuity Right Eye Distance:   Left Eye Distance:   Bilateral Distance:    Right Eye Near:   Left Eye Near:    Bilateral Near:     Physical Exam Vitals and nursing note reviewed.  Constitutional:      Appearance: She is well-developed.     Comments: No acute distress  HENT:     Head: Normocephalic and atraumatic.     Ears:     Comments: Bilateral ears without tenderness to palpation of external auricle, tragus and mastoid, EAC's without erythema or swelling, TM's with good bony landmarks and cone of light. Non erythematous.      Nose: Nose normal.     Mouth/Throat:     Comments: Oral mucosa pink and moist, no tonsillar enlargement or exudate. Posterior pharynx patent and nonerythematous, no uvula deviation or swelling. Normal phonation.  Eyes:     Conjunctiva/sclera: Conjunctivae normal.  Cardiovascular:     Rate and Rhythm: Normal rate and regular rhythm.  Pulmonary:     Effort: Pulmonary effort is  normal. No respiratory distress.     Comments: Breathing comfortably at rest, CTABL, no wheezing, rales or other adventitious sounds auscultated Abdominal:     General: There is no distension.     Palpations:  Abdomen is soft.     Comments: Soft, nondistended, tenderness to palpation to epigastrium, mid upper abdomen and mid lower abdomen, negative rebound, negative Rovsing, negative McBurney's, negative Murphy's  Musculoskeletal:        General: Normal range of motion.     Cervical back: Neck supple.  Skin:    General: Skin is warm and dry.  Neurological:     Mental Status: She is alert and oriented to person, place, and time.     UC Treatments / Results  Labs (all labs ordered are listed, but only abnormal results are displayed) Labs Reviewed - No data to display  EKG   Radiology No results found.  Procedures Procedures (including critical care time)  Medications Ordered in UC Medications  ketorolac (TORADOL) 30 MG/ML injection 30 mg (has no administration in time range)  alum & mag hydroxide-simeth (MAALOX/MYLANTA) 200-200-20 MG/5ML suspension 30 mL (30 mLs Oral Given 07/27/20 1312)    And  lidocaine (XYLOCAINE) 2 % viscous mouth solution 15 mL (15 mLs Oral Given 07/27/20 1312)    Initial Impression / Assessment and Plan / UC Course  I have reviewed the triage vital signs and the nursing notes.  Pertinent labs & imaging results that were available during my care of the patient were reviewed by me and considered in my medical decision making (see chart for details).     GI cocktail provided without relief of symptoms, possible duodenal ulcer, will trial Carafate while continuing PPI and Pepcid, Zofran as needed for nausea.  Providing Toradol prior to discharge to bypass GI tract for treatment of headache.  Does have some dizziness suggestive of vertigo given spinning sensation will trial meclizine.  Referral placed to gastroenterology given symptoms.  Discussed strict  return precautions. Patient verbalized understanding and is agreeable with plan.  Final Clinical Impressions(s) / UC Diagnoses   Final diagnoses:  Epigastric pain  Dizziness  Acute nonintractable headache, unspecified headache type     Discharge Instructions      We gave you a shot of Toradol for your headache We gave you Maalox and lidocaine in the GI cocktail Please continue omeprazole/Prilosec for full 2 weeks; continue Pepcid as needed Begin Carafate before meals and bedtime Zofran dissolved in mouth as needed for nausea May trial meclizine as needed for dizziness/nausea-may cause drowsiness, do not drive or work after taking Referral placed to gastroenterology Please follow-up in emergency room if symptoms continuing to progress/worsen     ED Prescriptions     Medication Sig Dispense Auth. Provider   ondansetron (ZOFRAN ODT) 4 MG disintegrating tablet Take 1 tablet (4 mg total) by mouth every 8 (eight) hours as needed for nausea or vomiting. 20 tablet Jibril Mcminn C, PA-C   sucralfate (CARAFATE) 1 g tablet Take 1 tablet (1 g total) by mouth 4 (four) times daily -  with meals and at bedtime. 30 tablet Inez Stantz C, PA-C   meclizine (ANTIVERT) 12.5 MG tablet Take 2 tablets (25 mg total) by mouth 3 (three) times daily as needed for dizziness. 30 tablet Demetrio Leighty, [place] C, PA-C      PDMP not reviewed this encounter.   Lew Dawes, [place] 07/27/20 1342

## 2020-07-27 NOTE — ED Triage Notes (Signed)
Pt returning here with burning epigastric pain, vomiting and constant headaches. Was given meds the last time she was here but states it has gotten worse.

## 2020-07-30 ENCOUNTER — Ambulatory Visit (HOSPITAL_COMMUNITY)
Admission: EM | Admit: 2020-07-30 | Discharge: 2020-07-30 | Disposition: A | Discharge status: Home / Self Care | Payer: Medicaid Other | Attending: Internal Medicine | Admitting: Internal Medicine

## 2020-07-30 ENCOUNTER — Encounter (HOSPITAL_COMMUNITY): Payer: Self-pay | Admitting: Emergency Medicine

## 2020-07-30 ENCOUNTER — Other Ambulatory Visit: Payer: Self-pay

## 2020-07-30 DIAGNOSIS — R1084 Generalized abdominal pain: Secondary | ICD-10-CM

## 2020-07-30 NOTE — ED Triage Notes (Signed)
Intermittent chest pain since having her last child. Central/epigastric abdominal pain increasing over last month. Frequent nausea and vomiting since the start of the abdominal pain. Nothing makes it better.

## 2020-07-30 NOTE — ED Provider Notes (Signed)
[facility]    CSN: [id number] Arrival date & time: 07/30/20  1144      History   Chief Complaint Chief Complaint  Patient presents with   Abdominal Pain   Chest Pain    HPI Kristen Dean is a 20 y.o. female.   HPI  Abdominal Pain: Patient states that she has had epigastric abdominal pain/chest pain for the past month. Some symptoms present off and on since the birth of her son in March (vaginal delivery which was non-complicated per patient). She has been seen 2 other times recently at our office on 07/21/2020 and 07/27/2020.  She has trialed omeprazole, Pepcid, meclizine, Zofran, sucralfate, GI cocktail all without improvement.  She also has had negative pregnancy test and negative urinalysis.  At her last visit GI referral was placed but she has not yet seen them.  Today she states that her symptoms are worsening.  She reports her pain as a 9 out of 10 in nature.  She describes the pain as a burning deep sensation in her abdomen.  She states that it does worsen at times and it causes her to cry and want to vomit.  None of the medications have been helpful.  She does have an appointment on the third to see the specialist but she does not believe she can wait that long.  She states that she wishes to go to the hospital for further evaluation.  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 Authorizing Provider  famotidine (PEPCID) 20 MG tablet Take 1 tablet (20 mg total) by mouth 2 (two) times daily. 07/21/20   Wieters, Hallie C, PA-C  meclizine (ANTIVERT) 12.5 MG tablet Take 2 tablets (25 mg total) by mouth 3 (three) times daily as needed for dizziness. 07/27/20   Wieters, Hallie C, PA-C  omeprazole (PRILOSEC) 20 MG capsule Take 1 capsule (20 mg total) by mouth 2 (two) times daily before a meal for 15 days. 07/21/20 08/05/20  Wieters, Hallie C, PA-C  ondansetron (ZOFRAN ODT) 4 MG disintegrating tablet Take 1 tablet (4 mg total) by mouth every 8 (eight) hours as needed for nausea or vomiting. 07/27/20   Wieters, Hallie C, PA-C  sucralfate (CARAFATE) 1 g tablet Take 1 tablet (1 g total) by mouth 4 (four) times daily -  with meals and at bedtime.  07/27/20   Wieters, Hallie C, PA-C  TRI-LO-SPRINTEC 0.18/0.215/0.25 MG-25 MCG tab TAKE 1 TABLET BY MOUTH DAILY 07/06/20   Simmons-Robinson, [place]iera, MD  Vitamin D, Ergocalciferol, (DRISDOL) 1.25 MG (50000 UNIT) CAPS capsule TAKE 1 CAPSULE BY MOUTH EVERY 7 DAYS 07/06/20   Simmons-Robinson, Tawanna Cooler, MD    Family History Family History  Problem Relation Age of Onset   Diabetes Mother    Hypertension Mother    Diabetes Maternal Aunt    Diabetes Maternal Grandmother    Diabetes Sister    Mental retardation Sister    Diabetes Maternal Uncle    Diabetes Maternal Grandfather     Social History Social History   Tobacco Use   Smoking status: Never   Smokeless tobacco: Never  Vaping Use   Vaping Use: Never used  Substance Use Topics   Alcohol use: No   Drug use: No     Allergies   Patient has no known allergies.   Review of Systems Review of Systems  As stated  above in HPI Physical Exam Triage Vital Signs ED Triage Vitals  Enc Vitals Group     BP 07/30/20 1207 116/76     Pulse Rate 07/30/20 1207 79     Resp 07/30/20 1207 14     Temp 07/30/20 1207 98.9 F (37.2 C)     Temp Source 07/30/20 1207 Oral     SpO2 07/30/20 1207 99 %     Weight --      Height --      Head Circumference --      Peak Flow --      Pain Score 07/30/20 1208 8     Pain Loc --      Pain Edu? --      Excl. in GC? --    No data found.  Updated Vital Signs BP 116/76 (BP Location: Right Arm)   Pulse 79   Temp 98.9 F (37.2 C) (Oral)   Resp 14   SpO2 99%   Physical Exam Vitals and nursing note reviewed.  Constitutional:      General: She is not in acute distress.    Appearance: She is well-developed. She is not ill-appearing, toxic-appearing or diaphoretic.  HENT:     Head: Normocephalic and atraumatic.     Mouth/Throat:     Mouth: Mucous membranes are moist.  Eyes:     Extraocular Movements: Extraocular movements intact.     Pupils: Pupils are equal, round, and reactive to light.     Comments: No pallor or jaundice  Cardiovascular:     Rate and Rhythm: Normal rate and regular rhythm.     Heart sounds: Normal heart sounds.     Comments: No reproducible chest pain Pulmonary:     Effort: Pulmonary effort is normal.     Breath sounds: Normal breath sounds.  Abdominal:     General: Abdomen is flat. Bowel sounds are normal.     Palpations: Abdomen is soft.     Tenderness: There is generalized abdominal tenderness. There is guarding and rebound. There is no right CVA tenderness or left CVA tenderness. Negative signs include Murphy's sign, Rovsing's sign, McBurney's sign, psoas sign and obturator sign.     Hernia: No hernia is present.  Skin:    General: Skin is warm.     Coloration: Skin is not cyanotic or jaundiced.  Neurological:     Mental Status: She is alert and oriented to person, place, and time.  UC Treatments / Results  Labs (all labs  ordered are listed, but only abnormal results are displayed) Labs Reviewed - No data to display  EKG   Radiology No results found.  Procedures Procedures (including critical care time)  Medications Ordered in UC Medications - No data to display  Initial Impression / Assessment and Plan / UC Course  I have reviewed the triage vital signs and the nursing notes.  Pertinent labs & imaging results that were available during my care of the patient were reviewed by me and considered in my medical decision making (see chart for details).     New.  I discussed with patient my concerns regarding her symptoms and history.  I agree with her that she needs further evaluation in the hospital for work-up to rule out such items such as retained placenta, appendicitis versus other. Final Clinical Impressions(s) / UC Diagnoses   Final diagnoses:  None   Discharge Instructions   None    ED Prescriptions   None    PDMP not reviewed this encounter.   Rushie Chestnut, [place] 07/30/20 1245

## 2020-08-04 ENCOUNTER — Other Ambulatory Visit (INDEPENDENT_AMBULATORY_CARE_PROVIDER_SITE_OTHER): Payer: Medicaid Other

## 2020-08-04 ENCOUNTER — Encounter: Payer: Self-pay | Admitting: Gastroenterology

## 2020-08-04 ENCOUNTER — Ambulatory Visit (INDEPENDENT_AMBULATORY_CARE_PROVIDER_SITE_OTHER): Payer: Medicaid Other | Admitting: Gastroenterology

## 2020-08-04 VITALS — BP 104/68 | HR 83 | Ht 68.0 in | Wt 233.1 lb

## 2020-08-04 DIAGNOSIS — R1084 Generalized abdominal pain: Secondary | ICD-10-CM

## 2020-08-04 DIAGNOSIS — R112 Nausea with vomiting, unspecified: Secondary | ICD-10-CM | POA: Diagnosis not present

## 2020-08-04 HISTORY — DX: Generalized abdominal pain: R10.84

## 2020-08-04 LAB — CBC WITH DIFFERENTIAL/PLATELET
Basophils Absolute: 0 10*3/uL (ref 0.0–0.1)
Basophils Relative: 0.6 % (ref 0.0–3.0)
Eosinophils Absolute: 0.1 10*3/uL (ref 0.0–0.7)
Eosinophils Relative: 1 % (ref 0.0–5.0)
HCT: 37.8 % (ref 36.0–46.0)
Hemoglobin: 12.6 g/dL (ref 12.0–15.0)
Lymphocytes Relative: 30.3 % (ref 12.0–46.0)
Lymphs Abs: 2.4 10*3/uL (ref 0.7–4.0)
MCHC: 33.3 g/dL (ref 30.0–36.0)
MCV: 86.7 fl (ref 78.0–100.0)
Monocytes Absolute: 0.5 10*3/uL (ref 0.1–1.0)
Monocytes Relative: 6.8 % (ref 3.0–12.0)
Neutro Abs: 4.9 10*3/uL (ref 1.4–7.7)
Neutrophils Relative %: 61.3 % (ref 43.0–77.0)
Platelets: 325 10*3/uL (ref 150.0–400.0)
RBC: 4.36 Mil/uL (ref 3.87–5.11)
RDW: 14.2 % (ref 11.5–15.5)
WBC: 8 10*3/uL (ref 4.0–10.5)

## 2020-08-04 LAB — COMPREHENSIVE METABOLIC PANEL
ALT: 10 U/L (ref 0–35)
AST: 13 U/L (ref 0–37)
Albumin: 3.9 g/dL (ref 3.5–5.2)
Alkaline Phosphatase: 58 U/L (ref 39–117)
BUN: 10 mg/dL (ref 6–23)
CO2: 25 mEq/L (ref 19–32)
Calcium: 9.4 mg/dL (ref 8.4–10.5)
Chloride: 102 mEq/L (ref 96–112)
Creatinine, Ser: 0.73 mg/dL (ref 0.40–1.20)
GFR: 117.17 mL/min (ref >60.00)
Glucose, Bld: 87 mg/dL (ref 70–99)
Potassium: 4.1 mEq/L (ref 3.5–5.1)
Sodium: 136 mEq/L (ref 135–145)
Total Bilirubin: 0.2 mg/dL (ref 0.2–1.2)
Total Protein: 7.8 g/dL (ref 6.0–8.3)

## 2020-08-04 LAB — SEDIMENTATION RATE: Sed Rate: 32 mm/hr — ABNORMAL HIGH (ref 0–20)

## 2020-08-04 LAB — C-REACTIVE PROTEIN: CRP: 1.6 mg/dL (ref 0.5–20.0)

## 2020-08-04 MED ORDER — DICYCLOMINE HCL 10 MG PO CAPS: 10.0000 mg | ORAL_CAPSULE | Freq: Three times a day (TID) | ORAL | 2 refills | Status: DC

## 2020-08-04 NOTE — Progress Notes (Signed)
08/04/2020 Kristen Dean [id number] 08/06/2000   HISTORY OF PRESENT ILLNESS:  This is a 20 year old female who is new to our office.  She is 5 months post-partum.  She presents here today for complaints of abdominal pain with nausea and vomiting.  She tells me that on June 19 she accidentally ate a "weed-containing" gummy and since that time her stomach has been messed up.  She describes this as a burning sensation diffusely in her abdomen, mostly in the lower abdomen and mid abdomen actually.  They told her she probably has GERD when she was seen at the urgent care.  No imaging or labs have been performed.  She says that she was given GI cocktail and that that did not help.  She also tried Pepcid and omeprazole for short period of time but none of those seemed to help.  She says that she randomly will have episodes of nausea and vomiting.  She says that the pain is keeping her out of work and wakes her from sleep at times.  She has not lost any weight, in fact, says that she has gained about 10 pounds.  She was using ibuprofen, but was more so taking it for this pain.  She has since discontinued it.  She says that her bowel movements are fine, no constipation or diarrhea.  No rectal bleeding.   Past Medical History:  Diagnosis Date   GERD (gastroesophageal reflux disease) 08/08/2017   Medical history non-contributory    Tension headache 06/16/2020   Past Surgical History:  Procedure Laterality Date   NO PAST SURGERIES      reports that she has never smoked. She has never used smokeless tobacco. She reports current alcohol use. She reports that she does not use drugs. family history includes Diabetes in her maternal aunt, maternal grandfather, maternal grandmother, maternal uncle, mother, and sister; Heart disease in her maternal aunt and maternal grandfather; Hypertension in her mother; Mental retardation in her sister. No Known Allergies    Outpatient Encounter Medications as of  08/04/2020  Medication Sig   Vitamin D, Ergocalciferol, (DRISDOL) 1.25 MG (50000 UNIT) CAPS capsule TAKE 1 CAPSULE BY MOUTH EVERY 7 DAYS   [DISCONTINUED] famotidine (PEPCID) 20 MG tablet Take 1 tablet (20 mg total) by mouth 2 (two) times daily. (Patient not taking: Reported on 08/04/2020)   [DISCONTINUED] meclizine (ANTIVERT) 12.5 MG tablet Take 2 tablets (25 mg total) by mouth 3 (three) times daily as needed for dizziness. (Patient not taking: Reported on 08/04/2020)   [DISCONTINUED] omeprazole (PRILOSEC) 20 MG capsule Take 1 capsule (20 mg total) by mouth 2 (two) times daily before a meal for 15 days. (Patient not taking: Reported on 08/04/2020)   [DISCONTINUED] ondansetron (ZOFRAN ODT) 4 MG disintegrating tablet Take 1 tablet (4 mg total) by mouth every 8 (eight) hours as needed for nausea or vomiting. (Patient not taking: Reported on 08/04/2020)   [DISCONTINUED] sucralfate (CARAFATE) 1 g tablet Take 1 tablet (1 g total) by mouth 4 (four) times daily -  with meals and at bedtime. (Patient not taking: Reported on 08/04/2020)   [DISCONTINUED] TRI-LO-SPRINTEC 0.18/0.215/0.25 MG-25 MCG tab TAKE 1 TABLET BY MOUTH DAILY (Patient not taking: Reported on 08/04/2020)   No facility-administered encounter medications on file as of 08/04/2020.     REVIEW OF SYSTEMS  : All other systems reviewed and negative except where noted in the History of Present Illness.   PHYSICAL EXAM: BP 104/68 (BP Location: Left Arm, Patient Position:  Sitting, Cuff Size: Large)   Pulse 83   Ht 5\' 8"  (1.727 m)   Wt 233 lb 2 oz (105.7 kg)   SpO2 98%   BMI 35.45 kg/m  General: Well developed AA female in no acute distress Head: Normocephalic and atraumatic Eyes:  Sclerae anicteric, conjunctiva pink. Ears: Normal auditory acuity Lungs: Clear throughout to auscultation; no W/R/R. Heart: Regular rate and rhythm; no M/R/G. Abdomen: Soft, non-distended.  BS present.  Mild diffuse TTP. Musculoskeletal: Symmetrical with no gross deformities   Skin: No lesions on visible extremities Extremities: No edema  Neurological: Alert oriented x 4, grossly non-focal Psychological:  Alert and cooperative. Normal mood and affect  ASSESSMENT AND PLAN: *20 year old female with complaints of generalized abdominal pain that she describes as a burning pain and intermittent nausea and vomiting.  No bowel issues.  This is all been present since you 19th.  No improvement with GI cocktail, short courses of Pepcid and PPI, etc.  Her pain has not only in the upper abdomen, but also mid abdomen/periumbilical region and lower abdomen as well.  She is very mildly tender diffusely on exam.  Not quite sure what is causing her symptoms.  We will plan for CT scan of the abdomen and pelvis with contrast.  We will check a CBC, CMP, TSH, sed rate, CRP, celiac labs today.  I will have her start bentyl 10 mg TID.  Prescription sent to pharmacy.   CC:  Simmons-Robinson, Makie*

## 2020-08-04 NOTE — Patient Instructions (Signed)
If you are age 22 or younger, your body mass index should be between 19-25. Your Body mass index is 35.45 kg/m. If this is out of the aformentioned range listed, please consider follow up with your Primary Care Provider.  __________________________________________________________  The [place] [facility] providers would like to encourage you to use [facility] to communicate with providers for non-urgent requests or questions.  Due to long hold times on the telephone, sending your provider a message by [facility] may be a faster and more efficient way to get a response.  Please allow 48 business hours for a response.  Please remember that this is for non-urgent requests.   You have been scheduled for a CT scan of the abdomen and pelvis at [facility], 1st floor Radiology. You are scheduled on _______  at ________. You should arrive 15 minutes prior to your appointment time for registration.  Please pick up 2 bottles of contrast from [facility] at least 3 days prior to your scan. The solution may taste better if refrigerated, but do NOT add ice or any other liquid to this solution. Shake well before drinking.   Please follow the written instructions below on the day of your exam:   1) Do not eat anything after _______ (4 hours prior to your test)   2) Drink 1 bottle of contrast @ ______ (2 hours prior to your exam)  Remember to shake well before drinking and do NOT pour over ice.     Drink 1 bottle of contrast @ _______ (1 hour prior to your exam)   You may take any medications as prescribed with a small amount of water, if necessary. If you take any of the following medications: METFORMIN, GLUCOPHAGE, GLUCOVANCE, AVANDAMET, RIOMET, FORTAMET, [place] MET, JANUMET, GLUMETZA or METAGLIP, you MAY be asked to HOLD this medication 48 hours AFTER the exam.   The purpose of you drinking the oral contrast is to aid in the visualization of your intestinal tract. The contrast solution may cause some diarrhea.  Depending on your individual set of symptoms, you may also receive an intravenous injection of x-ray contrast/dye. Plan on being at [facility] for 45 minutes or longer, depending on the type of exam you are having performed.   If you have any questions regarding your exam or if you need to reschedule, you may call [facility] at [phone] between the hours of 8:00 am and 5:00 pm, Monday-Friday.   Your provider has requested that you go to the basement level for lab work before leaving today. Press "B" on the elevator. The lab is located at the first door on the left as you exit the elevator.  START Dicyclomine 10 mg 1 tablet three times daily with meals  Follow up pending your labs and CT  Thank you for entrusting me with your care and choosing [facility].  Alonza Bogus, PA-C

## 2020-08-05 LAB — TISSUE TRANSGLUTAMINASE, IGA: (tTG) Ab, IgA: <1 U/mL

## 2020-08-05 LAB — IGA: Immunoglobulin A: 109 mg/dL (ref 47–310)

## 2020-08-06 NOTE — Progress Notes (Signed)
____________________________________________________________  Attending physician addendum:  Thank you for sending this case to me. I have reviewed the entire note and agree with the plan.  Agree it sounds difficult to characterize, and I agree with a lab and imaging work-up.  Amada Jupiter, MD  ____________________________________________________________

## 2020-08-09 ENCOUNTER — Other Ambulatory Visit: Payer: Self-pay

## 2020-08-09 ENCOUNTER — Ambulatory Visit
Admission: EM | Admit: 2020-08-09 | Discharge: 2020-08-09 | Disposition: A | Discharge status: Home / Self Care | Payer: Medicaid Other | Attending: Family Medicine | Admitting: Family Medicine

## 2020-08-09 DIAGNOSIS — J029 Acute pharyngitis, unspecified: Secondary | ICD-10-CM | POA: Diagnosis not present

## 2020-08-09 LAB — POCT RAPID STREP A (OFFICE): Rapid Strep A Screen: NEGATIVE

## 2020-08-09 NOTE — ED Provider Notes (Signed)
[facility]    CSN: [id number] Arrival date & time: 08/09/20  1407      History   Chief Complaint Chief Complaint  Patient presents with   Sore Throat    HPI Kristen Dean is a 20 y.o. female.  Complains of sore throat actually has other symptoms including reflux and abdominal pain.  Denies cough.  She is taking dicyclomine for abdominal cramping. 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   Chest pain, unspecified 11/11/2019   GERD (gastroesophageal reflux disease) 08/08/2017   Nausea and vomiting 02/16/2017   Vitamin D deficiency 02/21/2015   Learning disability 01/27/2013    Past Surgical History:  Procedure Laterality Date   NO PAST SURGERIES      OB History     Gravida  4   Para  2   Term  2   Preterm      AB  2   Living  2      SAB  2   IAB      Ectopic      Multiple  0   Live Births  2        Obstetric Comments  Pregnancy #2 Baby Boy, Vit D Deficiency, Fetal Pyelectasis that resolved          Home Medications    Prior to Admission medications   Medication Sig Start Date End Date Taking? Authorizing Provider  dicyclomine (BENTYL) 10 MG capsule Take 1 capsule (10 mg total) by mouth 3 (three) times daily before meals. 08/04/20   Zehr, Princella Pellegrini, PA-C  Vitamin D, Ergocalciferol, (DRISDOL) 1.25 MG (50000 UNIT) CAPS capsule TAKE 1 CAPSULE BY MOUTH EVERY 7 DAYS 07/06/20   Simmons-Robinson, Tawanna Cooler, MD    Family History Family History  Problem Relation Age of Onset   Diabetes Mother    Hypertension Mother    Diabetes Sister    Mental retardation Sister    Heart disease Maternal Aunt    Diabetes Maternal Aunt    Diabetes Maternal Uncle     Diabetes Maternal Grandmother    Heart disease Maternal Grandfather    Diabetes Maternal Grandfather    Colon cancer Neg Hx    Esophageal cancer Neg Hx    Pancreatic cancer Neg Hx    Stomach cancer Neg Hx     Social History Social History   Tobacco Use   Smoking status: Never   Smokeless tobacco: Never  Vaping Use   Vaping Use: Never used  Substance Use Topics   Alcohol use: Yes    Comment: occ glass of wine   Drug use: No     Allergies   Patient has no known allergies.   Review of Systems Review of Systems  HENT:  Positive for sore throat.   Gastrointestinal:  Positive for abdominal pain.  All other systems reviewed and are negative.   Physical Exam Triage Vital Signs ED Triage Vitals  Enc Vitals Group     BP 08/09/20 1431 122/79     Pulse Rate 08/09/20 1431 88     Resp 08/09/20 1431 18     Temp 08/09/20 1431 98.9 F (37.2 C)     Temp Source 08/09/20 1431  Oral     SpO2 08/09/20 1431 97 %     Weight --      Height --      Head Circumference --      Peak Flow --      Pain Score 08/09/20 1429 6     Pain Loc --      Pain Edu? --      Excl. in GC? --    No data found.  Updated Vital Signs BP 122/79 (BP Location: Left Arm)   Pulse 88   Temp 98.9 F (37.2 C) (Oral)   Resp 18   SpO2 97%   Visual Acuity Right Eye Distance:   Left Eye Distance:   Bilateral Distance:    Right Eye Near:   Left Eye Near:    Bilateral Near:     Physical Exam Vitals and nursing note reviewed.  Constitutional:      Appearance: She is well-developed.  HENT:     Mouth/Throat:     Mouth: Mucous membranes are moist.     Pharynx: Posterior oropharyngeal erythema present.  Cardiovascular:     Rate and Rhythm: Normal rate and regular rhythm.  Pulmonary:     Effort: Pulmonary effort is normal.     Breath sounds: Normal breath sounds.  Neurological:     General: No focal deficit present.     Mental Status: She is alert and oriented to person, place, and time.      UC Treatments / Results  Labs (all labs ordered are listed, but only abnormal results are displayed) Labs Reviewed  POCT RAPID STREP A (OFFICE)    EKG   Radiology No results found.  Procedures Procedures (including critical care time)  Medications Ordered in UC Medications - No data to display  Initial Impression / Assessment and Plan / UC Course  I have reviewed the triage vital signs and the nursing notes.  Pertinent labs & imaging results that were available during my care of the patient were reviewed by me and considered in my medical decision making (see chart for details).     Viral pharyngitis.  Consider reflux as etiology Final Clinical Impressions(s) / UC Diagnoses   Final diagnoses:  None   Discharge Instructions   None    ED Prescriptions   None    PDMP not reviewed this encounter.   Frederica Kuster, MD 08/09/20 [phone]

## 2020-08-09 NOTE — ED Triage Notes (Signed)
Pt c/o sore throat, lower back pain and lower abd pain. Sore throat has been hurting since yesterday.

## 2020-08-11 LAB — CULTURE, GROUP A STREP (THRC)

## 2020-08-12 ENCOUNTER — Emergency Department (HOSPITAL_COMMUNITY): Payer: Medicaid Other

## 2020-08-12 ENCOUNTER — Other Ambulatory Visit: Payer: Self-pay

## 2020-08-12 ENCOUNTER — Encounter (HOSPITAL_COMMUNITY): Payer: Self-pay

## 2020-08-12 ENCOUNTER — Emergency Department (HOSPITAL_COMMUNITY)
Admission: EM | Admit: 2020-08-12 | Discharge: 2020-08-12 | Disposition: A | Discharge status: Home / Self Care | Payer: Medicaid Other | Attending: Emergency Medicine | Admitting: Emergency Medicine

## 2020-08-12 DIAGNOSIS — R1110 Vomiting, unspecified: Secondary | ICD-10-CM | POA: Diagnosis not present

## 2020-08-12 DIAGNOSIS — K219 Gastro-esophageal reflux disease without esophagitis: Secondary | ICD-10-CM | POA: Insufficient documentation

## 2020-08-12 DIAGNOSIS — R1084 Generalized abdominal pain: Secondary | ICD-10-CM | POA: Diagnosis not present

## 2020-08-12 DIAGNOSIS — R109 Unspecified abdominal pain: Secondary | ICD-10-CM | POA: Diagnosis not present

## 2020-08-12 LAB — CBC WITH DIFFERENTIAL/PLATELET
Abs Immature Granulocytes: 0.02 10*3/uL (ref 0.00–0.07)
Basophils Absolute: 0.1 10*3/uL (ref 0.0–0.1)
Basophils Relative: 1 %
Eosinophils Absolute: 0.2 10*3/uL (ref 0.0–0.5)
Eosinophils Relative: 2 %
HCT: 41.5 % (ref 36.0–46.0)
Hemoglobin: 13.3 g/dL (ref 12.0–15.0)
Immature Granulocytes: 0 %
Lymphocytes Relative: 32 %
Lymphs Abs: 3 10*3/uL (ref 0.7–4.0)
MCH: 28.3 pg (ref 26.0–34.0)
MCHC: 32 g/dL (ref 30.0–36.0)
MCV: 88.3 fL (ref 80.0–100.0)
Monocytes Absolute: 0.8 10*3/uL (ref 0.1–1.0)
Monocytes Relative: 8 %
Neutro Abs: 5.4 10*3/uL (ref 1.7–7.7)
Neutrophils Relative %: 57 %
Platelets: 395 10*3/uL (ref 150–400)
RBC: 4.7 MIL/uL (ref 3.87–5.11)
RDW: 13.8 % (ref 11.5–15.5)
WBC: 9.4 10*3/uL (ref 4.0–10.5)
nRBC: 0 % (ref 0.0–0.2)

## 2020-08-12 LAB — COMPREHENSIVE METABOLIC PANEL
ALT: 15 U/L (ref 0–44)
AST: 19 U/L (ref 15–41)
Albumin: 3.8 g/dL (ref 3.5–5.0)
Alkaline Phosphatase: 64 U/L (ref 38–126)
Anion gap: 8 (ref 5–15)
BUN: 7 mg/dL (ref 6–20)
CO2: 26 mmol/L (ref 22–32)
Calcium: 9.6 mg/dL (ref 8.9–10.3)
Chloride: 102 mmol/L (ref 98–111)
Creatinine, Ser: 0.73 mg/dL (ref 0.44–1.00)
GFR, Estimated: >60 mL/min (ref >60)
Glucose, Bld: 94 mg/dL (ref 70–99)
Potassium: 4.2 mmol/L (ref 3.5–5.1)
Sodium: 136 mmol/L (ref 135–145)
Total Bilirubin: 0.3 mg/dL (ref 0.3–1.2)
Total Protein: 8 g/dL (ref 6.5–8.1)

## 2020-08-12 LAB — URINALYSIS, ROUTINE W REFLEX MICROSCOPIC
Bilirubin Urine: NEGATIVE
Glucose, UA: NEGATIVE mg/dL
Hgb urine dipstick: NEGATIVE
Ketones, ur: NEGATIVE mg/dL
Leukocytes,Ua: NEGATIVE
Nitrite: NEGATIVE
Protein, ur: NEGATIVE mg/dL
Specific Gravity, Urine: 1.019 (ref 1.005–1.030)
pH: 7 (ref 5.0–8.0)

## 2020-08-12 LAB — I-STAT BETA HCG BLOOD, ED (MC, WL, AP ONLY): I-stat hCG, quantitative: <5 m[IU]/mL (ref <5)

## 2020-08-12 LAB — LIPASE, BLOOD: Lipase: 32 U/L (ref 11–51)

## 2020-08-12 MED ORDER — PANTOPRAZOLE SODIUM 20 MG PO TBEC: 20.0000 mg | DELAYED_RELEASE_TABLET | Freq: Every day | ORAL | 0 refills | Status: DC

## 2020-08-12 MED ORDER — IOHEXOL 300 MG/ML  SOLN
100.0000 mL | Freq: Once | INTRAMUSCULAR | Status: AC | PRN
  Administered 2020-08-12: 100 mL via INTRAVENOUS

## 2020-08-12 NOTE — ED Provider Notes (Signed)
Emergency Medicine Provider Triage Evaluation Note  Kristen Dean , a 20 y.o. female  was evaluated in triage.  Pt complains of abdominal pain.  Abdominal pain has been constant since June 19.  Patient states that abdominal pain waxes and wanes in intensity.  Pain has been worse recently.  She endorses intermittent nausea and vomiting.  Has vomited once in the last 24 hours.  Describes emesis as stomach contents.  Patient denies any fevers, chills vaginal pain, vaginal bleeding, vaginal discharge, dysuria, urinary frequency, hematuria, constipation, diarrhea.  LMP 8/2.  Review of Systems  Positive: Abdominal pain, nausea, vomiting Negative: fevers, chills vaginal pain, vaginal bleeding, vaginal discharge, dysuria, urinary frequency, hematuria, constipation, diarrhea  Physical Exam  BP 112/85   Pulse 81   Temp 98.7 F (37.1 C) (Oral)   Resp 16   LMP 08/03/2020 (Approximate)   SpO2 100%  Gen:   Awake, no distress   Resp:  Normal effort  MSK:   Moves extremities without difficulty  Other:  Abdomen soft, nondistended, diffuse tenderness throughout abdomen, no mass, no pulsatile mass, no guarding, no rebound tenderness.  Medical Decision Making  Medically screening exam initiated at 2:19 PM.  Appropriate orders placed.  Tiah Heckel was informed that the remainder of the evaluation will be completed by another provider, this initial triage assessment does not replace that evaluation, and the importance of remaining in the ED until their evaluation is complete.  The patient appears stable so that the remainder of the work up may be completed by another provider.      Haskel Schroeder, PA-C 08/12/20 1421    Pollyann Savoy, MD 08/12/20 [phone]

## 2020-08-12 NOTE — ED Notes (Signed)
Patient discharge instructions reviewed with the patient. The patient verbalized understanding. Patient discharged. 

## 2020-08-12 NOTE — Discharge Instructions (Addendum)
Call your gastroenterologist in the morning for recheck and return if you are having worsening pain or unable to tolerate liquids

## 2020-08-12 NOTE — ED Triage Notes (Signed)
Per pt: She has been having abdominal pain on and off since June. She has tried different medications without relief. Has vomiting on and off. Pt was supposed to have a CT scan today but it was moved to next week. Pt has had blood work with her dr since this has been happening. Pt denies blood in vomit or stools.

## 2020-08-12 NOTE — ED Provider Notes (Signed)
[facility] EMERGENCY DEPARTMENT Provider Note   CSN: [id number] Arrival date & time: 08/12/20  1303     History Chief Complaint  Patient presents with   Abdominal Pain    Kristen Dean is a 20 y.o. female.  HPI 20 year old female G2 P2 LMP early August with normal menstrual cycle presents today complaining of abdominal pain.  She states she has been having intermittent abdominal pain since June.  She describes it as diffuse in nature.  She states that there are no definitive worsening factors but she has had some vomiting after eating.  In general, she is tolerating p.o. without difficulty.  She has not noted fever, chills, UTI symptoms, or diarrhea.  She has not had similar symptoms in the past.  She has been seen by an urgent care and was scheduled for CT scan tomorrow but it was bumped till next week.  She presented today for ongoing evaluation.  She has follow-up with family practice center but states she has been unable to get in due to the volume.  She was previously on birth control but states that she stopped this to make sure that it was not causing her pain.  She denies any STIs and denies any abnormal vaginal discharge.  She reports that she has had a referral to gastroenterology.     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   Chest pain, unspecified 11/11/2019   GERD (gastroesophageal reflux disease) 08/08/2017   Nausea and vomiting 02/16/2017   Vitamin D deficiency 02/21/2015   Learning disability 01/27/2013    Past Surgical History:  Procedure Laterality Date   NO PAST SURGERIES       OB History     Gravida  4   Para  2   Term  2   Preterm      AB  2    Living  2      SAB  2   IAB      Ectopic      Multiple  0   Live Births  2        Obstetric Comments  Pregnancy #2 Baby Boy, Vit D Deficiency, Fetal Pyelectasis that resolved         Family History  Problem Relation Age of Onset   Diabetes Mother    Hypertension Mother    Diabetes Sister    Mental retardation Sister    Heart disease Maternal Aunt    Diabetes Maternal Aunt    Diabetes Maternal Uncle    Diabetes Maternal Grandmother    Heart disease Maternal Grandfather    Diabetes Maternal Grandfather    Colon cancer Neg Hx    Esophageal cancer Neg Hx    Pancreatic cancer Neg Hx    Stomach cancer Neg Hx     Social History   Tobacco Use   Smoking status: Never   Smokeless tobacco: Never  Vaping Use   Vaping Use: Never used  Substance Use Topics   Alcohol use: Yes    Comment: occ glass of wine   Drug use: No    Home Medications Prior to Admission medications   Medication Sig Start Date End Date Taking? Authorizing Provider  dicyclomine (  BENTYL) 10 MG capsule Take 1 capsule (10 mg total) by mouth 3 (three) times daily before meals. 08/04/20   Zehr, Shanda Bumps D, PA-C  Vitamin D, Ergocalciferol, (DRISDOL) 1.25 MG (50000 UNIT) CAPS capsule TAKE 1 CAPSULE BY MOUTH EVERY 7 DAYS 07/06/20   Simmons-Robinson, Tawanna Cooler, MD    Allergies    Patient has no known allergies.  Review of Systems   Review of Systems  All other systems reviewed and are negative.  Physical Exam Updated Vital Signs BP 120/79 (BP Location: Right Arm)   Pulse 64   Temp 98.7 F (37.1 C) (Oral)   Resp 18   LMP 08/03/2020 (Approximate)   SpO2 100%   Physical Exam Vitals and nursing note reviewed.  Constitutional:      Appearance: She is well-developed.  HENT:     Head: Normocephalic and atraumatic.     Mouth/Throat:     Mouth: Mucous membranes are moist.  Eyes:     Extraocular Movements: Extraocular movements intact.  Cardiovascular:     Rate and Rhythm: Normal rate and regular  rhythm.  Pulmonary:     Effort: Pulmonary effort is normal.     Breath sounds: Normal breath sounds.  Abdominal:     General: Abdomen is flat. Bowel sounds are normal.     Palpations: Abdomen is soft.     Tenderness: There is generalized abdominal tenderness.  Skin:    General: Skin is warm and dry.     Capillary Refill: Capillary refill takes less than 2 seconds.  Neurological:     General: No focal deficit present.     Mental Status: She is alert.  Psychiatric:        Mood and Affect: Mood normal.        Behavior: Behavior normal.    ED Results / Procedures / Treatments   Labs (all labs ordered are listed, but only abnormal results are displayed) Labs Reviewed  URINALYSIS, ROUTINE W REFLEX MICROSCOPIC - Abnormal; Notable for the following components:      Result Value   APPearance HAZY (*)    All other components within normal limits  CBC WITH DIFFERENTIAL/PLATELET  COMPREHENSIVE METABOLIC PANEL  LIPASE, BLOOD  I-STAT BETA HCG BLOOD, ED ([facility], [facility], [facility] ONLY)    EKG None  Radiology CT ABDOMEN PELVIS W CONTRAST  Result Date: 08/12/2020 CLINICAL DATA:  Generalized abdominal pain. EXAM: CT ABDOMEN AND PELVIS WITH CONTRAST TECHNIQUE: Multidetector CT imaging of the abdomen and pelvis was performed using the standard protocol following bolus administration of intravenous contrast. CONTRAST:  OMNIPAQUE IOHEXOL 300 MG/ML  SOLN COMPARISON:  None. FINDINGS: Lower chest: No acute abnormality. Hepatobiliary: No suspicious hepatic lesion. Gallbladder is unremarkable. No biliary ductal dilation. Pancreas: Within normal limits. Spleen: Within normal limits. Adrenals/Urinary Tract: Adrenal glands are unremarkable. Kidneys are normal, without renal calculi, solid enhancing lesion, or hydronephrosis. Bladder is unremarkable for degree of distension. Stomach/Bowel: Stomach is within normal limits. Appendix appears normal. No evidence of bowel wall thickening, distention, or inflammatory  changes. Vascular/Lymphatic: No significant vascular findings are present. No pathologically enlarged abdominal or pelvic lymph nodes. Reproductive: Dominant follicle in the right ovary otherwise the uterus and bilateral adnexa are unremarkable. Other: Trace pelvic free fluid, likely physiologic. Musculoskeletal: No acute or significant osseous findings. IMPRESSION: 1. No acute findings in the abdomen or pelvis. Normal appendix. 2. Trace pelvic free fluid, likely physiologic. Electronically Signed   By: Maudry Mayhew MD   On: 08/12/2020 18:26    Procedures Procedures  Medications Ordered in ED Medications  iohexol (OMNIPAQUE) 300 MG/ML solution 100 mL (100 mLs Intravenous Contrast Given 08/12/20 1757)    ED Course  I have reviewed the triage vital signs and the nursing notes.  Pertinent labs & imaging results that were available during my care of the patient were reviewed by me and considered in my medical decision making (see chart for details).    MDM Rules/Calculators/A&P                           20 year old G2, P2 who presents today complaining of minutes ongoing abdominal pain since June.  He has some mild tenderness with deep palpation on exam.  Labs are normal.  Urinalysis is clear.  Pregnancy test is negative.  CT scan shows no evidence of acute abnormality although she has a trace of his fluid that is likely physiological in her pelvis. Discussed findings with patient.  Plan to start Protonix.  Advised regarding need for follow-up and return precautions and voices understanding. Final Clinical Impression(s) / ED Diagnoses Final diagnoses:  Generalized abdominal pain    Rx / DC Orders ED Discharge Orders     None        Margarita Grizzle, MD 08/12/20 1845

## 2020-08-13 ENCOUNTER — Ambulatory Visit (HOSPITAL_COMMUNITY): Payer: Medicaid Other

## 2020-08-13 ENCOUNTER — Telehealth: Payer: Self-pay

## 2020-08-13 NOTE — Telephone Encounter (Signed)
Transition Care Management Unsuccessful Follow-up Telephone Call  Date of discharge and from where:  08/12/2020-[place]   Attempts:  1st Attempt  Reason for unsuccessful TCM follow-up call:  Left voice message    

## 2020-08-16 NOTE — Telephone Encounter (Signed)
Transition Care Management Follow-up Telephone Call Date of discharge and from where: 08/12/2020-[place]  How have you been since you were released from the hospital? Patient stated she is doing ok.  Any questions or concerns? No  Items Reviewed: Did the pt receive and understand the discharge instructions provided? Yes  Medications obtained and verified? Yes  Other? No  Any new allergies since your discharge? No  Dietary orders reviewed? N/A Do you have support at home? Yes   Home Care and Equipment/Supplies: Were home health services ordered? not applicable If so, what is the name of the agency? N/A  Has the agency set up a time to come to the patient's home? not applicable Were any new equipment or medical supplies ordered?  No What is the name of the medical supply agency? N/A Were you able to get the supplies/equipment? not applicable Do you have any questions related to the use of the equipment or supplies? No  Functional Questionnaire: (I = Independent and D = Dependent) ADLs: I  Bathing/Dressing- I  Meal Prep- I  Eating- I  Maintaining continence- I  Transferring/Ambulation- I  Managing Meds- I  Follow up appointments reviewed:  PCP Hospital f/u appt confirmed? Yes  Scheduled to see Dr. Neita Garnet on 08/30/2020 @ 4:10 pm. Specialist Hospital f/u appt confirmed? No   Are transportation arrangements needed? No  If their condition worsens, is the pt aware to call PCP or go to the Emergency Dept.? Yes Was the patient provided with contact information for the PCP's office or ED? Yes Was to pt encouraged to call back with questions or concerns? Yes

## 2020-08-17 ENCOUNTER — Telehealth: Payer: Self-pay | Admitting: Gastroenterology

## 2020-08-17 NOTE — Telephone Encounter (Signed)
Andreas Blower, this case was denied by [facility] for the CT scan, after reviewing clinical notes and an appeal being started on 8/11 the clinical review team decided to uphold the decision to deny. Please advise what you would like to do next, I spoke with pt's mother to inform her of this.

## 2020-08-17 NOTE — Telephone Encounter (Signed)
Since she had a CT at [facility] on 8/11 while being seen in the ER it should be fine.

## 2020-08-20 ENCOUNTER — Ambulatory Visit (HOSPITAL_COMMUNITY): Payer: Medicaid Other

## 2020-08-23 ENCOUNTER — Other Ambulatory Visit: Payer: Self-pay

## 2020-08-23 ENCOUNTER — Ambulatory Visit
Admission: EM | Admit: 2020-08-23 | Discharge: 2020-08-23 | Disposition: A | Discharge status: Home / Self Care | Payer: Medicaid Other

## 2020-08-23 ENCOUNTER — Ambulatory Visit
Admission: EM | Admit: 2020-08-23 | Discharge: 2020-08-23 | Disposition: A | Discharge status: Home / Self Care | Payer: Medicaid Other | Attending: Emergency Medicine | Admitting: Emergency Medicine

## 2020-08-23 DIAGNOSIS — R109 Unspecified abdominal pain: Secondary | ICD-10-CM

## 2020-08-23 DIAGNOSIS — R079 Chest pain, unspecified: Secondary | ICD-10-CM | POA: Diagnosis not present

## 2020-08-23 DIAGNOSIS — R0789 Other chest pain: Secondary | ICD-10-CM

## 2020-08-23 DIAGNOSIS — Z3202 Encounter for pregnancy test, result negative: Secondary | ICD-10-CM | POA: Diagnosis not present

## 2020-08-23 LAB — POCT URINALYSIS DIP (MANUAL ENTRY)
Bilirubin, UA: NEGATIVE
Blood, UA: NEGATIVE
Glucose, UA: NEGATIVE mg/dL
Ketones, POC UA: NEGATIVE mg/dL
Leukocytes, UA: NEGATIVE
Nitrite, UA: NEGATIVE
Protein Ur, POC: NEGATIVE mg/dL
Spec Grav, UA: 1.02 (ref 1.010–1.025)
Urobilinogen, UA: 0.2 E.U./dL
pH, UA: 7 (ref 5.0–8.0)

## 2020-08-23 LAB — POCT URINE PREGNANCY: Preg Test, Ur: NEGATIVE

## 2020-08-23 MED ORDER — ALUM & MAG HYDROXIDE-SIMETH 200-200-20 MG/5ML PO SUSP: 15.0000 mL | Freq: Four times a day (QID) | ORAL | 0 refills | Status: DC | PRN

## 2020-08-23 MED ORDER — HYDROXYZINE HCL 25 MG PO TABS: 25.0000 mg | ORAL_TABLET | Freq: Four times a day (QID) | ORAL | 0 refills | Status: DC | PRN

## 2020-08-23 NOTE — ED Triage Notes (Addendum)
Pt c/o medial chest pain, that does not radiate, denies vision changes, states she was prescribed Protonix and it is not effective. Pt also c/o urinary frequency and bilateral breast being sore.

## 2020-08-23 NOTE — Discharge Instructions (Addendum)
Trial of hydroxyzine as needed for anxiety, chest discomfort May try Maalox to help with underlying indigestion Follow-up with gastroenterology as planned

## 2020-08-23 NOTE — ED Provider Notes (Signed)
[facility]    CSN: [id number] Arrival date & time: 08/23/20  1205      History   Chief Complaint Chief Complaint  Patient presents with   Chest Pain    HPI Kristen Dean is a 20 y.o. female history of headache, GERD, presenting today for evaluation of chest discomfort.  Reports over the past couple of days she has had intermittent chest discomfort in her central chest.  Describes sensation as a sharp sensation at times.  Continues to have abdominal discomfort and burning which has been difficult to manage since symptoms initially began in June.  Has tried many various GERD/gastritis meds without relief.  Has follow-up with gastroenterology planned.  Recent CT unremarkable.  She denies history of hypertension, diabetes.  Denies history of DVT/PE.  She does report some breast soreness and associated urinary frequency.  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   Chest pain, unspecified 11/11/2019   GERD (gastroesophageal reflux disease) 08/08/2017   Nausea and vomiting 02/16/2017   Vitamin D deficiency 02/21/2015   Learning disability 01/27/2013    Past Surgical History:  Procedure Laterality Date   NO PAST SURGERIES      OB History     Gravida  4   Para  2   Term  2   Preterm      AB  2   Living  2      SAB  2   IAB      Ectopic      Multiple  0   Live Births  2        Obstetric Comments  Pregnancy #2 Baby Boy, Vit D Deficiency, Fetal Pyelectasis that resolved          Home Medications    Prior to Admission medications   Medication Sig Start Date End Date Taking? Authorizing Provider  alum & mag hydroxide-simeth (MAALOX/MYLANTA) 200-200-20  MG/5ML suspension Take 15-30 mLs by mouth every 6 (six) hours as needed for indigestion or heartburn. 08/23/20  Yes Jethro Radke C, PA-C  hydrOXYzine (ATARAX/VISTARIL) 25 MG tablet Take 1 tablet (25 mg total) by mouth every 6 (six) hours as needed for anxiety. 08/23/20  Yes Rosalina Dingwall C, PA-C  dicyclomine (BENTYL) 10 MG capsule Take 1 capsule (10 mg total) by mouth 3 (three) times daily before meals. 08/04/20   Zehr, Princella Pellegrini, PA-C  pantoprazole (PROTONIX) 20 MG tablet Take 1 tablet (20 mg total) by mouth daily. 08/12/20   Margarita Grizzle, MD  Vitamin D, Ergocalciferol, (DRISDOL) 1.25 MG (50000 UNIT) CAPS capsule TAKE 1 CAPSULE BY MOUTH EVERY 7 DAYS 07/06/20   Simmons-Robinson, Tawanna Cooler, MD    Family History Family History  Problem Relation Age of Onset   Diabetes Mother    Hypertension Mother    Diabetes Sister    Mental retardation Sister    Heart disease Maternal Aunt    Diabetes Maternal Aunt    Diabetes Maternal Uncle    Diabetes Maternal Grandmother    Heart disease Maternal Grandfather    Diabetes Maternal Grandfather    Colon cancer Neg Hx    Esophageal cancer Neg Hx    Pancreatic cancer Neg Hx    Stomach  cancer Neg Hx     Social History Social History   Tobacco Use   Smoking status: Never   Smokeless tobacco: Never  Vaping Use   Vaping Use: Never used  Substance Use Topics   Alcohol use: Yes    Comment: occ glass of wine   Drug use: No     Allergies   Patient has no known allergies.   Review of Systems Review of Systems  Constitutional:  Negative for fatigue and fever.  HENT:  Negative for congestion, sinus pressure and sore throat.   Eyes:  Negative for photophobia, pain and visual disturbance.  Respiratory:  Negative for cough and shortness of breath.   Cardiovascular:  Positive for chest pain.  Gastrointestinal:  Positive for abdominal pain. Negative for nausea and vomiting.  Genitourinary:  Negative for decreased urine volume and hematuria.   Musculoskeletal:  Negative for myalgias, neck pain and neck stiffness.  Neurological:  Negative for dizziness, syncope, facial asymmetry, speech difficulty, weakness, light-headedness, numbness and headaches.    Physical Exam Triage Vital Signs ED Triage Vitals [08/23/20 1231]  Enc Vitals Group     BP 126/79     Pulse Rate 69     Resp 18     Temp 98.6 F (37 C)     Temp Source Oral     SpO2 97 %     Weight      Height      Head Circumference      Peak Flow      Pain Score 5     Pain Loc      Pain Edu?      Excl. in GC?    No data found.  Updated Vital Signs BP 126/79 (BP Location: Left Arm)   Pulse 69   Temp 98.6 F (37 C) (Oral)   Resp 18   LMP 08/03/2020 (Approximate)   SpO2 97%   Visual Acuity Right Eye Distance:   Left Eye Distance:   Bilateral Distance:    Right Eye Near:   Left Eye Near:    Bilateral Near:     Physical Exam Vitals and nursing note reviewed.  Constitutional:      Appearance: She is well-developed.     Comments: No acute distress  HENT:     Head: Normocephalic and atraumatic.     Nose: Nose normal.     Mouth/Throat:     Comments: Oral mucosa pink and moist, no tonsillar enlargement or exudate. Posterior pharynx patent and nonerythematous, no uvula deviation or swelling. Normal phonation.  Eyes:     Conjunctiva/sclera: Conjunctivae normal.  Cardiovascular:     Rate and Rhythm: Normal rate and regular rhythm.  Pulmonary:     Effort: Pulmonary effort is normal. No respiratory distress.     Comments: Breathing comfortably at rest, CTABL, no wheezing, rales or other adventitious sounds auscultated   Abdominal:     General: There is no distension.  Musculoskeletal:        General: Normal range of motion.     Cervical back: Neck supple.  Skin:    General: Skin is warm and dry.  Neurological:     Mental Status: She is alert and oriented to person, place, and time.     UC Treatments / Results  Labs (all labs ordered are  listed, but only abnormal results are displayed) Labs Reviewed  POCT URINALYSIS DIP (MANUAL ENTRY) - Abnormal; Notable for the following components:      Result  Value   Color, UA light yellow (*)    Clarity, UA turbid (*)    All other components within normal limits  POCT URINE PREGNANCY    EKG   Radiology No results found.  Procedures Procedures (including critical care time)  Medications Ordered in UC Medications - No data to display  Initial Impression / Assessment and Plan / UC Course  I have reviewed the triage vital signs and the nursing notes.  Pertinent labs & imaging results that were available during my care of the patient were reviewed by me and considered in my medical decision making (see chart for details).     Pregnancy test negative, UA negative leuks and nitrites, EKG normal sinus rhythm, no acute signs of ischemia or infarction, discussed possible etiologies with patient, suspect most likely MSK etiology versus underlying GERD or possible anxiety correlation.  Provided trial of hydroxyzine to use as needed, Tylenol as needed for headaches/chest discomfort, may trial Maalox in combination with recent Protonix prescribed.  Follow-up with gastroenterology as planned.  Discussed strict return precautions. Patient verbalized understanding and is agreeable with plan.  Final Clinical Impressions(s) / UC Diagnoses   Final diagnoses:  Atypical chest pain     Discharge Instructions      Trial of hydroxyzine as needed for anxiety, chest discomfort May try Maalox to help with underlying indigestion Follow-up with gastroenterology as planned     ED Prescriptions     Medication Sig Dispense Auth. Provider   hydrOXYzine (ATARAX/VISTARIL) 25 MG tablet Take 1 tablet (25 mg total) by mouth every 6 (six) hours as needed for anxiety. 12 tablet Lissette Schenk C, PA-C   alum & mag hydroxide-simeth (MAALOX/MYLANTA) 200-200-20 MG/5ML suspension Take 15-30 mLs by mouth  every 6 (six) hours as needed for indigestion or heartburn. 355 mL Kiley Solimine, [place] C, PA-C      PDMP not reviewed this encounter.   Lew Dawes, [place] 08/23/20 1534

## 2020-08-29 NOTE — Progress Notes (Signed)
    SUBJECTIVE:   CHIEF COMPLAINT / HPI: concern for ADHD   Patient is speaking with a family friend who is a therapist and was recommended to be evaluated for ADHD. She reports trouble with attention span and reports that her mother states she has had those symptoms since she was a child. She reports being very forgetful. Her brother has been diagnosed with ADHD. She also reports feeling overstimulated when her children touch her. Her major concern is her forgetfulness. She often forgets what people from work will tell her and it creates stressful situation. Patient reports that she is disorganized and reports difficulty with her work as she feels it is a constant hurdle to overcome to finish her job.   HM  COVID vaccine recommended, patient declines    PERTINENT  PMH / PSH:  GERD  Tension HA  OBJECTIVE:   BP 134/83   Pulse 91   Wt 240 lb 9.6 oz (109.1 kg)   LMP 08/03/2020 (Approximate)   SpO2 99%   BMI 36.58 kg/m   General: female appearing stated age in no acute distress Cardio: Normal S1 and S2, no S3 or S4. Rhythm is regular. No murmurs or rubs.  Bilateral radial pulses palpable Pulm: Clear to auscultation bilaterally, no crackles, wheezing, or diminished breath sounds. Normal respiratory effort, stable on room air Abdomen: Bowel sounds normal. Abdomen soft and non-tender. Extremities: No peripheral edema. Warm/ well perfused. Neuro: pt alert and oriented x4  Psych: makes apropriate eye contact, patient has increased rate of speech, denies symptoms of mania, denies SI/HI, normal movements   ASSESSMENT/PLAN:   Attention deficit Patient presents for request to have formal evaluation for ADHD given history of forgetfulness, decreased attention span. -Patient given information to contact [facility] -Patient to follow-up as recommended in AVS     Ronnald Ramp, MD [facility]

## 2020-08-29 NOTE — Patient Instructions (Addendum)
It was a pleasure to see you today!  Thank you for choosing [facility] for your primary care.   Kristen Dean was seen for ADHD concerns.   Our plans for today were: Contact [facility] clinic for formal evaluation. Please go to the website below to fill out the patient interest form.  [email or web address]  To keep you healthy, please keep in mind the following health maintenance items that you are due for:   COVID vaccine   Influenza testing    You should return to our clinic in 3 weeks for follow up.   Best Wishes,   Dr. Neita Garnet     Psychiatry Resource List (Adults and Children) Most of these providers will take Medicaid. please consult your insurance for a complete and updated list of available providers. When calling to make an appointment have your insurance information available to confirm you are covered.   [facility]ry [facility] Counseling [address] [place], [place] [address] [phone]  [facility]  [address] [place], [place] [phone] Crisis [phone]   [facility]:   [facility]: [address]     [phone]   [facility]: [address] [place]. [place],        [phone] [place]: [address] Suite [phone],    [phone] [place]: [phone] [address],                   [phone] Children: [facility] [address]         [phone]  [facility] (virtual only) [phone]

## 2020-08-30 ENCOUNTER — Ambulatory Visit (INDEPENDENT_AMBULATORY_CARE_PROVIDER_SITE_OTHER): Payer: Medicaid Other | Admitting: Family Medicine

## 2020-08-30 ENCOUNTER — Encounter: Payer: Self-pay | Admitting: Family Medicine

## 2020-08-30 ENCOUNTER — Other Ambulatory Visit: Payer: Self-pay

## 2020-08-30 DIAGNOSIS — R41840 Attention and concentration deficit: Secondary | ICD-10-CM

## 2020-09-02 DIAGNOSIS — R41840 Attention and concentration deficit: Secondary | ICD-10-CM

## 2020-09-02 HISTORY — DX: Attention and concentration deficit: R41.840

## 2020-09-02 NOTE — Assessment & Plan Note (Signed)
Patient presents for request to have formal evaluation for ADHD given history of forgetfulness, decreased attention span. -Patient given information to contact [facility] -Patient to follow-up as recommended in AVS

## 2020-09-10 ENCOUNTER — Ambulatory Visit (INDEPENDENT_AMBULATORY_CARE_PROVIDER_SITE_OTHER): Payer: Medicaid Other

## 2020-09-10 ENCOUNTER — Ambulatory Visit (HOSPITAL_COMMUNITY)
Admission: EM | Admit: 2020-09-10 | Discharge: 2020-09-10 | Disposition: A | Discharge status: Home / Self Care | Payer: Medicaid Other | Attending: Student | Admitting: Student

## 2020-09-10 ENCOUNTER — Other Ambulatory Visit: Payer: Self-pay

## 2020-09-10 ENCOUNTER — Encounter (HOSPITAL_COMMUNITY): Payer: Self-pay | Admitting: Emergency Medicine

## 2020-09-10 ENCOUNTER — Ambulatory Visit
Admission: EM | Admit: 2020-09-10 | Discharge: 2020-09-10 | Disposition: A | Discharge status: Home / Self Care | Payer: Medicaid Other | Attending: Physician Assistant | Admitting: Physician Assistant

## 2020-09-10 ENCOUNTER — Ambulatory Visit (HOSPITAL_COMMUNITY): Admit: 2020-09-10 | Payer: Medicaid Other

## 2020-09-10 ENCOUNTER — Encounter: Payer: Self-pay | Admitting: Emergency Medicine

## 2020-09-10 DIAGNOSIS — M546 Pain in thoracic spine: Secondary | ICD-10-CM

## 2020-09-10 DIAGNOSIS — M25562 Pain in left knee: Secondary | ICD-10-CM

## 2020-09-10 DIAGNOSIS — M79605 Pain in left leg: Secondary | ICD-10-CM

## 2020-09-10 DIAGNOSIS — S8992XA Unspecified injury of left lower leg, initial encounter: Secondary | ICD-10-CM | POA: Diagnosis not present

## 2020-09-10 DIAGNOSIS — W19XXXA Unspecified fall, initial encounter: Secondary | ICD-10-CM | POA: Diagnosis not present

## 2020-09-10 MED ORDER — PREDNISONE 20 MG PO TABS: 40.0000 mg | ORAL_TABLET | Freq: Every day | ORAL | 0 refills | Status: AC

## 2020-09-10 MED ORDER — TIZANIDINE HCL 4 MG PO CAPS: 4.0000 mg | ORAL_CAPSULE | Freq: Three times a day (TID) | ORAL | 0 refills | Status: DC

## 2020-09-10 NOTE — ED Notes (Signed)
Spoke with [facility] for an appointment for patient.  Patient instructed to head over to hospital, check in at admissions.

## 2020-09-10 NOTE — Discharge Instructions (Addendum)
Go have ultrasound to rule out any counter blood clot given your location of pain and the fact that you take birth control pills.  I am more concerned about a meniscus injury or sprain to your knee.  Use brace to help manage her pain and provide some stability.  I also recommend that you use Tylenol, elevation, ice for additional symptom relief.  Use prednisone to help with pain and inflammation.  You should not take NSAIDs with this medication including aspirin, ibuprofen/Advil, naproxen/Aleve.  I have also called in a muscle relaxer which should help with your knee and back pain.  Do not drive or drink alcohol while taking this as drowsiness is a common side effect.  If your symptoms are not improving quickly Please follow-up with orthopedics as we discussed.  If anything worsens please go to the emergency room.

## 2020-09-10 NOTE — Discharge Instructions (Addendum)
-  Attend ultrasound appointment -Wear knee brace, pick up the prednisone and tizanidine -If symptoms persist in 4 days, follow-up with an orthopedist. I recommend [facility] at [address], [place], [place] [address]. You can schedule an appointment by calling [phone]) or online ([email or web address]), but they also have a walk-in clinic M-F 8a-8p and Sat 10a-3p.

## 2020-09-10 NOTE — ED Triage Notes (Signed)
Patient c/o left knee pain and middle back pain for a couple of days.  No apparent injury to the knee or back.  Patient unable to take Ibuprofen due to stomach issues.

## 2020-09-10 NOTE — ED Provider Notes (Signed)
[facility]    CSN: [id number] Arrival date & time: 09/10/20  1129      History   Chief Complaint Chief Complaint  Patient presents with   Knee Pain   Back Pain    HPI Princesa Willig is a 20 y.o. female.   Patient presents today with a several day history of left knee pain and back pain.  Reports back pain has been ongoing for several weeks and is intermittent but knee pain has become persistent over the past 4 days and is severe.  Pain is rated 7 on a 0-10 pain scale, localized to posterior/popliteal fossa of left knee with radiation into the leg, described as pressure/aching, worse with flexion or attempted ambulation, no alleviating factors identified.  Denies any instability, popping, clicking.  She has not tried any over-the-counter medications as she is unable to take NSAIDs.  She denies any known injury or change in activity prior to symptom onset.  She does have a history of intermittent knee pain but denies any previous surgeries.  She denies history of VTE event but is currently on birth control.  Denies any recent hospitalization, immobilization, recent travel, recent COVID-19 vaccination or diagnosis.  Denies any associated chest pain, shortness of breath, heart racing, headaches, dizziness.   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   Chest pain, unspecified 11/11/2019   GERD (gastroesophageal reflux disease) 08/08/2017   Nausea and vomiting 02/16/2017   Vitamin D deficiency 02/21/2015   Learning disability 01/27/2013    Past Surgical History:  Procedure Laterality Date   NO PAST SURGERIES      OB History     Gravida  4   Para  2    Term  2   Preterm      AB  2   Living  2      SAB  2   IAB      Ectopic      Multiple  0   Live Births  2        Obstetric Comments  Pregnancy #2 Baby Boy, Vit D Deficiency, Fetal Pyelectasis that resolved          Home Medications    Prior to Admission medications   Medication Sig Start Date End Date Taking? Authorizing Provider  alum & mag hydroxide-simeth (MAALOX/MYLANTA) 200-200-20 MG/5ML suspension Take 15-30 mLs by mouth every 6 (six) hours as needed for indigestion or heartburn. 08/23/20  Yes Wieters, Hallie C, PA-C  dicyclomine (BENTYL) 10 MG capsule Take 1 capsule (10 mg total) by mouth 3 (three) times daily before meals. 08/04/20  Yes Zehr, Princella Pellegrini, PA-C  hydrOXYzine (ATARAX/VISTARIL) 25 MG tablet Take 1 tablet (25 mg total) by mouth every 6 (six) hours as needed for anxiety. 08/23/20  Yes Wieters, Hallie C, PA-C  pantoprazole (PROTONIX) 20 MG tablet Take 1 tablet (20 mg total) by mouth daily. 08/12/20  Yes Margarita Grizzle, MD  predniSONE (DELTASONE) 20 MG tablet Take 2 tablets (40 mg total) by mouth daily with breakfast for 4 days. 09/10/20 09/14/20 Yes Erminie Foulks K, PA-C  tiZANidine (ZANAFLEX) 4 MG capsule Take 1 capsule (4 mg total) by mouth 3 (three) times daily.  09/10/20  Yes Aerilyn Slee K, PA-C  Vitamin D, Ergocalciferol, (DRISDOL) 1.25 MG (50000 UNIT) CAPS capsule TAKE 1 CAPSULE BY MOUTH EVERY 7 DAYS 07/06/20  Yes Simmons-Robinson, [place], MD    Family History Family History  Problem Relation Age of Onset   Diabetes Mother    Hypertension Mother    Diabetes Sister    Mental retardation Sister    Heart disease Maternal Aunt    Diabetes Maternal Aunt    Diabetes Maternal Uncle    Diabetes Maternal Grandmother    Heart disease Maternal Grandfather    Diabetes Maternal Grandfather    Colon cancer Neg Hx    Esophageal cancer Neg Hx    Pancreatic cancer Neg Hx    Stomach cancer Neg Hx     Social History Social History   Tobacco Use   Smoking  status: Never   Smokeless tobacco: Never  Vaping Use   Vaping Use: Never used  Substance Use Topics   Alcohol use: Yes    Comment: occ glass of wine   Drug use: No     Allergies   Patient has no known allergies.   Review of Systems Review of Systems  Constitutional:  Positive for activity change. Negative for appetite change and fatigue.  Respiratory:  Negative for cough and shortness of breath.   Cardiovascular:  Negative for chest pain, palpitations and leg swelling.  Musculoskeletal:  Positive for arthralgias, back pain and gait problem. Negative for joint swelling and myalgias.  Neurological:  Negative for dizziness, weakness, light-headedness, numbness and headaches.    Physical Exam Triage Vital Signs ED Triage Vitals  Enc Vitals Group     BP 09/10/20 1241 114/73     Pulse Rate 09/10/20 1241 63     Resp --      Temp 09/10/20 1241 98.7 F (37.1 C)     Temp Source 09/10/20 1241 Oral     SpO2 09/10/20 1241 99 %     Weight 09/10/20 1242 230 lb (104.3 kg)     Height 09/10/20 1242 5\' 8"  (1.727 m)     Head Circumference --      Peak Flow --      Pain Score 09/10/20 1242 7     Pain Loc --      Pain Edu? --      Excl. in GC? --    No data found.  Updated Vital Signs BP 114/73 (BP Location: Left Arm)   Pulse 63   Temp 98.7 F (37.1 C) (Oral)   Ht 5\' 8"  (1.727 m)   Wt 230 lb (104.3 kg)   LMP 08/29/2020   SpO2 99%   BMI 34.97 kg/m   Visual Acuity Right Eye Distance:   Left Eye Distance:   Bilateral Distance:    Right Eye Near:   Left Eye Near:    Bilateral Near:     Physical Exam Vitals reviewed.  Constitutional:      General: She is awake. She is not in acute distress.    Appearance: Normal appearance. She is not ill-appearing.     Comments: Very pleasant female appears stated age in no acute distress sitting comfortably in exam room  HENT:     Head: Normocephalic and atraumatic.  Cardiovascular:     Rate and Rhythm: Normal rate and regular  rhythm.     Heart sounds: Normal heart sounds, S1 normal and S2 normal. No murmur heard. Pulmonary:     Effort: Pulmonary effort is  normal.     Breath sounds: Normal breath sounds. No wheezing, rhonchi or rales.     Comments: Clear to auscultation bilaterally Musculoskeletal:     Cervical back: No tenderness or bony tenderness.     Thoracic back: Tenderness present. No bony tenderness.     Lumbar back: Tenderness present. No bony tenderness.     Left knee: No swelling or deformity. Decreased range of motion. Tenderness present over the medial joint line. No LCL laxity, MCL laxity, ACL laxity or PCL laxity.    Instability Tests: Anterior drawer test negative. Posterior drawer test negative.     Right lower leg: No edema.     Left lower leg: No edema.     Comments: Left knee/leg: Decreased range of motion with flexion and extension secondary to pain.  No ligamentous laxity on exam.  Positive Homans' sign.  Foot neurovascularly intact.  Tender to palpation in popliteal fossa without cords.  Psychiatric:        Behavior: Behavior is cooperative.     UC Treatments / Results  Labs (all labs ordered are listed, but only abnormal results are displayed) Labs Reviewed - No data to display  EKG   Radiology No results found.  Procedures Procedures (including critical care time)  Medications Ordered in UC Medications - No data to display  Initial Impression / Assessment and Plan / UC Course  I have reviewed the triage vital signs and the nursing notes.  Pertinent labs & imaging results that were available during my care of the patient were reviewed by me and considered in my medical decision making (see chart for details).      No bony tenderness or deformity throughout warrant plain x-rays at this time.  Patient is able to bear weight.  We discussed that given location of pain as well as that she is on OCP will obtain ultrasound to ensure VTE event is not contributing to symptoms.   Will treat with prednisone burst as patient is able to take NSAIDs as well as muscle relaxers.  She was placed in a knee brace to provide comfort and stability.  Recommend she use conservative treatment measures including RICE protocol.  Discussed that if symptoms or not improving over the weekend she should follow-up with orthopedic clinic and was given contact information for local orthopedic provider.  Discussed if she has any worsening symptoms including increased pain, leg swelling, chest pain, shortness of breath, inability to bear weight she needs to go to emergency room for further evaluation.  Final Clinical Impressions(s) / UC Diagnoses   Final diagnoses:  Acute pain of left knee  Left leg pain  Acute bilateral thoracic back pain     Discharge Instructions      Go have ultrasound to rule out any counter blood clot given your location of pain and the fact that you take birth control pills.  I am more concerned about a meniscus injury or sprain to your knee.  Use brace to help manage her pain and provide some stability.  I also recommend that you use Tylenol, elevation, ice for additional symptom relief.  Use prednisone to help with pain and inflammation.  You should not take NSAIDs with this medication including aspirin, ibuprofen/Advil, naproxen/Aleve.  I have also called in a muscle relaxer which should help with your knee and back pain.  Do not drive or drink alcohol while taking this as drowsiness is a common side effect.  If your symptoms are not improving quickly Please  follow-up with orthopedics as we discussed.  If anything worsens please go to the emergency room.     ED Prescriptions     Medication Sig Dispense Auth. Provider   predniSONE (DELTASONE) 20 MG tablet Take 2 tablets (40 mg total) by mouth daily with breakfast for 4 days. 8 tablet Deionte Spivack K, PA-C   tiZANidine (ZANAFLEX) 4 MG capsule Take 1 capsule (4 mg total) by mouth 3 (three) times daily. 30 capsule [place],  Cesilia Shinn K, PA-C      PDMP not reviewed this encounter.   Jeani Hawking, PA-C 09/10/20 1317

## 2020-09-10 NOTE — ED Provider Notes (Signed)
[facility]    CSN: [id number] Arrival date & time: 09/10/20  1758      History   Chief Complaint Chief Complaint  Patient presents with   Knee Pain    Left    HPI Kristen Dean is a 20 y.o. female presenting with L knee pain x4 days.  This patient was actually seen at our [facility] urgent care about 5 hours ago for the same issue, she has since forgotten that she attended that appointment.  She is currently being worked up for memory changes from her primary care, this is unchanged. At her previous visit she denied injuries or falls. Since that visit, she spoke with her mom who reminded her that she actually hit her knee while walking up the stairs the other day.  Patient did not recall this incident and did not report it at her [facility] appointment.  She again endorses several days of left knee pain, localized to the posterior/popliteal fossa with radiation into the leg.  States this feels like a pressure with aching.  Worse with flexion and ambulation.  Has not tried any medications to relieve the symptoms.  She is unable to take NSAIDs.  No history of previous surgeries.  No history of DVT or PE, she does currently take birth control. Denies any recent hospitalization, immobilization, recent travel, recent COVID-19 vaccination or diagnosis.  Denies any associated chest pain, shortness of breath, heart racing, headaches, dizziness.  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   Chest pain, unspecified 11/11/2019   GERD (gastroesophageal reflux disease) 08/08/2017   Nausea and vomiting 02/16/2017   Vitamin D deficiency 02/21/2015   Learning disability  01/27/2013    Past Surgical History:  Procedure Laterality Date   NO PAST SURGERIES      OB History     Gravida  4   Para  2   Term  2   Preterm      AB  2   Living  2      SAB  2   IAB      Ectopic      Multiple  0   Live Births  2        Obstetric Comments  Pregnancy #2 Baby Boy, Vit D Deficiency, Fetal Pyelectasis that resolved          Home Medications    Prior to Admission medications   Medication Sig Start Date End Date Taking? Authorizing Provider  alum & mag hydroxide-simeth (MAALOX/MYLANTA) 200-200-20 MG/5ML suspension Take 15-30 mLs by mouth every 6 (six) hours as needed for indigestion or heartburn. 08/23/20   Wieters, Hallie C, PA-C  dicyclomine (BENTYL) 10 MG capsule Take 1 capsule (10 mg total) by mouth 3 (three) times daily before meals. 08/04/20   Zehr, Princella PellegriniJessica D, PA-C  hydrOXYzine (ATARAX/VISTARIL) 25 MG tablet Take 1 tablet (25 mg total) by mouth every 6 (six) hours as needed for anxiety. 08/23/20   Wieters, Hallie C, PA-C  pantoprazole (PROTONIX) 20 MG tablet Take 1 tablet (20 mg total) by mouth daily. 08/12/20   Margarita Grizzleay, Alaisha, MD  predniSONE (DELTASONE) 20 MG tablet Take 2 tablets (40 mg total) by mouth  daily with breakfast for 4 days. 09/10/20 09/14/20  Raspet, Noberto Retort, PA-C  tiZANidine (ZANAFLEX) 4 MG capsule Take 1 capsule (4 mg total) by mouth 3 (three) times daily. 09/10/20   Raspet, Noberto Retort, PA-C  Vitamin D, Ergocalciferol, (DRISDOL) 1.25 MG (50000 UNIT) CAPS capsule TAKE 1 CAPSULE BY MOUTH EVERY 7 DAYS 07/06/20   Simmons-Robinson, Tawanna Cooler, MD    Family History Family History  Problem Relation Age of Onset   Diabetes Mother    Hypertension Mother    Diabetes Sister    Mental retardation Sister    Heart disease Maternal Aunt    Diabetes Maternal Aunt    Diabetes Maternal Uncle    Diabetes Maternal Grandmother    Heart disease Maternal Grandfather    Diabetes Maternal Grandfather    Colon cancer Neg Hx    Esophageal cancer Neg Hx     Pancreatic cancer Neg Hx    Stomach cancer Neg Hx     Social History Social History   Tobacco Use   Smoking status: Never   Smokeless tobacco: Never  Vaping Use   Vaping Use: Never used  Substance Use Topics   Alcohol use: Yes    Comment: occ glass of wine   Drug use: No     Allergies   Patient has no known allergies.   Review of Systems Review of Systems  Musculoskeletal:        L knee pain  All other systems reviewed and are negative.   Physical Exam Triage Vital Signs ED Triage Vitals  Enc Vitals Group     BP 09/10/20 1909 124/79     Pulse Rate 09/10/20 1909 77     Resp 09/10/20 1909 17     Temp 09/10/20 1909 98.6 F (37 C)     Temp Source 09/10/20 1909 Oral     SpO2 09/10/20 1909 97 %     Weight --      Height --      Head Circumference --      Peak Flow --      Pain Score 09/10/20 1907 7     Pain Loc --      Pain Edu? --      Excl. in GC? --    No data found.  Updated Vital Signs BP 124/79 (BP Location: Right Arm)   Pulse 77   Temp 98.6 F (37 C) (Oral)   Resp 17   LMP 08/29/2020   SpO2 97%   Visual Acuity Right Eye Distance:   Left Eye Distance:   Bilateral Distance:    Right Eye Near:   Left Eye Near:    Bilateral Near:     Physical Exam Vitals reviewed.  Constitutional:      General: She is not in acute distress.    Appearance: Normal appearance. She is not ill-appearing or diaphoretic.  HENT:     Head: Normocephalic and atraumatic.  Cardiovascular:     Rate and Rhythm: Normal rate and regular rhythm.     Heart sounds: Normal heart sounds.  Pulmonary:     Effort: Pulmonary effort is normal.     Breath sounds: Normal breath sounds. No decreased breath sounds, wheezing, rhonchi or rales.     Comments: Clear to auscultation bilaterally  Musculoskeletal:     Right knee: Normal. No swelling, deformity, effusion, erythema, ecchymosis, lacerations, bony tenderness or crepitus. Normal range of motion. No tenderness. No LCL laxity,  MCL laxity, ACL laxity or PCL laxity.  Normal alignment, normal meniscus and normal patellar mobility. Normal pulse.     Instability Tests: Anterior drawer test negative. Posterior drawer test negative. Anterior Lachman test negative. Medial McMurray test negative and lateral McMurray test negative.     Left knee: No swelling, deformity, effusion, erythema, ecchymosis, lacerations, bony tenderness or crepitus. Decreased range of motion. Tenderness present over the medial joint line. No LCL laxity, MCL laxity, ACL laxity or PCL laxity.Normal alignment, normal meniscus and normal patellar mobility. Normal pulse.     Instability Tests: Anterior drawer test negative. Posterior drawer test negative. Anterior Lachman test negative. Medial McMurray test negative and lateral McMurray test negative.     Comments: L knee: decreased ROM w flexion and extension related to pain. No joint laxity. TTP popliteal fossa without cords. Positive calf tenderness. DP 2+, cap refill <2 seconds .  Skin:    General: Skin is warm.  Neurological:     General: No focal deficit present.     Mental Status: She is alert and oriented to person, place, and time.  Psychiatric:        Mood and Affect: Mood normal.        Behavior: Behavior normal.        Thought Content: Thought content normal.        Judgment: Judgment normal.     UC Treatments / Results  Labs (all labs ordered are listed, but only abnormal results are displayed) Labs Reviewed - No data to display  EKG   Radiology No results found.  Procedures Procedures (including critical care time)  Medications Ordered in UC Medications - No data to display  Initial Impression / Assessment and Plan / UC Course  I have reviewed the triage vital signs and the nursing notes.  Pertinent labs & imaging results that were available during my care of the patient were reviewed by me and considered in my medical decision making (see chart for details).     This  patient is a very pleasant 20 y.o. year old female presenting with L knee pain x4 days following fall while walking up stairs. Medical history memory changes-currently being worked up by primary care, unchanged per mom.  This patient actually presented to our [facility] urgent care 5 hours ago, at that time she had denied trauma and so no x-rays were performed.  They were concerned for DVT and so ultrasound was ordered, this has not been performed yet.  She was prescribed prednisone burst as she is unable to take NSAIDs, and also tizanidine.  She has not started these medications yet.  She was provided with knee brace, which she has removed since her visit 5 hours ago.  X-ray today wnl.  Again stressed importance of wearing the brace, attending ultrasound appointment, and [facility] follow-up if symptoms persist. ED return precautions discussed. Patient verbalizes understanding and agreement. I also provided this patient with another copy of her AVS from her visit from 5 hours ago.   Final Clinical Impressions(s) / UC Diagnoses   Final diagnoses:  Acute pain of left knee     Discharge Instructions      -Attend ultrasound appointment -Wear knee brace, pick up the prednisone and tizanidine -If symptoms persist in 4 days, follow-up with an orthopedist. I recommend [facility] at [address], [place], [place] [address]. You can schedule an appointment by calling [phone]) or online ([email or web address]), but they also have a walk-in clinic M-F 8a-8p and Sat 10a-3p.      ED Prescriptions  None    PDMP not reviewed this encounter.   Rhys Martini, PA-C 09/10/20 1939

## 2020-09-10 NOTE — ED Triage Notes (Signed)
Pt presents with knee pain after hitting knee on door frame and tripped un stairs. States memory "has been off lately" and discussed with PCP. States was seen at [facility] earlier today but did not recall the fall until speaking to mother on the phone after visit.

## 2020-09-21 ENCOUNTER — Ambulatory Visit: Payer: Medicaid Other | Admitting: Gastroenterology

## 2020-10-09 ENCOUNTER — Encounter: Payer: Self-pay | Admitting: Family Medicine

## 2020-10-12 ENCOUNTER — Other Ambulatory Visit: Payer: Self-pay | Admitting: Family Medicine

## 2020-10-12 DIAGNOSIS — R41840 Attention and concentration deficit: Secondary | ICD-10-CM

## 2020-10-12 DIAGNOSIS — F819 Developmental disorder of scholastic skills, unspecified: Secondary | ICD-10-CM

## 2020-10-12 NOTE — Progress Notes (Signed)
Patient writes via MyChart requesting referral to [facility] for formal ADHD evaluation. Referral submitted.   Ronnald Ramp, MD [facility], PGY-3

## 2020-10-13 ENCOUNTER — Other Ambulatory Visit: Payer: Self-pay | Admitting: Family Medicine

## 2020-10-14 DIAGNOSIS — H5213 Myopia, bilateral: Secondary | ICD-10-CM | POA: Diagnosis not present

## 2020-10-20 ENCOUNTER — Ambulatory Visit: Payer: Medicaid Other | Admitting: Physician Assistant

## 2020-10-20 ENCOUNTER — Other Ambulatory Visit: Payer: Self-pay | Admitting: Family Medicine

## 2020-10-20 MED ORDER — NORGESTIM-ETH ESTRAD TRIPHASIC 0.18/0.215/0.25 MG-25 MCG PO TABS: 1.0000 | ORAL_TABLET | Freq: Every day | ORAL | 1 refills | Status: DC

## 2020-10-30 DIAGNOSIS — F909 Attention-deficit hyperactivity disorder, unspecified type: Secondary | ICD-10-CM | POA: Diagnosis not present

## 2020-10-30 DIAGNOSIS — F39 Unspecified mood [affective] disorder: Secondary | ICD-10-CM | POA: Diagnosis not present

## 2020-11-09 ENCOUNTER — Ambulatory Visit: Payer: Medicaid Other | Admitting: Physician Assistant

## 2020-11-15 DIAGNOSIS — F909 Attention-deficit hyperactivity disorder, unspecified type: Secondary | ICD-10-CM | POA: Diagnosis not present

## 2020-11-16 ENCOUNTER — Encounter: Payer: Self-pay | Admitting: Family Medicine

## 2020-11-19 DIAGNOSIS — F39 Unspecified mood [affective] disorder: Secondary | ICD-10-CM | POA: Diagnosis not present

## 2020-11-19 DIAGNOSIS — F909 Attention-deficit hyperactivity disorder, unspecified type: Secondary | ICD-10-CM | POA: Diagnosis not present

## 2020-11-30 ENCOUNTER — Other Ambulatory Visit: Payer: Self-pay

## 2020-11-30 ENCOUNTER — Ambulatory Visit (HOSPITAL_COMMUNITY)
Admission: EM | Admit: 2020-11-30 | Discharge: 2020-11-30 | Disposition: A | Discharge status: Home / Self Care | Payer: Medicaid Other | Attending: Family Medicine | Admitting: Family Medicine

## 2020-11-30 ENCOUNTER — Encounter (HOSPITAL_COMMUNITY): Payer: Self-pay | Admitting: Emergency Medicine

## 2020-11-30 DIAGNOSIS — R1084 Generalized abdominal pain: Secondary | ICD-10-CM | POA: Insufficient documentation

## 2020-11-30 DIAGNOSIS — R519 Headache, unspecified: Secondary | ICD-10-CM | POA: Insufficient documentation

## 2020-11-30 DIAGNOSIS — F909 Attention-deficit hyperactivity disorder, unspecified type: Secondary | ICD-10-CM | POA: Diagnosis not present

## 2020-11-30 LAB — RESPIRATORY PANEL BY PCR

## 2020-11-30 MED ORDER — OSELTAMIVIR PHOSPHATE 75 MG PO CAPS: 75.0000 mg | ORAL_CAPSULE | Freq: Two times a day (BID) | ORAL | 0 refills | Status: DC

## 2020-11-30 MED ORDER — ONDANSETRON 4 MG PO TBDP: 4.0000 mg | ORAL_TABLET | Freq: Three times a day (TID) | ORAL | 0 refills | Status: DC | PRN

## 2020-11-30 NOTE — ED Triage Notes (Signed)
Pt is present today with HA and abdominal pain. Pt sx started today

## 2020-12-04 NOTE — ED Provider Notes (Signed)
[facility]    CSN: [id number] Arrival date & time: 11/30/20  1900      History   Chief Complaint Chief Complaint  Patient presents with   Abdominal Pain   Headache    HPI Kristen Dean is a 20 y.o. female.   Presenting today with 1 day history of headache, generalized abdominal pain.  Denies fever, chills, congestion, sore throat, cough, vomiting, diarrhea.  Not trying anything over-the-counter for symptoms.  Both of her sons are sick with upper respiratory symptoms.  No new foods or medications recently.   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   Chest pain, unspecified 11/11/2019   GERD (gastroesophageal reflux disease) 08/08/2017   Nausea and vomiting 02/16/2017   Vitamin D deficiency 02/21/2015   Learning disability 01/27/2013    Past Surgical History:  Procedure Laterality Date   NO PAST SURGERIES      OB History     Gravida  4   Para  2   Term  2   Preterm      AB  2   Living  2      SAB  2   IAB      Ectopic      Multiple  0   Live Births  2        Obstetric Comments  Pregnancy #2 Baby Boy, Vit D Deficiency, Fetal Pyelectasis that resolved          Home Medications    Prior to Admission medications   Medication Sig Start Date End Date Taking? Authorizing Provider  ondansetron (ZOFRAN-ODT) 4 MG disintegrating tablet Take 1 tablet (4 mg total) by mouth every 8 (eight) hours as needed for nausea or vomiting. 11/30/20  Yes Particia Nearing, PA-C  oseltamivir (TAMIFLU) 75 MG capsule Take 1 capsule (75 mg total) by mouth every 12 (twelve) hours. 11/30/20  Yes Particia Nearing, PA-C  alum &  mag hydroxide-simeth (MAALOX/MYLANTA) 200-200-20 MG/5ML suspension Take 15-30 mLs by mouth every 6 (six) hours as needed for indigestion or heartburn. 08/23/20   Wieters, Hallie C, PA-C  dicyclomine (BENTYL) 10 MG capsule Take 1 capsule (10 mg total) by mouth 3 (three) times daily before meals. 08/04/20   Zehr, Princella Pellegrini, PA-C  hydrOXYzine (ATARAX/VISTARIL) 25 MG tablet Take 1 tablet (25 mg total) by mouth every 6 (six) hours as needed for anxiety. 08/23/20   Wieters, Hallie C, PA-C  Norgestimate-Ethinyl Estradiol Triphasic (TRI-LO-SPRINTEC) 0.18/0.215/0.25 MG-25 MCG tab Take 1 tablet by mouth daily. 10/20/20   Simmons-Robinson, Makiera, MD  pantoprazole (PROTONIX) 20 MG tablet Take 1 tablet (20 mg total) by mouth daily. 08/12/20   Margarita Grizzle, MD  tiZANidine (ZANAFLEX) 4 MG capsule Take 1 capsule (4 mg total) by mouth 3 (three) times daily. 09/10/20   Raspet, Noberto Retort, PA-C  Vitamin D, Ergocalciferol, (DRISDOL) 1.25 MG (50000 UNIT) CAPS capsule TAKE 1 CAPSULE BY MOUTH EVERY 7 DAYS 07/06/20   Simmons-Robinson, Tawanna Cooler, MD    Family History Family History  Problem Relation Age of Onset   Diabetes Mother    Hypertension Mother    Diabetes Sister    Mental retardation Sister    Heart disease  Maternal Aunt    Diabetes Maternal Aunt    Diabetes Maternal Uncle    Diabetes Maternal Grandmother    Heart disease Maternal Grandfather    Diabetes Maternal Grandfather    Colon cancer Neg Hx    Esophageal cancer Neg Hx    Pancreatic cancer Neg Hx    Stomach cancer Neg Hx     Social History Social History   Tobacco Use   Smoking status: Never   Smokeless tobacco: Never  Vaping Use   Vaping Use: Never used  Substance Use Topics   Alcohol use: Yes    Comment: occ glass of wine   Drug use: No     Allergies   Patient has no known allergies.   Review of Systems Review of Systems Per HPI  Physical Exam Triage Vital Signs ED Triage Vitals  Enc Vitals Group     BP 11/30/20 2002 121/79      Pulse Rate 11/30/20 2002 83     Resp 11/30/20 2002 18     Temp 11/30/20 2002 98.6 F (37 C)     Temp Source 11/30/20 2002 Oral     SpO2 11/30/20 2002 96 %     Weight --      Height --      Head Circumference --      Peak Flow --      Pain Score 11/30/20 2000 5     Pain Loc --      Pain Edu? --      Excl. in GC? --    No data found.  Updated Vital Signs BP 121/79   Pulse 83   Temp 98.6 F (37 C) (Oral)   Resp 18   SpO2 96%   Visual Acuity Right Eye Distance:   Left Eye Distance:   Bilateral Distance:    Right Eye Near:   Left Eye Near:    Bilateral Near:     Physical Exam Vitals and nursing note reviewed.  Constitutional:      Appearance: Normal appearance. She is not ill-appearing.  HENT:     Head: Atraumatic.     Right Ear: Tympanic membrane normal.     Left Ear: Tympanic membrane normal.     Nose: Nose normal.     Mouth/Throat:     Mouth: Mucous membranes are moist.  Eyes:     Extraocular Movements: Extraocular movements intact.     Conjunctiva/sclera: Conjunctivae normal.  Cardiovascular:     Rate and Rhythm: Normal rate and regular rhythm.     Heart sounds: Normal heart sounds.  Pulmonary:     Effort: Pulmonary effort is normal.     Breath sounds: Normal breath sounds.  Abdominal:     General: Bowel sounds are normal. There is no distension.     Palpations: Abdomen is soft.     Tenderness: There is no abdominal tenderness. There is no guarding.  Musculoskeletal:        General: Normal range of motion.     Cervical back: Normal range of motion and neck supple.  Skin:    General: Skin is warm and dry.  Neurological:     Mental Status: She is alert and oriented to person, place, and time.     Cranial Nerves: No cranial nerve deficit.     Motor: No weakness.     Gait: Gait normal.  Psychiatric:        Mood and Affect: Mood normal.  Thought Content: Thought content normal.        Judgment: Judgment normal.     UC Treatments / Results   Labs (all labs ordered are listed, but only abnormal results are displayed) Labs Reviewed  RESPIRATORY PANEL BY PCR    EKG   Radiology No results found.  Procedures Procedures (including critical care time)  Medications Ordered in UC Medications - No data to display  Initial Impression / Assessment and Plan / UC Course  I have reviewed the triage vital signs and the nursing notes.  Pertinent labs & imaging results that were available during my care of the patient were reviewed by me and considered in my medical decision making (see chart for details).     Vitals and exam very reassuring today, possibly an early presentation of viral illness such as influenza.  Respiratory panel pending, will proactively treat with Tamiflu while awaiting results given community exposures.  Discussed Zofran for abdominal pain, nausea and supportive home care and return precautions.  Final Clinical Impressions(s) / UC Diagnoses   Final diagnoses:  Generalized abdominal pain  Bad headache   Discharge Instructions   None    ED Prescriptions     Medication Sig Dispense Auth. Provider   oseltamivir (TAMIFLU) 75 MG capsule Take 1 capsule (75 mg total) by mouth every 12 (twelve) hours. 10 capsule Particia Nearing, PA-C   ondansetron (ZOFRAN-ODT) 4 MG disintegrating tablet Take 1 tablet (4 mg total) by mouth every 8 (eight) hours as needed for nausea or vomiting. 20 tablet Particia Nearing, [place]      PDMP not reviewed this encounter.   Particia Nearing, [place] 12/04/20 1404

## 2020-12-08 DIAGNOSIS — F909 Attention-deficit hyperactivity disorder, unspecified type: Secondary | ICD-10-CM | POA: Diagnosis not present

## 2020-12-27 IMAGING — US US MFM OB DETAIL+14 WK
1 series · 13 of 28 positions shown · non-contrast
Comparison: none

[Series 1: us mfm ob detail+14 wk · 138 acquisitions, 13 frames shown]
[im 6/138]
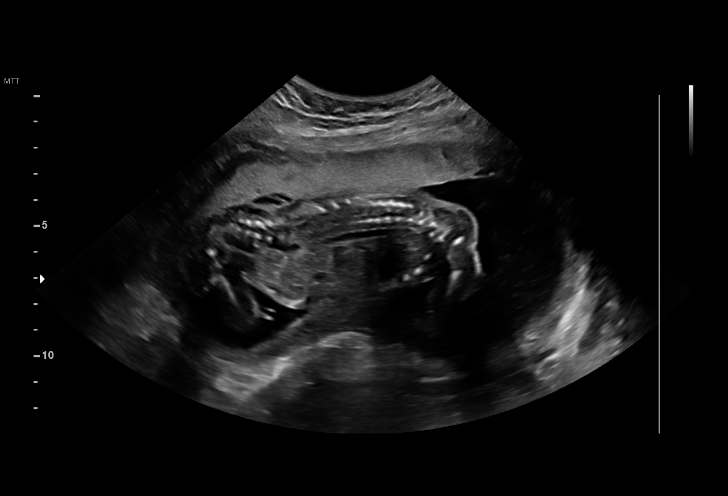
[im 16/138]
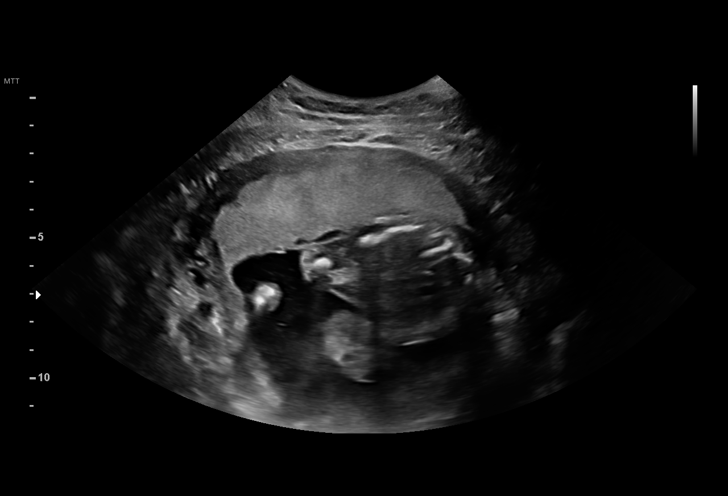
[im 26/138]
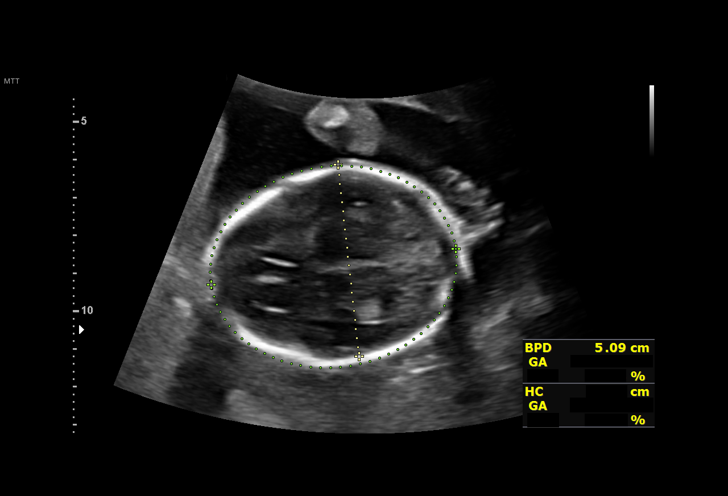
[im 36/138]
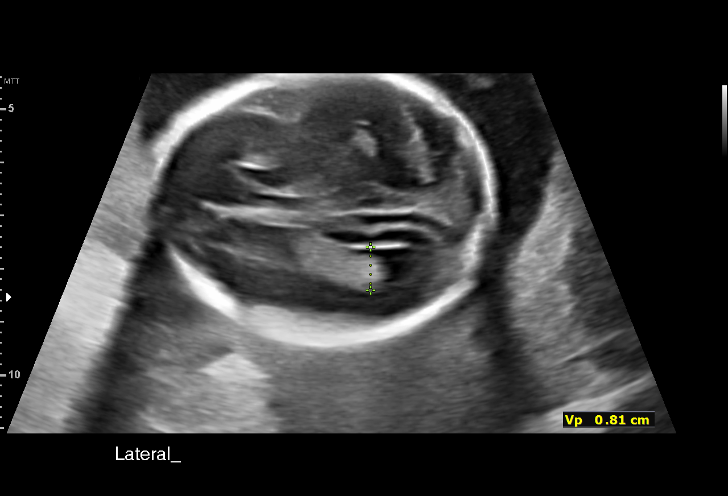
[im 46/138]
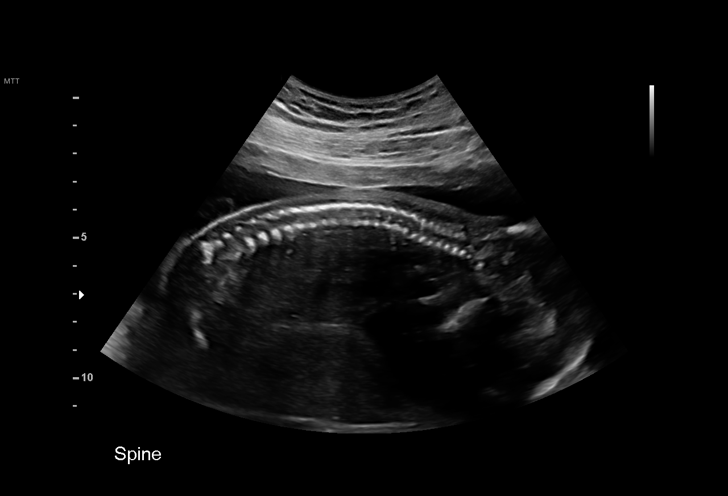
[im 56/138]
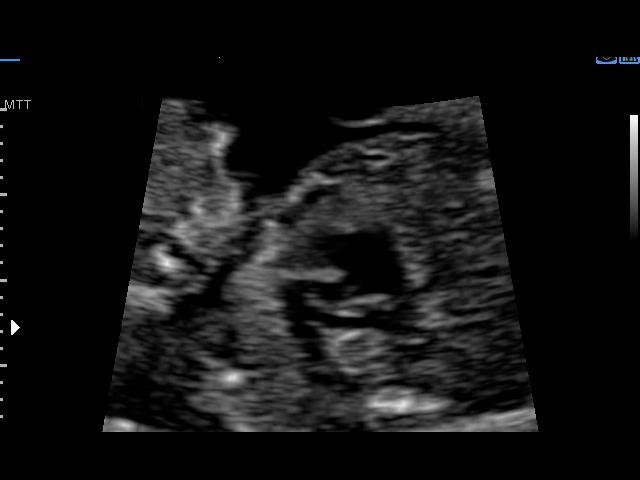
[im 72/138]
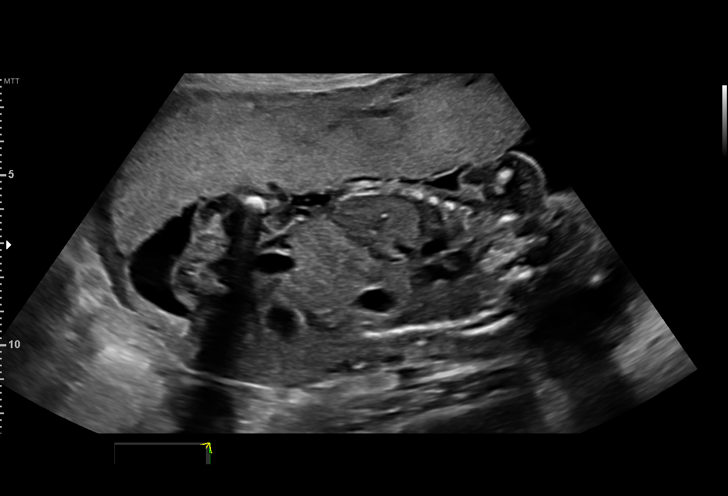
[im 82/138]
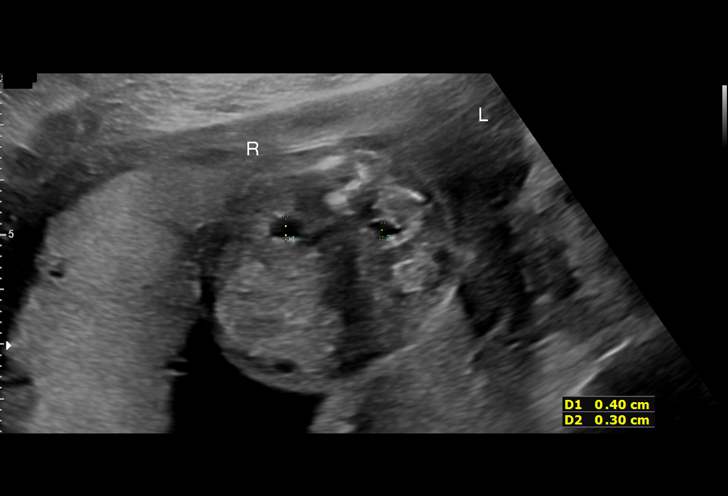
[im 92/138]
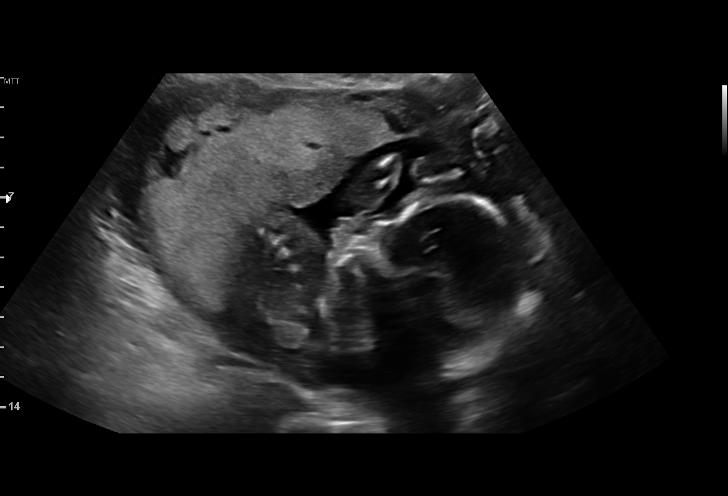
[im 102/138]
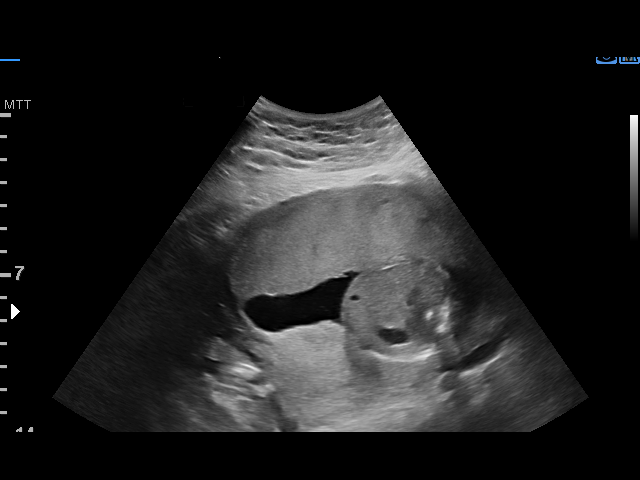
[im 112/138]
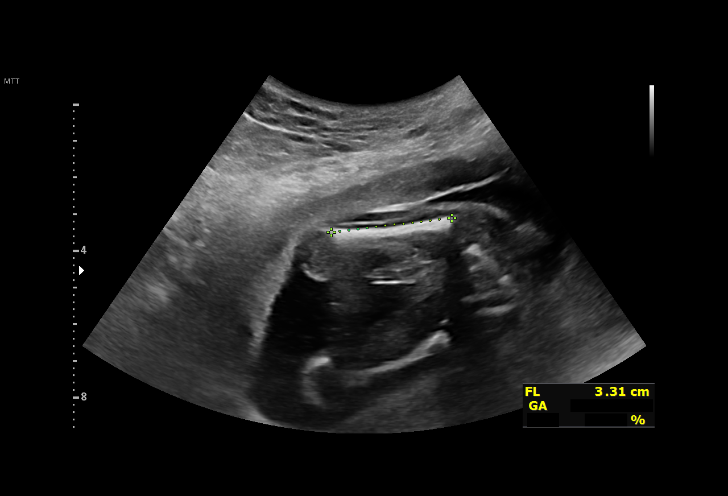
[im 122/138]
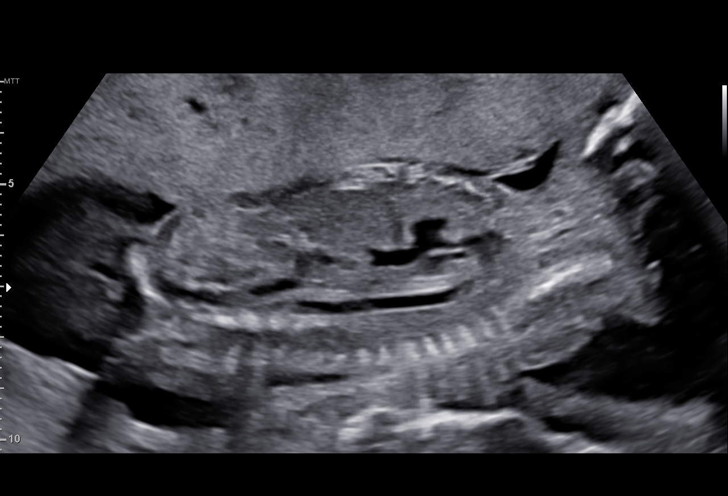
[im 132/138]
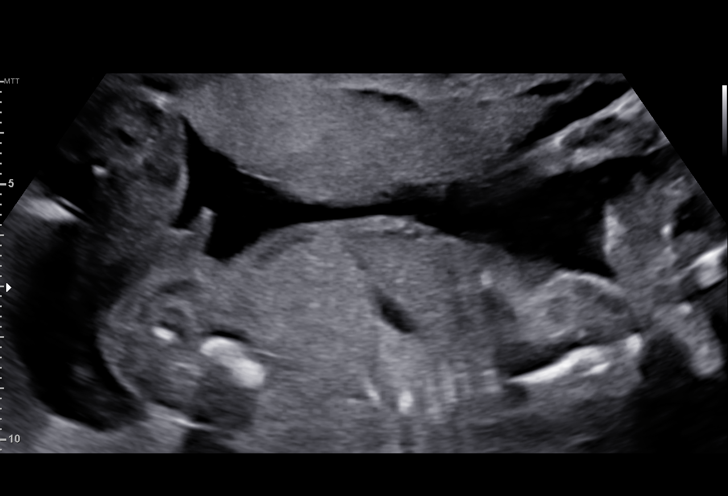

[13 of 28 positions shown; findings below may reference images not displayed]

Indications

 Obesity complicating pregnancy, second
 trimester (BMI 31)
 20 weeks gestation of pregnancy
 Encounter for antenatal screening for
 malformations
Fetal Evaluation

 Num Of Fetuses:         1
 Fetal Heart Rate(bpm):  147
 Cardiac Activity:       Observed
 Presentation:           Cephalic
 Placenta:               Anterior
 P. Cord Insertion:      Visualized, central

 Amniotic Fluid
 AFI FV:      Within normal limits

                             Largest Pocket(cm)

Biometry

 BPD:      51.1  mm     G. Age:  21w 3d         74  %    CI:        75.71   %    70 - 86
                                                         FL/HC:      17.7   %    15.9 -
 HC:      186.2  mm     G. Age:  21w 0d         45  %    HC/AC:      1.18        1.06 -
 AC:      157.6  mm     G. Age:  20w 6d         45  %    FL/BPD:     64.4   %
 FL:       32.9  mm     G. Age:  20w 2d         22  %    FL/AC:      20.9   %    20 - 24
 HUM:      30.1  mm     G. Age:  20w 0d         22  %
 CER:      21.9  mm     G. Age:  20w 4d         54  %
 NFT:       4.1  mm
 LV:        8.4  mm
 CM:        5.4  mm

 Est. FW:     370  gm    0 lb 13 oz      35  %
OB History

 Blood Type:   A+
 Gravidity:    4         Term:   1        Prem:   0        SAB:   2
 TOP:          0       Ectopic:  0        Living: 1
Gestational Age

 LMP:           20w 6d        Date:  06/06/19                 EDD:   03/12/20
 U/S Today:     20w 6d                                        EDD:   03/12/20
 Best:          20w 6d     Det. By:  LMP  (06/06/19)          EDD:   03/12/20
Anatomy

 Cranium:               Appears normal         Aortic Arch:            Not well visualized
 Cavum:                 Appears normal         Ductal Arch:            Appears normal
 Ventricles:            Appears normal         Diaphragm:              Appears normal
 Choroid Plexus:        Appears normal         Stomach:                Appears normal, left
                                                                       sided
 Cerebellum:            Appears normal         Abdomen:                Appears normal
 Posterior Fossa:       Appears normal         Abdominal Wall:         Appears nml (cord
                                                                       insert, abd wall)
 Nuchal Fold:           Appears normal         Cord Vessels:           Appears normal (3
                                                                       vessel cord)
 Face:                  Orbits nl; profile not Kidneys:                Appear normal
                        well visualized
 Lips:                  Appears normal         Bladder:                Appears normal
 Thoracic:              Appears normal         Spine:                  Appears normal
 Heart:                 Appears normal         Upper Extremities:      Visualized
                        (4CH, axis, and
                        situs)
 RVOT:                  Appears normal         Lower Extremities:      Appears normal
 LVOT:                  Appears normal

 Other:  Fetus appears to be a male. Hands not well visualized. Technically
         difficult due to fetal position.
Cervix Uterus Adnexa

 Cervix
 Length:           2.97  cm.
 Normal appearance by transabdominal scan.

 Uterus
 No abnormality visualized.

 Right Ovary
 Within normal limits. No adnexal mass visualized.

 Left Ovary
 Within normal limits. No adnexal mass visualized.
 Cul De Sac
 No free fluid seen.

 Adnexa
 No abnormality visualized.
Comments

 This patient was seen for a detailed fetal anatomy scan due
 to maternal obesity.
 She denies any significant past medical history and denies
 any problems in her current pregnancy.
 She has declined all screening tests for fetal aneuploidy in
 her current pregnancy.
 She was informed that the fetal growth and amniotic fluid
 level were appropriate for her gestational age.
 There were no obvious fetal anomalies noted on today's
 ultrasound exam.  However, the views of the fetal anatomy
 were limited today due to the fetal position.
 The patient was informed that anomalies may be missed due
 to technical limitations. If the fetus is in a suboptimal position
 or maternal habitus is increased, visualization of the fetus in
 the maternal uterus may be impaired.
 A follow-up exam was scheduled in 4 weeks to complete the
 views of the fetal anatomy.

## 2020-12-29 ENCOUNTER — Ambulatory Visit: Payer: Medicaid Other

## 2020-12-29 DIAGNOSIS — F909 Attention-deficit hyperactivity disorder, unspecified type: Secondary | ICD-10-CM | POA: Diagnosis not present

## 2021-01-06 DIAGNOSIS — F909 Attention-deficit hyperactivity disorder, unspecified type: Secondary | ICD-10-CM | POA: Diagnosis not present

## 2021-01-10 ENCOUNTER — Other Ambulatory Visit: Payer: Self-pay

## 2021-01-10 ENCOUNTER — Ambulatory Visit (INDEPENDENT_AMBULATORY_CARE_PROVIDER_SITE_OTHER): Payer: Medicaid Other

## 2021-01-10 DIAGNOSIS — Z23 Encounter for immunization: Secondary | ICD-10-CM

## 2021-01-12 NOTE — Progress Notes (Signed)
Patient presents to nurse clinic for flu vaccination. Administered in RD, site unremarkable, tolerated injection well.   Faaris Arizpe C Ludivina Guymon, RN  

## 2021-01-13 DIAGNOSIS — F909 Attention-deficit hyperactivity disorder, unspecified type: Secondary | ICD-10-CM | POA: Diagnosis not present

## 2021-01-24 DIAGNOSIS — F209 Schizophrenia, unspecified: Secondary | ICD-10-CM | POA: Diagnosis not present

## 2021-01-24 DIAGNOSIS — F909 Attention-deficit hyperactivity disorder, unspecified type: Secondary | ICD-10-CM | POA: Diagnosis not present

## 2021-01-26 DIAGNOSIS — F39 Unspecified mood [affective] disorder: Secondary | ICD-10-CM | POA: Diagnosis not present

## 2021-01-26 DIAGNOSIS — F909 Attention-deficit hyperactivity disorder, unspecified type: Secondary | ICD-10-CM | POA: Diagnosis not present

## 2021-01-31 IMAGING — US US MFM OB FOLLOW-UP
1 series · 14 of 28 positions shown · non-contrast
Comparison: none

[Series 1: us mfm ob follow-up · 82 acquisitions, 14 frames shown]
[im 4/82]
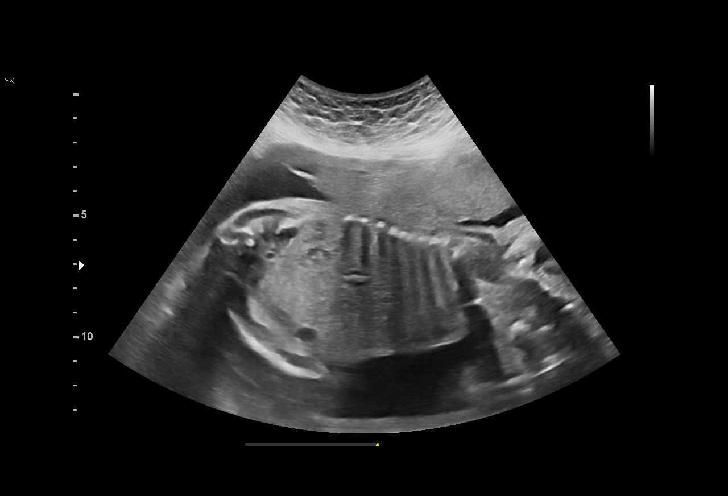
[im 10/82]
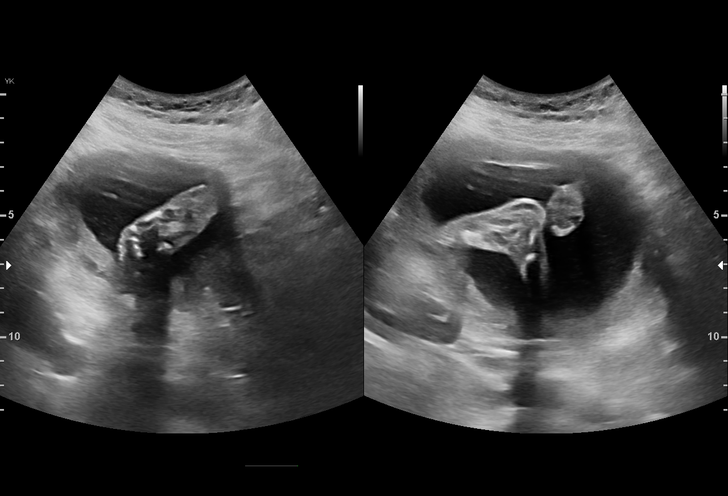
[im 16/82]
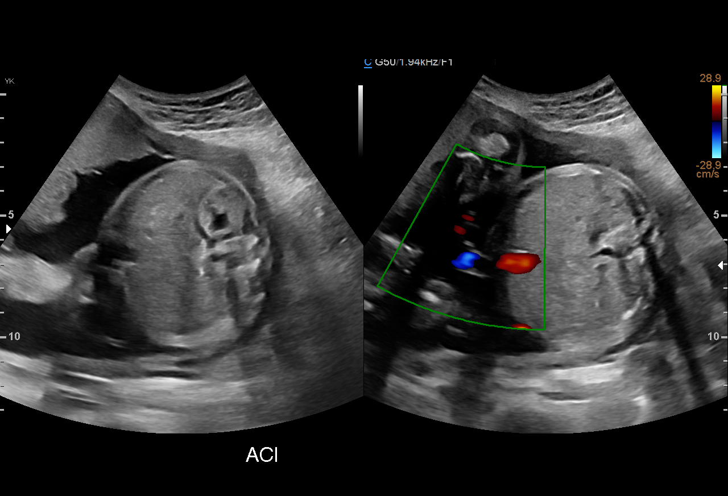
[im 22/82]
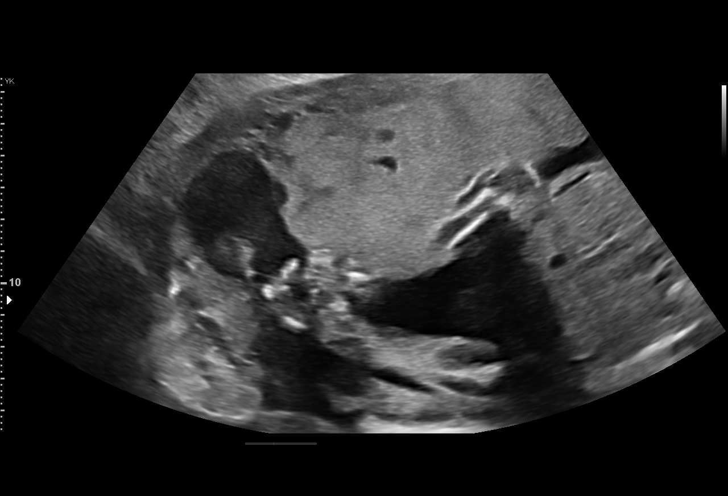
[im 28/82]
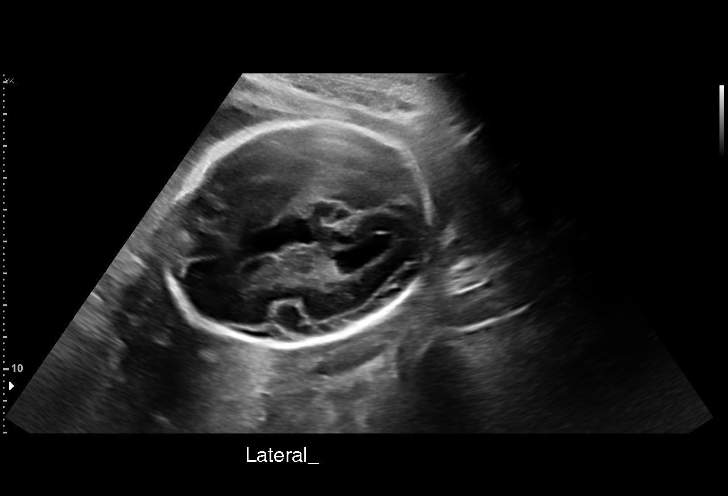
[im 34/82]
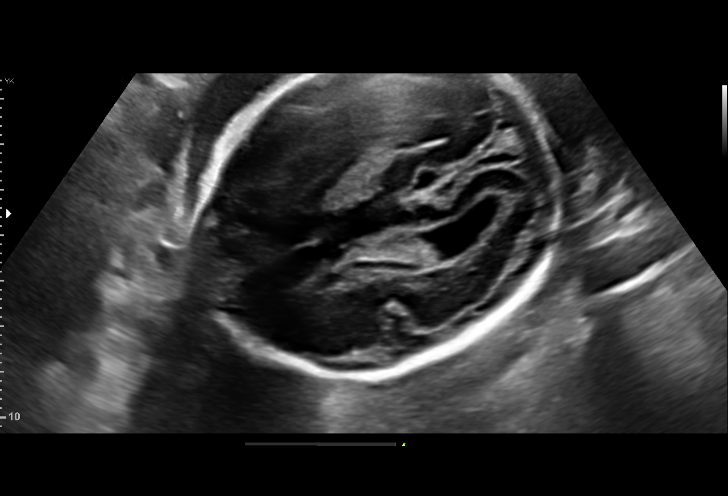
[im 40/82]
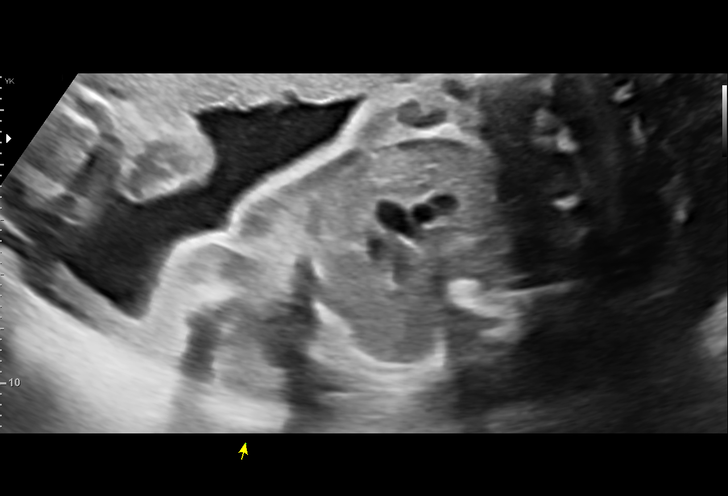
[im 46/82]
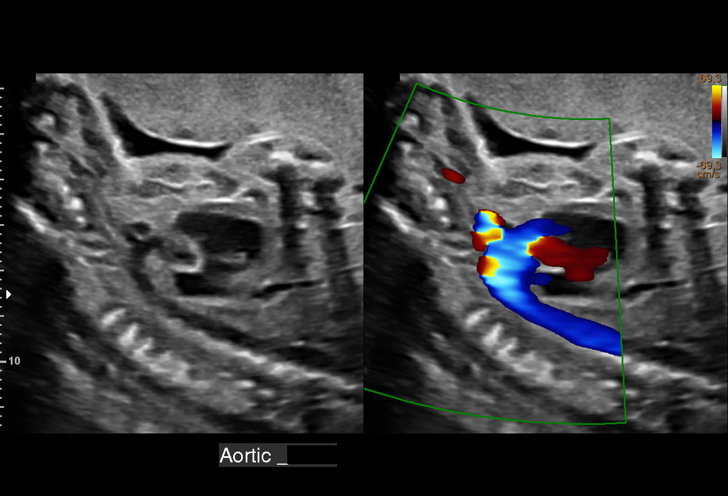
[im 52/82]
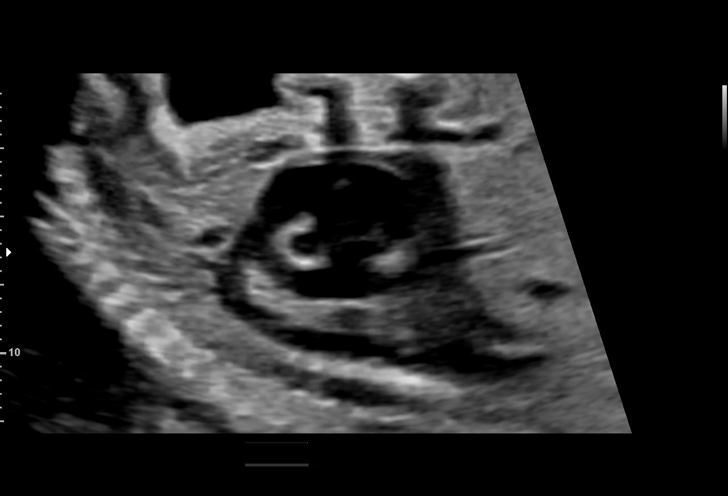
[im 58/82]
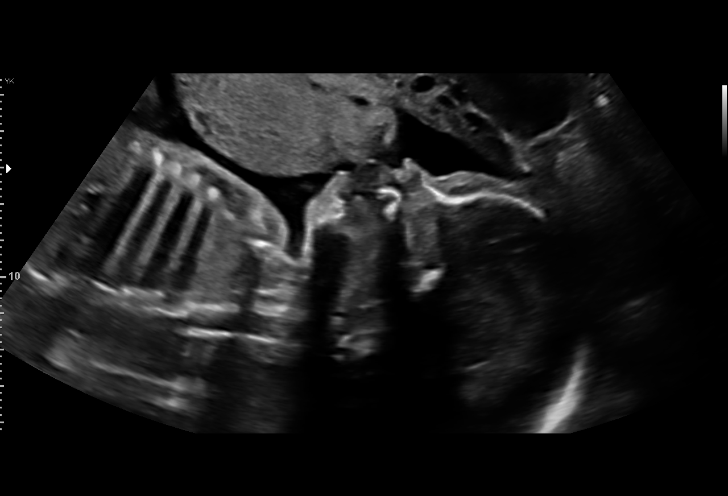
[im 64/82]
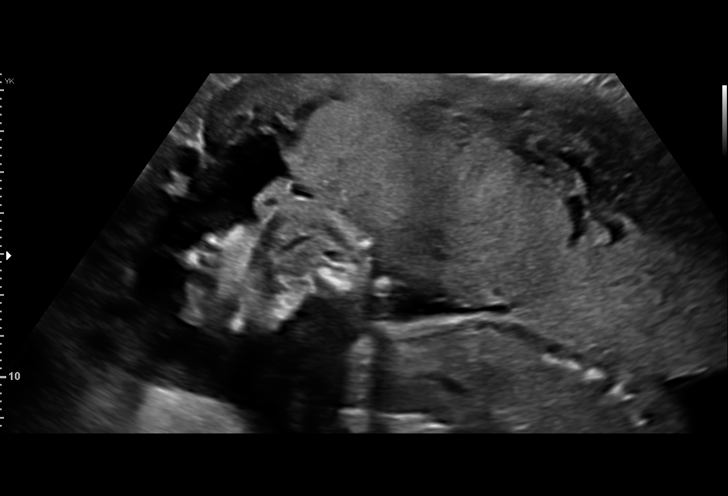
[im 70/82]
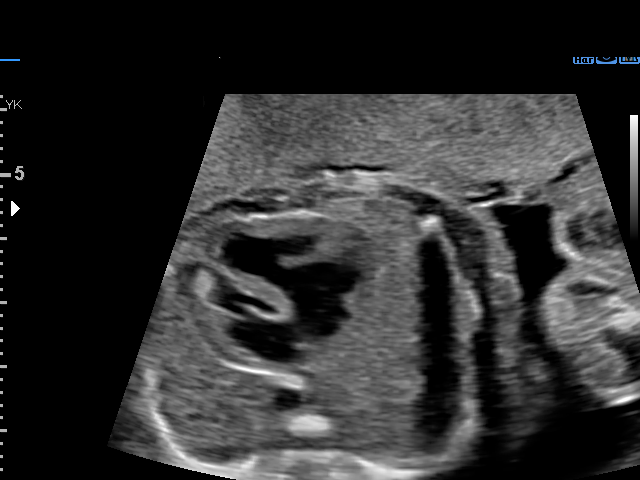
[im 76/82]
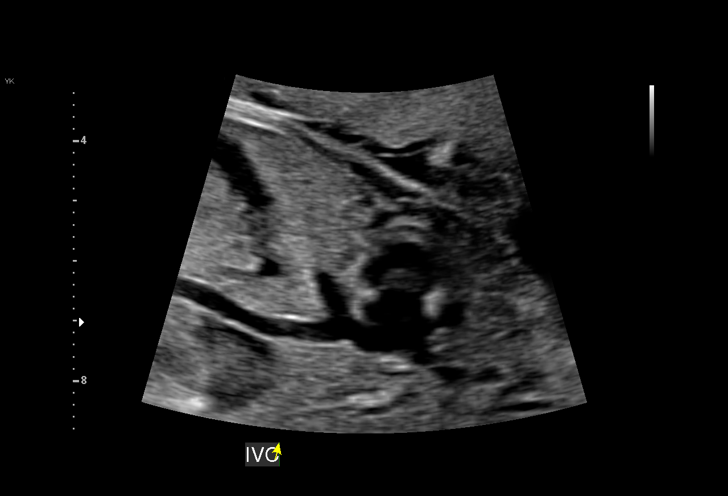
[im 82/82]
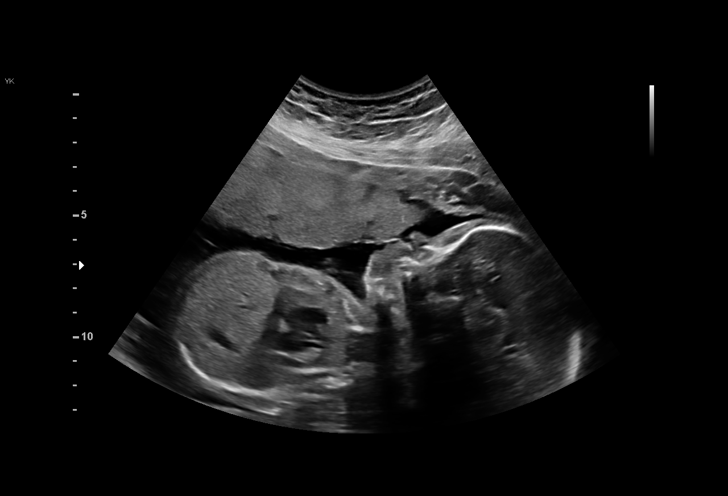

[14 of 28 positions shown; findings below may reference images not displayed]

Indications

 Antenatal follow-up for nonvisualized fetal
 anatomy
 25 weeks gestation of pregnancy
 Obesity complicating pregnancy, second
 trimester (BMI 31)
 Encounter for antenatal screening for
 malformations
Fetal Evaluation

 Num Of Fetuses:         1
 Fetal Heart Rate(bpm):  136
 Cardiac Activity:       Observed
 Presentation:           Cephalic
 Placenta:               Anterior
 P. Cord Insertion:      Previously Visualized

 Amniotic Fluid
 AFI FV:      Within normal limits

                             Largest Pocket(cm)

Biometry

 BPD:        68  mm     G. Age:  27w 3d         87  %    CI:        71.44   %    70 - 86
                                                         FL/HC:      17.6   %    18.6 -
 HC:      256.2  mm     G. Age:  27w 6d         88  %    HC/AC:      1.08        1.04 -
 AC:      237.2  mm     G. Age:  28w 0d         94  %    FL/BPD:     66.2   %    71 - 87
 FL:         45  mm     G. Age:  24w 6d         12  %    FL/AC:      19.0   %    20 - 24
 Est. FW:     997  gm      2 lb 3 oz     82  %
OB History

 Blood Type:   A+
 Gravidity:    4         Term:   1        Prem:   0        SAB:   2
 TOP:          0       Ectopic:  0        Living: 1
Gestational Age

 LMP:           25w 6d        Date:  06/06/19                 EDD:   03/12/20
 U/S Today:     27w 0d                                        EDD:   03/04/20
 Best:          25w 6d     Det. By:  LMP  (06/06/19)          EDD:   03/12/20
Anatomy

 Cranium:               Appears normal         Aortic Arch:            Appears normal
 Cavum:                 Appears normal         Ductal Arch:            Appears normal
 Ventricles:            Appears normal         Diaphragm:              Previously seen
 Choroid Plexus:        Appears normal         Stomach:                Appears normal, left
                                                                       sided
 Cerebellum:            Appears normal         Abdomen:                Appears normal
 Posterior Fossa:       Appears normal         Abdominal Wall:         Appears nml (cord
                                                                       insert, abd wall)
 Nuchal Fold:           Previously seen        Cord Vessels:           Appears normal (3
                                                                       vessel cord)
 Face:                  Appears normal         Kidneys:                Appear normal
                        (orbits and profile)
 Lips:                  Appears normal         Bladder:                Appears normal
 Thoracic:              Appears normal         Spine:                  Previously seen
 Heart:                 Appears normal         Upper Extremities:      Appears normal
                        (4CH, axis, and
                        situs)
 RVOT:                  Appears normal         Lower Extremities:      Previously seen
 LVOT:                  Appears normal

 Other:  Fetus appears to be a male. Hands and feet visualized. Technically
         difficult due to fetal position.
Cervix Uterus Adnexa

 Cervix
 Length:           3.24  cm.
 Normal appearance by transabdominal scan.

 Uterus
 No abnormality visualized.
Impression

 Patient returned for completion of fetal anatomy .Fetal growth
 is appropriate for gestational age .Amniotic fluid is normal
 and good fetal activity is seen .Fetal anatomical survey was
 completed and appears normal.

 We reassured the patient of the findings. She had opted not
 to screen for fetal aneuploidies.
Recommendations

 Follow-up scans as clinically indicated.
                 Oswaldo, Juanciito

## 2021-02-14 ENCOUNTER — Other Ambulatory Visit: Payer: Self-pay

## 2021-02-14 ENCOUNTER — Encounter (HOSPITAL_COMMUNITY): Payer: Self-pay | Admitting: Emergency Medicine

## 2021-02-14 ENCOUNTER — Ambulatory Visit (HOSPITAL_COMMUNITY)
Admission: EM | Admit: 2021-02-14 | Discharge: 2021-02-14 | Disposition: A | Discharge status: Home / Self Care | Payer: Medicaid Other | Attending: Physician Assistant | Admitting: Physician Assistant

## 2021-02-14 DIAGNOSIS — J020 Streptococcal pharyngitis: Secondary | ICD-10-CM

## 2021-02-14 LAB — POCT RAPID STREP A, ED / UC: Streptococcus, Group A Screen (Direct): POSITIVE — AB

## 2021-02-14 MED ORDER — AMOXICILLIN 500 MG PO TABS: 500.0000 mg | ORAL_TABLET | Freq: Two times a day (BID) | ORAL | 0 refills | Status: AC

## 2021-02-14 NOTE — ED Triage Notes (Signed)
Pt c/o sore throat and white in back of throat that got worse past 3 days.

## 2021-02-14 NOTE — Discharge Instructions (Addendum)
Take antibiotic as needed Can use chloraseptic spray as needed  Recommend Ibuprofen as needed

## 2021-02-14 NOTE — ED Provider Notes (Signed)
[place]    CSN: [id number] Arrival date & time: 02/14/21  1756      History   Chief Complaint Chief Complaint  Patient presents with   Sore Throat    HPI Kristen Dean is a 21 y.o. female.   Pt complains of sore throat that started three days ago.  Pt reports pain is worse with swallowing.  Denies congestion, cough, fever.  She has taken tylenol with some improvement.    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   Chest pain, unspecified 11/11/2019   GERD (gastroesophageal reflux disease) 08/08/2017   Nausea and vomiting 02/16/2017   Vitamin D deficiency 02/21/2015   Learning disability 01/27/2013    Past Surgical History:  Procedure Laterality Date   NO PAST SURGERIES      OB History     Gravida  4   Para  2   Term  2   Preterm      AB  2   Living  2      SAB  2   IAB      Ectopic      Multiple  0   Live Births  2        Obstetric Comments  Pregnancy #2 Baby Boy, Vit D Deficiency, Fetal Pyelectasis that resolved          Home Medications    Prior to Admission medications   Medication Sig Start Date End Date Taking? Authorizing Provider  amoxicillin (AMOXIL) 500 MG tablet Take 1 tablet (500 mg total) by mouth 2 (two) times daily for 10 days. 02/14/21 02/24/21 Yes Ward, Lenise Arena, PA-C  alum & mag hydroxide-simeth (MAALOX/MYLANTA) 200-200-20 MG/5ML suspension Take 15-30 mLs by mouth every 6 (six) hours as needed for indigestion or heartburn. 08/23/20   Wieters, Hallie C, PA-C  dicyclomine (BENTYL) 10 MG capsule Take 1 capsule (10 mg total) by mouth 3 (three) times daily before meals. 08/04/20   Zehr, Laban Emperor,  PA-C  hydrOXYzine (ATARAX/VISTARIL) 25 MG tablet Take 1 tablet (25 mg total) by mouth every 6 (six) hours as needed for anxiety. 08/23/20   Wieters, Hallie C, PA-C  Norgestimate-Ethinyl Estradiol Triphasic (TRI-LO-SPRINTEC) 0.18/0.215/0.25 MG-25 MCG tab Take 1 tablet by mouth daily. 10/20/20   Simmons-Robinson, Makiera, MD  ondansetron (ZOFRAN-ODT) 4 MG disintegrating tablet Take 1 tablet (4 mg total) by mouth every 8 (eight) hours as needed for nausea or vomiting. 11/30/20   Volney American, PA-C  oseltamivir (TAMIFLU) 75 MG capsule Take 1 capsule (75 mg total) by mouth every 12 (twelve) hours. 11/30/20   Volney American, PA-C  pantoprazole (PROTONIX) 20 MG tablet Take 1 tablet (20 mg total) by mouth daily. 08/12/20   Pattricia Boss, MD  tiZANidine (ZANAFLEX) 4 MG capsule Take 1 capsule (4 mg total) by mouth 3 (three) times daily. 09/10/20   Raspet, Derry Skill, PA-C  Vitamin D, Ergocalciferol, (DRISDOL) 1.25 MG (50000 UNIT) CAPS capsule TAKE 1 CAPSULE BY MOUTH EVERY 7 DAYS 07/06/20   Simmons-Robinson, Riki Sheer, MD    Family History Family History  Problem Relation Age of Onset   Diabetes Mother    Hypertension Mother    Diabetes Sister  Mental retardation Sister    Heart disease Maternal Aunt    Diabetes Maternal Aunt    Diabetes Maternal Uncle    Diabetes Maternal Grandmother    Heart disease Maternal Grandfather    Diabetes Maternal Grandfather    Colon cancer Neg Hx    Esophageal cancer Neg Hx    Pancreatic cancer Neg Hx    Stomach cancer Neg Hx     Social History Social History   Tobacco Use   Smoking status: Never   Smokeless tobacco: Never  Vaping Use   Vaping Use: Never used  Substance Use Topics   Alcohol use: Yes    Comment: occ glass of wine   Drug use: No     Allergies   Patient has no known allergies.   Review of Systems Review of Systems  Constitutional:  Negative for chills and fever.  HENT:  Positive for sore throat. Negative for congestion and  ear pain.   Eyes:  Negative for pain and visual disturbance.  Respiratory:  Negative for cough and shortness of breath.   Cardiovascular:  Negative for chest pain and palpitations.  Gastrointestinal:  Negative for abdominal pain and vomiting.  Genitourinary:  Negative for dysuria and hematuria.  Musculoskeletal:  Negative for arthralgias and back pain.  Skin:  Negative for color change and rash.  Neurological:  Negative for seizures and syncope.  All other systems reviewed and are negative.   Physical Exam Triage Vital Signs ED Triage Vitals  Enc Vitals Group     BP 02/14/21 1950 114/77     Pulse Rate 02/14/21 1950 (!) 106     Resp 02/14/21 1950 17     Temp 02/14/21 1950 99.9 F (37.7 C)     Temp Source 02/14/21 1950 Oral     SpO2 02/14/21 1950 98 %     Weight --      Height --      Head Circumference --      Peak Flow --      Pain Score 02/14/21 1949 9     Pain Loc --      Pain Edu? --      Excl. in GC? --    No data found.  Updated Vital Signs BP 114/77 (BP Location: Right Arm)    Pulse (!) 106    Temp 99.9 F (37.7 C) (Oral)    Resp 17    LMP 02/13/2021    SpO2 98%   Visual Acuity Right Eye Distance:   Left Eye Distance:   Bilateral Distance:    Right Eye Near:   Left Eye Near:    Bilateral Near:     Physical Exam Vitals and nursing note reviewed.  Constitutional:      General: She is not in acute distress.    Appearance: She is well-developed.  HENT:     Head: Normocephalic and atraumatic.     Mouth/Throat:     Pharynx: Pharyngeal swelling and posterior oropharyngeal erythema present.     Tonsils: Tonsillar exudate present.  Eyes:     Conjunctiva/sclera: Conjunctivae normal.  Cardiovascular:     Rate and Rhythm: Normal rate and regular rhythm.     Heart sounds: No murmur heard. Pulmonary:     Effort: Pulmonary effort is normal. No respiratory distress.     Breath sounds: Normal breath sounds.  Abdominal:     Palpations: Abdomen is soft.      Tenderness: There is no abdominal tenderness.  Musculoskeletal:  General: No swelling.     Cervical back: Neck supple.  Skin:    General: Skin is warm and dry.     Capillary Refill: Capillary refill takes less than 2 seconds.  Neurological:     Mental Status: She is alert.  Psychiatric:        Mood and Affect: Mood normal.     UC Treatments / Results  Labs (all labs ordered are listed, but only abnormal results are displayed) Labs Reviewed  POCT RAPID STREP A, ED / UC    EKG   Radiology No results found.  Procedures Procedures (including critical care time)  Medications Ordered in UC Medications - No data to display  Initial Impression / Assessment and Plan / UC Course  I have reviewed the triage vital signs and the nursing notes.  Pertinent labs & imaging results that were available during my care of the patient were reviewed by me and considered in my medical decision making (see chart for details).     Strep throat, antibiotic prescribed.  Supportive care discussed.  Return precautions discussed.  Final Clinical Impressions(s) / UC Diagnoses   Final diagnoses:  Streptococcal sore throat     Discharge Instructions      Take antibiotic as needed Can use chloraseptic spray as needed  Recommend Ibuprofen as needed    ED Prescriptions     Medication Sig Dispense Auth. Provider   amoxicillin (AMOXIL) 500 MG tablet Take 1 tablet (500 mg total) by mouth 2 (two) times daily for 10 days. 20 tablet Ward, Lenise Arena, PA-C      PDMP not reviewed this encounter.   Ward, Lenise Arena, PA-C 02/14/21 2011

## 2021-02-25 DIAGNOSIS — F909 Attention-deficit hyperactivity disorder, unspecified type: Secondary | ICD-10-CM | POA: Diagnosis not present

## 2021-02-25 DIAGNOSIS — F39 Unspecified mood [affective] disorder: Secondary | ICD-10-CM | POA: Diagnosis not present

## 2021-03-16 ENCOUNTER — Ambulatory Visit (HOSPITAL_COMMUNITY)
Admission: EM | Admit: 2021-03-16 | Discharge: 2021-03-16 | Disposition: A | Discharge status: Home / Self Care | Payer: Medicaid Other | Attending: Family Medicine | Admitting: Family Medicine

## 2021-03-16 ENCOUNTER — Encounter (HOSPITAL_COMMUNITY): Payer: Self-pay | Admitting: Emergency Medicine

## 2021-03-16 ENCOUNTER — Other Ambulatory Visit: Payer: Self-pay

## 2021-03-16 DIAGNOSIS — A084 Viral intestinal infection, unspecified: Secondary | ICD-10-CM | POA: Insufficient documentation

## 2021-03-16 DIAGNOSIS — Z113 Encounter for screening for infections with a predominantly sexual mode of transmission: Secondary | ICD-10-CM | POA: Insufficient documentation

## 2021-03-16 LAB — POCT URINALYSIS DIPSTICK, ED / UC
Glucose, UA: NEGATIVE mg/dL
Leukocytes,Ua: NEGATIVE
Nitrite: NEGATIVE
Protein, ur: 100 mg/dL — AB
Specific Gravity, Urine: 1.015 (ref 1.005–1.030)
Urobilinogen, UA: 1 mg/dL (ref 0.0–1.0)
pH: 6.5 (ref 5.0–8.0)

## 2021-03-16 LAB — HIV ANTIBODY (ROUTINE TESTING W REFLEX): HIV Screen 4th Generation wRfx: NONREACTIVE

## 2021-03-16 LAB — POC URINE PREG, ED: Preg Test, Ur: NEGATIVE

## 2021-03-16 MED ORDER — ONDANSETRON 4 MG PO TBDP: 4.0000 mg | ORAL_TABLET | Freq: Three times a day (TID) | ORAL | 0 refills | Status: DC | PRN

## 2021-03-16 NOTE — ED Triage Notes (Signed)
Pt reports since Monday has vomiting, abd pains, little diarrhea and some left sided chest pains. Thought was food poisoning bc her children were sick as well and ate same thing Sunday night, but pt reports still having symptoms. ? ?Pt wants to be tested for STDs while she is here also. Denies any exposures or s/s.  ?

## 2021-03-16 NOTE — Discharge Instructions (Signed)
Please do your best to ensure adequate fluid intake in order to avoid dehydration. If you find that you are unable to tolerate drinking fluids regularly please proceed to the Emergency Department for evaluation. ? ?We have sent testing for sexually transmitted infections. We will notify you of any positive results once they are received. If required, we will prescribe any medications you might need. ? ?Please refrain from all sexual activity for at least the next seven days. ? ? ?

## 2021-03-17 LAB — CERVICOVAGINAL ANCILLARY ONLY
Bacterial Vaginitis (gardnerella): NEGATIVE
Candida Glabrata: NEGATIVE
Candida Vaginitis: NEGATIVE
Chlamydia: NEGATIVE
Comment: NEGATIVE
Comment: NEGATIVE
Comment: NEGATIVE
Comment: NEGATIVE
Comment: NEGATIVE
Comment: NORMAL
Neisseria Gonorrhea: NEGATIVE
Trichomonas: NEGATIVE

## 2021-03-17 LAB — RPR: RPR Ser Ql: NONREACTIVE

## 2021-03-17 NOTE — ED Provider Notes (Signed)
?[facility] ? ? ?[id number] ?03/16/21 Arrival Time: 1504 ? ?ASSESSMENT & PLAN: ? ?1. Viral gastroenteritis   ?2. Screening for STDs (sexually transmitted diseases)   ? ?Labs Reviewed  ?POCT URINALYSIS DIPSTICK, ED / UC - Abnormal; Notable for the following components:  ?    Result Value  ? Bilirubin Urine SMALL (*)   ? Ketones, ur TRACE (*)   ? Hgb urine dipstick LARGE (*)   ? Protein, ur 100 (*)   ? All other components within normal limits  ?UPT negative.  ?  ?Pending: ?RPR  ?HIV ANTIBODY (ROUTINE TESTING W REFLEX)  ?  ?CERVICOVAGINAL ANCILLARY ONLY  ?  ?  ?Will notify of any significant results. ? ?Tolerating PO fluids. ?If needed: ?Meds ordered this encounter  ?Medications  ? ondansetron (ZOFRAN-ODT) 4 MG disintegrating tablet  ?  Sig: Take 1 tablet (4 mg total) by mouth every 8 (eight) hours as needed for nausea or vomiting.  ?  Dispense:  15 tablet  ?  Refill:  0  ? ? ?Discussed typical duration of symptoms for suspected viral GI illness. ?Will do her best to ensure adequate fluid intake in order to avoid dehydration. ?Will proceed to the Emergency Department for evaluation if unable to tolerate PO fluids regularly. ? ?Otherwise she will f/u with her PCP or here if not showing improvement over the next 48-72 hours. ? ?Reviewed expectations re: course of current medical issues. Questions answered. ?Outlined signs and symptoms indicating need for more acute intervention. ?Patient verbalized understanding. ?After Visit Summary given. ? ? ?SUBJECTIVE: ?History from: patient. ? ?Kristen Dean is a 21 y.o. female who presents with complaint of non-bilious, non-bloody n/v with non-bloody diarrhea. Onset  over past 24-48 hours . Abdominal discomfort: mild and cramping. Symptoms are stable since beginning. Aggravating factors: eating. Alleviating factors: none identified. Associated symptoms: fatigue. Child with same symptoms. She denies fever. Appetite: decreased. PO intake: decreased. Ambulatory  without assistance. Urinary symptoms: none. ?Recent travel or camping: none. ?OTC treatment: none. ? ?Patient's last menstrual period was 03/14/2021. ? ?Past Surgical History:  ?Procedure Laterality Date  ? NO PAST SURGERIES    ? ?OBJECTIVE: ? ?Vitals:  ? 03/16/21 1607  ?BP: 118/63  ?Pulse: 83  ?Resp: 16  ?Temp: 97.7 ?F (36.5 ?C)  ?TempSrc: Oral  ?SpO2: 98%  ?  ?General appearance: alert; no distress ?Oropharynx: moist ?Lungs: clear to auscultation bilaterally; unlabored ?Heart: regular ?Abdomen: soft; non-distended; no significant abdominal tenderness; no guarding or rebound tenderness ?Back: no CVA tenderness ?Extremities: no edema; symmetrical with no gross deformities ?Skin: warm; dry ?Neurologic: normal gait ?Psychological: alert and cooperative; normal mood and affect ? ?Labs: ? ?Labs Reviewed  ?POCT URINALYSIS DIPSTICK, ED / UC - Abnormal; Notable for the following components:  ?    Result Value  ? Bilirubin Urine SMALL (*)   ? Ketones, ur TRACE (*)   ? Hgb urine dipstick LARGE (*)   ? Protein, ur 100 (*)   ? All other components within normal limits  ?RPR  ?HIV ANTIBODY (ROUTINE TESTING W REFLEX)  ?POC URINE PREG, ED  ?CERVICOVAGINAL ANCILLARY ONLY  ? ? ?No Known Allergies ?                                            ?Past Medical History:  ?Diagnosis Date  ? GERD (gastroesophageal reflux disease) 08/08/2017  ? Medical  history non-contributory   ? Tension headache 06/16/2020  ? ?Social History  ? ?Socioeconomic History  ? Marital status: Single  ?  Spouse name: Not on file  ? Number of children: Not on file  ? Years of education: Not on file  ? Highest education level: Not on file  ?Occupational History  ? Not on file  ?Tobacco Use  ? Smoking status: Never  ? Smokeless tobacco: Never  ?Vaping Use  ? Vaping Use: Never used  ?Substance and Sexual Activity  ? Alcohol use: Yes  ?  Comment: occ glass of wine  ? Drug use: No  ? Sexual activity: Not Currently  ?  Birth control/protection: None  ?Other Topics Concern   ? Not on file  ?Social History Narrative  ? Not on file  ? ?Social Determinants of Health  ? ?Financial Resource Strain: Not on file  ?Food Insecurity: Not on file  ?Transportation Needs: Not on file  ?Physical Activity: Not on file  ?Stress: Not on file  ?Social Connections: Not on file  ?Intimate Partner Violence: Not on file  ? ?Family History  ?Problem Relation Age of Onset  ? Diabetes Mother   ? Hypertension Mother   ? Diabetes Sister   ? Mental retardation Sister   ? Heart disease Maternal Aunt   ? Diabetes Maternal Aunt   ? Diabetes Maternal Uncle   ? Diabetes Maternal Grandmother   ? Heart disease Maternal Grandfather   ? Diabetes Maternal Grandfather   ? Colon cancer Neg Hx   ? Esophageal cancer Neg Hx   ? Pancreatic cancer Neg Hx   ? Stomach cancer Neg Hx   ? ? ?  ?Mardella Layman, MD ?03/17/21 0945 ? ?

## 2021-03-25 DIAGNOSIS — F39 Unspecified mood [affective] disorder: Secondary | ICD-10-CM | POA: Diagnosis not present

## 2021-03-25 DIAGNOSIS — F909 Attention-deficit hyperactivity disorder, unspecified type: Secondary | ICD-10-CM | POA: Diagnosis not present

## 2021-04-26 ENCOUNTER — Ambulatory Visit (INDEPENDENT_AMBULATORY_CARE_PROVIDER_SITE_OTHER): Payer: Medicaid Other | Admitting: Family Medicine

## 2021-04-26 ENCOUNTER — Other Ambulatory Visit (HOSPITAL_COMMUNITY)
Admission: RE | Admit: 2021-04-26 | Discharge: 2021-04-26 | Disposition: A | Discharge status: Home / Self Care | Payer: Medicaid Other | Source: Ambulatory Visit | Attending: Family Medicine | Admitting: Family Medicine

## 2021-04-26 VITALS — BP 116/66 | HR 84 | Ht 68.0 in | Wt 248.0 lb

## 2021-04-26 DIAGNOSIS — N949 Unspecified condition associated with female genital organs and menstrual cycle: Secondary | ICD-10-CM

## 2021-04-26 DIAGNOSIS — Z113 Encounter for screening for infections with a predominantly sexual mode of transmission: Secondary | ICD-10-CM | POA: Insufficient documentation

## 2021-04-26 DIAGNOSIS — N898 Other specified noninflammatory disorders of vagina: Secondary | ICD-10-CM | POA: Diagnosis present

## 2021-04-26 DIAGNOSIS — Z114 Encounter for screening for human immunodeficiency virus [HIV]: Secondary | ICD-10-CM | POA: Diagnosis not present

## 2021-04-26 DIAGNOSIS — Z1159 Encounter for screening for other viral diseases: Secondary | ICD-10-CM | POA: Diagnosis not present

## 2021-04-26 HISTORY — DX: Encounter for screening for infections with a predominantly sexual mode of transmission: Z11.3

## 2021-04-26 LAB — POCT WET PREP (WET MOUNT)
Clue Cells Wet Prep Whiff POC: NEGATIVE
Trichomonas Wet Prep HPF POC: ABSENT

## 2021-04-26 NOTE — Patient Instructions (Signed)
It was good seeing you today.  We collected samples to test for STDs.  On our examination today we have not seen any but we do have to send the samples off for further evaluation.  We will also collect lab work.  I will call you with those results if there are any abnormalities or send something to your MyChart if it is completely normal.  If you have any questions or concerns please call the clinic.  I hope you have a wonderful day! ?

## 2021-04-26 NOTE — Progress Notes (Signed)
? ? ?  SUBJECTIVE:  ? ?CHIEF COMPLAINT / HPI:  ? ?Vaginal discharge ?Patient presents with 1 week history of abnormal vaginal discharge.  Reports that she was sexually active 3 days ago but denies any contact with STIs.  Had discontinued her birth control but restarted over a week ago.  LMP was 04/10/2021 and has not been sexually active until 3 days ago.  Is on Sprintec.  Reports that she is happy with this medication but will consider LARC such as IUD.  Would like STD screening today including blood screening ? ?OBJECTIVE:  ? ?BP 116/66   Pulse 84   Ht 5\' 8"  (1.727 m)   Wt 248 lb (112.5 kg)   LMP 04/10/2021   SpO2 97%   BMI 37.71 kg/m?   ?General: Pleasant 21 year old female in no acute distress ?Cardiac: Regular rate ?Respiratory: Normal work of breathing ?GU: Normal external vaginal tissue, normal internal vaginal tissue without erythema.  Minimal discharge, no cervical erythema or friability ? ? ?ASSESSMENT/PLAN:  ? ?Screening for STDs (sexually transmitted diseases) ?Patient presents for screening for STDs.  No known contacts.  Wet prep today negative.  Will send for GC/chlamydia.  RPR and HIV collected today as well.  We will call patient with results of any abnormalities and treat as needed.  Return precautions given. ?  ? ? ?21, MD ?[facility]  ? ?

## 2021-04-26 NOTE — Assessment & Plan Note (Signed)
Patient presents for screening for STDs.  No known contacts.  Wet prep today negative.  Will send for GC/chlamydia.  RPR and HIV collected today as well.  We will call patient with results of any abnormalities and treat as needed.  Return precautions given. ?

## 2021-04-27 LAB — CERVICOVAGINAL ANCILLARY ONLY
Chlamydia: NEGATIVE
Comment: NEGATIVE
Comment: NORMAL
Neisseria Gonorrhea: NEGATIVE

## 2021-04-27 LAB — RPR: RPR Ser Ql: NONREACTIVE

## 2021-04-27 LAB — HIV ANTIBODY (ROUTINE TESTING W REFLEX): HIV Screen 4th Generation wRfx: NONREACTIVE

## 2021-06-07 ENCOUNTER — Encounter: Payer: Self-pay | Admitting: *Deleted

## 2021-07-28 ENCOUNTER — Encounter: Payer: Self-pay | Admitting: Family Medicine

## 2021-07-28 ENCOUNTER — Other Ambulatory Visit: Payer: Self-pay | Admitting: Family Medicine

## 2021-07-29 ENCOUNTER — Encounter: Payer: Self-pay | Admitting: Student

## 2021-09-30 ENCOUNTER — Encounter: Payer: Self-pay | Admitting: *Deleted

## 2021-10-17 ENCOUNTER — Ambulatory Visit: Payer: Medicaid Other

## 2021-10-18 ENCOUNTER — Other Ambulatory Visit: Payer: Self-pay

## 2021-10-18 ENCOUNTER — Ambulatory Visit (INDEPENDENT_AMBULATORY_CARE_PROVIDER_SITE_OTHER): Payer: Medicaid Other | Admitting: Student

## 2021-10-18 ENCOUNTER — Other Ambulatory Visit (HOSPITAL_COMMUNITY)
Admission: RE | Admit: 2021-10-18 | Discharge: 2021-10-18 | Disposition: A | Discharge status: Home / Self Care | Payer: Medicaid Other | Source: Ambulatory Visit | Attending: Family Medicine | Admitting: Family Medicine

## 2021-10-18 VITALS — BP 140/68 | HR 68 | Wt 236.6 lb

## 2021-10-18 DIAGNOSIS — N898 Other specified noninflammatory disorders of vagina: Secondary | ICD-10-CM

## 2021-10-18 DIAGNOSIS — Z113 Encounter for screening for infections with a predominantly sexual mode of transmission: Secondary | ICD-10-CM | POA: Diagnosis not present

## 2021-10-18 DIAGNOSIS — Z23 Encounter for immunization: Secondary | ICD-10-CM

## 2021-10-18 LAB — POCT URINALYSIS DIP (MANUAL ENTRY)
Bilirubin, UA: NEGATIVE
Blood, UA: NEGATIVE
Glucose, UA: NEGATIVE mg/dL
Ketones, POC UA: NEGATIVE mg/dL
Leukocytes, UA: NEGATIVE
Nitrite, UA: NEGATIVE
Protein Ur, POC: NEGATIVE mg/dL
Spec Grav, UA: 1.025 (ref 1.010–1.025)
Urobilinogen, UA: 0.2 E.U./dL
pH, UA: 6 (ref 5.0–8.0)

## 2021-10-18 NOTE — Patient Instructions (Addendum)
It was wonderful to meet you today. Thank you for allowing me to be a part of your care. Below is a short summary of what we discussed at your visit today:  For your vaginal itchiness we are obtaining labs today to test for UTI,  fungi infection, bacteria Vaginosis and STI which includes gonorrhea, chlamydia, and trichomonas  UTI test was negative  We will follow-up with you if you have abnormal results.  Please bring all of your medications to every appointment!  If you have any questions or concerns, please do not hesitate to contact us via phone or MyChart message.   Alen Bleacher, MD [facility]

## 2021-10-18 NOTE — Progress Notes (Signed)
    SUBJECTIVE:   CHIEF COMPLAINT / HPI:   21 y.o. female presents for routine STD testing.  She is sexually active with more partner and inconsistent with condom use. Denies vaginal discharge, itchiness or odor. She does irritation around the urethra ox but not with urination No new back pain, fever, no chills. She report having chronic back pain for years. No hematuria or recent change in medication.  LMP 09/22/25/23   PERTINENT  PMH / PSH: Reviewed  OBJECTIVE:   BP (!) 140/68   Pulse 68   Wt 236 lb 9.6 oz (107.3 kg)   SpO2 100%   BMI 35.97 kg/m    Physical Exam General: Alert, well appearing, NAD Cardiovascular: Regular rate, well-perfused Respiratory: Normal work of breathing on room air Abdomen: No distension or tenderness MSK: No CVA tenderness  Pelvic exam performed by Dr. Gwendlyn Deutscher.  Please see her addendum to the note ASSESSMENT/PLAN:   Dysuria Patient with complain of vaginal irritation but no dysuria, vaginal discharge or odor.Her vaginal exam was unremarkable.  Point-of-care urinalysis was negative.  She is sexually active with inconsistent use of protection,  we will obtain lab for gonorrhea, chlamydia, trichomonas, HIV and syphilis.   HCM Patient received her influenza vaccination today.  Risk and benefit reviewed with patient. Alen Bleacher, MD [place]

## 2021-10-19 ENCOUNTER — Other Ambulatory Visit: Payer: Self-pay | Admitting: Student

## 2021-10-19 DIAGNOSIS — A749 Chlamydial infection, unspecified: Secondary | ICD-10-CM

## 2021-10-19 LAB — CERVICOVAGINAL ANCILLARY ONLY
Bacterial Vaginitis (gardnerella): NEGATIVE
Candida Glabrata: NEGATIVE
Candida Vaginitis: NEGATIVE
Chlamydia: POSITIVE — AB
Comment: NEGATIVE
Comment: NEGATIVE
Comment: NEGATIVE
Comment: NEGATIVE
Comment: NEGATIVE
Comment: NORMAL
Neisseria Gonorrhea: NEGATIVE
Trichomonas: NEGATIVE

## 2021-10-19 LAB — RPR: RPR Ser Ql: NONREACTIVE

## 2021-10-19 LAB — HIV ANTIBODY (ROUTINE TESTING W REFLEX): HIV Screen 4th Generation wRfx: NONREACTIVE

## 2021-10-19 MED ORDER — DOXYCYCLINE HYCLATE 100 MG PO TABS: 100.0000 mg | ORAL_TABLET | Freq: Two times a day (BID) | ORAL | 0 refills | Status: AC

## 2021-10-19 NOTE — Progress Notes (Signed)
Called patient to inform her that her STD testing was positive for Chlamydia. Sent in prescription for Doxycycline which she will take 100mg  twice daily for 7 days. Patient verbalized understanding and agreeable to plan. Return precautions reviewed

## 2021-10-27 ENCOUNTER — Telehealth: Payer: Self-pay

## 2021-10-27 NOTE — Telephone Encounter (Signed)
Patient calls nurse line regarding chlamydia treatment. She reports that she has been taking doxycycline 100 mg since 10/18. However, instead of taking BID, she has been taking 200 mg at one time.   Spoke with Dr. Owens Shark who recommended reaching out to Dr. Valentina Lucks regarding question about adequate treatment.   Talbot Grumbling, RN

## 2021-10-28 ENCOUNTER — Ambulatory Visit
Admission: RE | Admit: 2021-10-28 | Discharge: 2021-10-28 | Disposition: A | Discharge status: Home / Self Care | Payer: Medicaid Other | Source: Ambulatory Visit | Attending: Urgent Care | Admitting: Urgent Care

## 2021-10-28 VITALS — BP 134/76 | HR 71 | Temp 98.2°F | Resp 18

## 2021-10-28 DIAGNOSIS — B3731 Acute candidiasis of vulva and vagina: Secondary | ICD-10-CM | POA: Diagnosis present

## 2021-10-28 DIAGNOSIS — N898 Other specified noninflammatory disorders of vagina: Secondary | ICD-10-CM

## 2021-10-28 MED ORDER — FLUCONAZOLE 150 MG PO TABS: 150.0000 mg | ORAL_TABLET | ORAL | 0 refills | Status: DC

## 2021-10-28 NOTE — Telephone Encounter (Signed)
Patient calls nurse line.   Patient reports she had intercourse last night and the condom broke.   Patient reports she finished treatment ~ 2 days ago. She reports her partner finished his as well.   Patient advised to schedule an apt for test cure in a few weeks.   Patient agreed with plan.

## 2021-10-28 NOTE — ED Provider Notes (Signed)
[facility]  Note:  This document was prepared using Conservation officer, historic buildings and may include unintentional dictation errors.  MRN: [id number] DOB: 2000/08/06  Subjective:   Kristen Dean is a 21 y.o. female presenting for 2-day history of acute onset vaginal itching and irritation.  Patient is concerned about having foreign body in the vaginal canal.  Reports that she was having sex and the condom broke, noticed that the tip was gone.  No vaginal pain, dysuria, pelvic pain, fever, nausea, vomiting, vaginal discharge.  She did complete a course of doxycycline on 10/26/2021 for diagnosis of chlamydia.  She tested negative for the other sexually transmitted infections per patient.  No current facility-administered medications for this encounter.  Current Outpatient Medications:    ondansetron (ZOFRAN-ODT) 4 MG disintegrating tablet, Take 1 tablet (4 mg total) by mouth every 8 (eight) hours as needed for nausea or vomiting. (Patient not taking: Reported on 10/28/2021), Disp: 15 tablet, Rfl: 0   TRI-LO-SPRINTEC 0.18/0.215/0.25 MG-25 MCG tab, TAKE 1 TABLET BY MOUTH DAILY (Patient not taking: Reported on 10/28/2021), Disp: 84 tablet, Rfl: 1   Allergies  Allergen Reactions   Latex Itching    Past Medical History:  Diagnosis Date   GERD (gastroesophageal reflux disease) 08/08/2017   Medical history non-contributory    Tension headache 06/16/2020     Past Surgical History:  Procedure Laterality Date   NO PAST SURGERIES      Family History  Problem Relation Age of Onset   Diabetes Mother    Hypertension Mother    Diabetes Sister    Mental retardation Sister    Heart disease Maternal Aunt    Diabetes Maternal Aunt    Diabetes Maternal Uncle    Diabetes Maternal Grandmother    Heart disease Maternal Grandfather    Diabetes Maternal Grandfather    Colon cancer Neg Hx    Esophageal cancer Neg Hx    Pancreatic cancer Neg Hx    Stomach cancer Neg  Hx     Social History   Tobacco Use   Smoking status: Never   Smokeless tobacco: Never  Vaping Use   Vaping Use: Never used  Substance Use Topics   Alcohol use: Not Currently    Comment: occ glass of wine   Drug use: No    ROS   Objective:   Vitals: BP 134/76 (BP Location: Left Arm)   Pulse 71   Temp 98.2 F (36.8 C) (Oral)   Resp 18   LMP 10/22/2021   SpO2 98%   Physical Exam Exam conducted with a chaperone present (RN Narda Amber).  Constitutional:      General: She is not in acute distress.    Appearance: Normal appearance. She is well-developed. She is not ill-appearing, toxic-appearing or diaphoretic.  HENT:     Head: Normocephalic and atraumatic.     Nose: Nose normal.     Mouth/Throat:     Mouth: Mucous membranes are moist.  Eyes:     General: No scleral icterus.       Right eye: No discharge.        Left eye: No discharge.     Extraocular Movements: Extraocular movements intact.  Cardiovascular:     Rate and Rhythm: Normal rate.  Pulmonary:     Effort: Pulmonary effort is normal.  Genitourinary:    Labia:        Right: No rash, tenderness, lesion or injury.  Left: No rash, tenderness, lesion or injury.      Cervix: Discharge (slight, yellow) present. No cervical motion tenderness, friability, lesion, erythema, cervical bleeding or eversion.     Comments: No foreign body appreciated. Skin:    General: Skin is warm and dry.  Neurological:     General: No focal deficit present.     Mental Status: She is alert and oriented to person, place, and time.  Psychiatric:        Mood and Affect: Mood normal.        Behavior: Behavior normal.     Assessment and Plan :   PDMP not reviewed this encounter.  1. Yeast vaginitis   2. Vaginal itching     We will treat empirically for yeast vaginitis given her recent course of doxycycline.  Discussed the nature of STI testing and the fact that she could remain positive given her recent chlamydial  infection.  Still patient wants to pursue testing. Counseled patient on potential for adverse effects with medications prescribed/recommended today, ER and return-to-clinic precautions discussed, patient verbalized understanding.    Jaynee Eagles, [place] 10/28/21 1541

## 2021-10-28 NOTE — ED Triage Notes (Signed)
Pt states that she was treated 1 week ago for STI. Pt states that she completed the antibiotics Tuesday. Pt c/o vaginal itching and noticed that the condom busted and she only noted the tip of the condom was gone.

## 2021-10-31 ENCOUNTER — Ambulatory Visit: Payer: Medicaid Other

## 2021-10-31 LAB — CERVICOVAGINAL ANCILLARY ONLY
Bacterial Vaginitis (gardnerella): NEGATIVE
Candida Glabrata: NEGATIVE
Candida Vaginitis: NEGATIVE
Chlamydia: POSITIVE — AB
Comment: NEGATIVE
Comment: NEGATIVE
Comment: NEGATIVE
Comment: NEGATIVE
Comment: NEGATIVE
Comment: NORMAL
Neisseria Gonorrhea: NEGATIVE
Trichomonas: NEGATIVE

## 2021-10-31 NOTE — Progress Notes (Deleted)
    SUBJECTIVE:   CHIEF COMPLAINT / HPI:   Kristen Dean is a 21 y.o. female who presents to the [facility] clinic today to discuss the following concerns:   Vaginal Irritation Patient was seen at urgent care on 10/27 for 2-day history of vaginal itching and irritation.  She is concerned about foreign body in her vaginal canal-possibly tip of condom.  She recently completed a course of doxycycline on 10/25 for chlamydia.  On pelvic examination there was no foreign body appreciated.  She was treated empirically for yeast vaginitis given her recent antibiotics. Her STI testing is still in process.    PERTINENT  PMH / PSH: N/A  OBJECTIVE:   LMP 10/22/2021  ***  General: NAD, pleasant, able to participate in exam Abdomen: Bowel sounds present, nontender, nondistended, no hepatosplenomegaly. GU: Normal appearance of labia majora and minora, without lesions. Vagina tissue pink, moist, without lesions or abrasions. Cervix normal appearance, non-friable, without discharge from os.  Psych: Normal affect and mood  GU exam chaperoned by *** CMA   ASSESSMENT/PLAN:   No problem-specific Assessment & Plan notes found for this encounter.     Sharion Settler, [place]

## 2021-12-04 ENCOUNTER — Encounter: Payer: Self-pay | Admitting: Emergency Medicine

## 2021-12-04 ENCOUNTER — Ambulatory Visit
Admission: EM | Admit: 2021-12-04 | Discharge: 2021-12-04 | Disposition: A | Discharge status: Home / Self Care | Payer: Medicaid Other | Attending: Emergency Medicine | Admitting: Emergency Medicine

## 2021-12-04 DIAGNOSIS — Z113 Encounter for screening for infections with a predominantly sexual mode of transmission: Secondary | ICD-10-CM | POA: Insufficient documentation

## 2021-12-04 DIAGNOSIS — N760 Acute vaginitis: Secondary | ICD-10-CM | POA: Diagnosis not present

## 2021-12-04 LAB — POCT URINALYSIS DIP (MANUAL ENTRY)
Bilirubin, UA: NEGATIVE
Blood, UA: NEGATIVE
Glucose, UA: NEGATIVE mg/dL
Ketones, POC UA: NEGATIVE mg/dL
Leukocytes, UA: NEGATIVE
Nitrite, UA: NEGATIVE
Protein Ur, POC: NEGATIVE mg/dL
Spec Grav, UA: 1.02 (ref 1.010–1.025)
Urobilinogen, UA: 1 E.U./dL
pH, UA: 7.5 (ref 5.0–8.0)

## 2021-12-04 LAB — POCT URINE PREGNANCY: Preg Test, Ur: NEGATIVE

## 2021-12-04 MED ORDER — CLOTRIMAZOLE 1 % VA CREA: TOPICAL_CREAM | VAGINAL | 0 refills | Status: DC

## 2021-12-04 NOTE — Discharge Instructions (Addendum)
The results of your vaginal swab test which screens for BV, yeast, gonorrhea, chlamydia and trichomonas will be made posted to your MyChart account once it is complete.  This typically takes 2 to 4 days.  Please abstain from sexual intercourse of any kind, vaginal, oral or anal, until you have received the results of your STD testing.     If any of your results are abnormal, you will receive a phone call regarding treatment.  Prescriptions, if any are needed, will be provided for you at your pharmacy.     For comfort, while you are waiting for the result of your vaginal swab test, I have provided you with an external yeast infection cream that you can apply 3-4 times daily for relief of vaginal itching and burning.  I recommend that you abstain from sexual intercourse, tampon use or any other other intravaginal activities while you are waiting on these results and possible treatment.   Your urine pregnancy test today is negative.   Our point-of-care analysis of your urine sample today was normal and did not reveal any concern for urinary tract infection.   If you have not had complete resolution of your symptoms after completing any needed treatment, please return for repeat evaluation.   Thank you for visiting urgent care today.  I appreciate the opportunity to participate in your care.  

## 2021-12-04 NOTE — ED Triage Notes (Signed)
Pt reports irritation with urination for  a few days and requesting STD testing .

## 2021-12-04 NOTE — ED Provider Notes (Signed)
[facility]    CSN: [id number] Arrival date & time: 12/04/21  1535    HISTORY   Chief Complaint  Patient presents with   SEXUALLY TRANSMITTED DISEASE   Dysuria   HPI Kristen Dean is a pleasant, 21 y.o. female who presents to urgent care today. Patient complains of vaginal irritation with urination for the past few days.  Patient is requesting testing for STD.  Patient endorses vaginal itching and vaginal irritation.  Patient denies abnormal odor of urine, increased frequency of urination, increased urge to urinate, sensation of incomplete emptying, suprapubic pain, perineal pain, flank pain, fever, chills, malaise, rigors, significant fatigue, abnormal vaginal discharge, genital lesion(s), and known exposure to STD.     The history is provided by the patient.   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   Chest pain, unspecified 11/11/2019   GERD (gastroesophageal reflux disease) 08/08/2017   Nausea and vomiting 02/16/2017   Vitamin D deficiency 02/21/2015   Learning disability 01/27/2013   Past Surgical History:  Procedure Laterality Date   NO PAST SURGERIES     OB History     Gravida  4   Para  2   Term  2   Preterm      AB  2   Living  2      SAB  2   IAB      Ectopic      Multiple  0   Live Births  2        Obstetric Comments  Pregnancy #2 Baby Boy, Vit D Deficiency, Fetal Pyelectasis that resolved        Home Medications    Prior to Admission medications   Medication Sig Start Date End Date Taking? Authorizing Provider    Family History Family  History  Problem Relation Age of Onset   Diabetes Mother    Hypertension Mother    Diabetes Sister    Mental retardation Sister    Heart disease Maternal Aunt    Diabetes Maternal Aunt    Diabetes Maternal Uncle    Diabetes Maternal Grandmother    Heart disease Maternal Grandfather    Diabetes Maternal Grandfather    Colon cancer Neg Hx    Esophageal cancer Neg Hx    Pancreatic cancer Neg Hx    Stomach cancer Neg Hx    Social History Social History   Tobacco Use   Smoking status: Never   Smokeless tobacco: Never  Vaping Use   Vaping Use: Never used  Substance Use Topics   Alcohol use: Not Currently    Comment: occ glass of wine   Drug use: No   Allergies   Latex  Review of Systems Review of Systems Pertinent findings revealed after performing a 14 point review of systems has been noted in the history of present illness.  Physical Exam Triage Vital Signs ED Triage Vitals  Enc Vitals Group     BP 10/29/20 0827 (!) 147/82     Pulse Rate 10/29/20 0827 72     Resp 10/29/20 0827 18     Temp 10/29/20 0827 98.3 F (36.8 C)  Temp Source 10/29/20 0827 Oral     SpO2 10/29/20 0827 98 %     Weight --      Height --      Head Circumference --      Peak Flow --      Pain Score 10/29/20 0826 5     Pain Loc --      Pain Edu? --      Excl. in GC? --   No data found.  Updated Vital Signs BP 136/85 (BP Location: Left Arm)   Pulse 74   Temp 99.1 F (37.3 C) (Oral)   Resp 18   SpO2 98%   Physical Exam Vitals and nursing note reviewed.  Constitutional:      General: She is not in acute distress.    Appearance: Normal appearance. She is not ill-appearing.  HENT:     Head: Normocephalic and atraumatic.  Eyes:     General: Lids are normal.        Right eye: No discharge.        Left eye: No discharge.     Extraocular Movements: Extraocular movements intact.     Conjunctiva/sclera: Conjunctivae normal.     Right eye: Right conjunctiva is not injected.     Left  eye: Left conjunctiva is not injected.  Neck:     Trachea: Trachea and phonation normal.  Cardiovascular:     Rate and Rhythm: Normal rate and regular rhythm.     Pulses: Normal pulses.     Heart sounds: Normal heart sounds. No murmur heard.    No friction rub. No gallop.  Pulmonary:     Effort: Pulmonary effort is normal. No accessory muscle usage, prolonged expiration or respiratory distress.     Breath sounds: Normal breath sounds. No stridor, decreased air movement or transmitted upper airway sounds. No decreased breath sounds, wheezing, rhonchi or rales.  Chest:     Chest wall: No tenderness.  Genitourinary:    Comments: Patient politely declines pelvic exam today, patient provided a vaginal swab for testing. Musculoskeletal:        General: Normal range of motion.     Cervical back: Normal range of motion and neck supple. Normal range of motion.  Lymphadenopathy:     Cervical: No cervical adenopathy.  Skin:    General: Skin is warm and dry.     Findings: No erythema or rash.  Neurological:     General: No focal deficit present.     Mental Status: She is alert and oriented to person, place, and time.  Psychiatric:        Mood and Affect: Mood normal.        Behavior: Behavior normal.     Visual Acuity Right Eye Distance:   Left Eye Distance:   Bilateral Distance:    Right Eye Near:   Left Eye Near:    Bilateral Near:     UC Couse / Diagnostics / Procedures:     Radiology No results found.  Procedures Procedures (including critical care time) EKG  Pending results:  Labs Reviewed  POCT URINALYSIS DIP (MANUAL ENTRY)  POCT URINE PREGNANCY  CERVICOVAGINAL ANCILLARY ONLY    Medications Ordered in UC: Medications - No data to display  UC Diagnoses / Final Clinical Impressions(s)   I have reviewed the triage vital signs and the nursing notes.  Pertinent labs & imaging results that were available during my care of the patient were reviewed by me and  considered  in my medical decision making (see chart for details).    Final diagnoses:  Screening examination for STD (sexually transmitted disease)  Vulvovaginitis   STD screening performed at patient's request.  Urinalysis unremarkable.  Urine pregnancy test today was negative, will provide treatment as needed based on results of STD screening.  Patient provided with clotrimazole cream to apply to vulvovaginal area twice daily for comfort while waiting for results.  Return precautions advised.  ED Prescriptions     Medication Sig Dispense Auth. Provider   clotrimazole (GYNE-LOTRIMIN) 1 % vaginal cream Apply to external vaginal area 3-4 times daily as needed for vulvovaginitis. 45 g Theadora Rama Scales, PA-C      PDMP not reviewed this encounter.  Disposition Upon Discharge:  Condition: stable for discharge home Home: take medications as prescribed; routine discharge instructions as discussed; follow up as advised.  Patient presented with an acute illness with associated systemic symptoms and significant discomfort requiring urgent management. In my opinion, this is a condition that a prudent lay person (someone who possesses an average knowledge of health and medicine) may potentially expect to result in complications if not addressed urgently such as respiratory distress, impairment of bodily function or dysfunction of bodily organs.   Routine symptom specific, illness specific and/or disease specific instructions were discussed with the patient and/or caregiver at length.   As such, the patient has been evaluated and assessed, work-up was performed and treatment was provided in alignment with urgent care protocols and evidence based medicine.  Patient/parent/caregiver has been advised that the patient may require follow up for further testing and treatment if the symptoms continue in spite of treatment, as clinically indicated and appropriate.  If the patient was tested for COVID-19,  Influenza and/or RSV, then the patient/parent/guardian was advised to isolate at home pending the results of his/her diagnostic coronavirus test and potentially longer if they're positive. I have also advised pt that if his/her COVID-19 test returns positive, it's recommended to self-isolate for at least 10 days after symptoms first appeared AND until fever-free for 24 hours without fever reducer AND other symptoms have improved or resolved. Discussed self-isolation recommendations as well as instructions for household member/close contacts as per the [facility] and [place] DHHS, and also gave patient the COVID packet with this information.  Patient/parent/caregiver has been advised to return to the [facility] or PCP in 3-5 days if no better; to PCP or the Emergency Department if new signs and symptoms develop, or if the current signs or symptoms continue to change or worsen for further workup, evaluation and treatment as clinically indicated and appropriate  The patient will follow up with their current PCP if and as advised. If the patient does not currently have a PCP we will assist them in obtaining one.   The patient may need specialty follow up if the symptoms continue, in spite of conservative treatment and management, for further workup, evaluation, consultation and treatment as clinically indicated and appropriate.  Patient/parent/caregiver verbalized understanding and agreement of plan as discussed.  All questions were addressed during visit.  Please see discharge instructions below for further details of plan.  Discharge Instructions:   Discharge Instructions      The results of your vaginal swab test which screens for BV, yeast, gonorrhea, chlamydia and trichomonas will be made posted to your MyChart account once it is complete.  This typically takes 2 to 4 days.  Please abstain from sexual intercourse of any kind, vaginal, oral or anal, until you have  received the results of your STD testing.     If any  of your results are abnormal, you will receive a phone call regarding treatment.  Prescriptions, if any are needed, will be provided for you at your pharmacy.     For comfort, while you are waiting for the result of your vaginal swab test, I have provided you with an external yeast infection cream that you can apply 3-4 times daily for relief of vaginal itching and burning.  I recommend that you abstain from sexual intercourse, tampon use or any other other intravaginal activities while you are waiting on these results and possible treatment.   Your urine pregnancy test today is negative.   Our point-of-care analysis of your urine sample today was normal and did not reveal any concern for urinary tract infection.   If you have not had complete resolution of your symptoms after completing any needed treatment, please return for repeat evaluation.   Thank you for visiting urgent care today.  I appreciate the opportunity to participate in your care.       This office note has been dictated using Teaching laboratory technicianDragon speech recognition software.  Unfortunately, this method of dictation can sometimes lead to typographical or grammatical errors.  I apologize for your inconvenience in advance if this occurs.  Please do not hesitate to reach out to me if clarification is needed.      Theadora RamaMorgan, Rayhan Groleau Scales, PA-C 12/04/21 [phone]

## 2021-12-06 LAB — CERVICOVAGINAL ANCILLARY ONLY
Bacterial Vaginitis (gardnerella): NEGATIVE
Candida Glabrata: NEGATIVE
Candida Vaginitis: NEGATIVE
Chlamydia: POSITIVE — AB
Comment: NEGATIVE
Comment: NEGATIVE
Comment: NEGATIVE
Comment: NEGATIVE
Comment: NEGATIVE
Comment: NORMAL
Neisseria Gonorrhea: NEGATIVE
Trichomonas: NEGATIVE

## 2021-12-07 ENCOUNTER — Telehealth (HOSPITAL_COMMUNITY): Payer: Self-pay | Admitting: Emergency Medicine

## 2021-12-07 MED ORDER — DOXYCYCLINE HYCLATE 100 MG PO CAPS: 100.0000 mg | ORAL_CAPSULE | Freq: Two times a day (BID) | ORAL | 0 refills | Status: AC

## 2021-12-13 ENCOUNTER — Telehealth: Payer: Self-pay

## 2021-12-13 NOTE — Telephone Encounter (Signed)
Attempt to return patient call, voicemail left.

## 2021-12-13 NOTE — Telephone Encounter (Addendum)
Patient verification complete (name and date of birth).  Returned patient call, questions about medications. All questions answered.

## 2021-12-19 ENCOUNTER — Ambulatory Visit
Admission: RE | Admit: 2021-12-19 | Discharge: 2021-12-19 | Disposition: A | Discharge status: Home / Self Care | Payer: Medicaid Other | Source: Ambulatory Visit | Attending: Urgent Care | Admitting: Urgent Care

## 2021-12-19 VITALS — BP 129/82 | HR 68 | Temp 98.2°F | Resp 18

## 2021-12-19 DIAGNOSIS — B3731 Acute candidiasis of vulva and vagina: Secondary | ICD-10-CM | POA: Diagnosis present

## 2021-12-19 DIAGNOSIS — Z8619 Personal history of other infectious and parasitic diseases: Secondary | ICD-10-CM | POA: Diagnosis present

## 2021-12-19 LAB — POCT URINALYSIS DIP (MANUAL ENTRY)
Bilirubin, UA: NEGATIVE
Blood, UA: NEGATIVE
Glucose, UA: NEGATIVE mg/dL
Ketones, POC UA: NEGATIVE mg/dL
Leukocytes, UA: NEGATIVE
Nitrite, UA: NEGATIVE
Protein Ur, POC: NEGATIVE mg/dL
Spec Grav, UA: 1.015 (ref 1.010–1.025)
Urobilinogen, UA: 0.2 E.U./dL
pH, UA: 7 (ref 5.0–8.0)

## 2021-12-19 MED ORDER — FLUCONAZOLE 150 MG PO TABS: 150.0000 mg | ORAL_TABLET | ORAL | 0 refills | Status: DC

## 2021-12-19 NOTE — ED Triage Notes (Signed)
Pt c/o vaginal irritation/itching that has cont's after completed abx for STD at the beginning of the month-NAD-steady gait

## 2021-12-19 NOTE — ED Provider Notes (Signed)
[facility]  Note:  This document was prepared using Conservation officer, historic buildings and may include unintentional dictation errors.  MRN: [id number] DOB: [birth year]  Subjective:   Kristen Dean is a 21 y.o. female presenting for recheck on persistent vaginal irritation, some redness and itching.  Patient completed a course of doxycycline about 4 days ago.  This was for chlamydial infection as she test positive on 12/04/2021.  She previously test positive for this as well on 10/28/2021.  She completed the treatment for doxycycline which time.  Has no concern for pregnancy, declines pregnancy test.  No vaginal discharge, fever, nausea, vomiting, abdominal pelvic pain.  No current facility-administered medications for this encounter.  Current Outpatient Medications:    clotrimazole (GYNE-LOTRIMIN) 1 % vaginal cream, Apply to external vaginal area 3-4 times daily as needed for vulvovaginitis., Disp: 45 g, Rfl: 0   Allergies  Allergen Reactions   Latex Itching    Past Medical History:  Diagnosis Date   Chest pain, unspecified 11/11/2019   GERD (gastroesophageal reflux disease) 08/08/2017   Medical history non-contributory    Nausea and vomiting 02/16/2017   Screening for STDs (sexually transmitted diseases) 04/26/2021   Tension headache 06/16/2020     Past Surgical History:  Procedure Laterality Date   NO PAST SURGERIES      Family History  Problem Relation Age of Onset   Diabetes Mother    Hypertension Mother    Diabetes Sister    Mental retardation Sister    Heart disease Maternal Aunt    Diabetes Maternal Aunt    Diabetes Maternal Uncle    Diabetes Maternal Grandmother    Heart disease Maternal Grandfather    Diabetes Maternal Grandfather    Colon cancer Neg Hx    Esophageal cancer Neg Hx    Pancreatic cancer Neg Hx    Stomach cancer Neg Hx     Social History   Tobacco Use   Smoking status: Never   Smokeless tobacco: Never   Vaping Use   Vaping Use: Never used  Substance Use Topics   Alcohol use: Not Currently   Drug use: No    ROS   Objective:   Vitals: BP 129/82 (BP Location: Left Arm)   Pulse 68   Temp 98.2 F (36.8 C) (Oral)   Resp 18   LMP 12/14/2021   SpO2 98%   Physical Exam Exam conducted with a chaperone present (RT Dunning).  Constitutional:      General: She is not in acute distress.    Appearance: Normal appearance. She is well-developed. She is not ill-appearing, toxic-appearing or diaphoretic.  HENT:     Head: Normocephalic and atraumatic.     Nose: Nose normal.     Mouth/Throat:     Mouth: Mucous membranes are moist.  Eyes:     General: No scleral icterus.       Right eye: No discharge.        Left eye: No discharge.     Extraocular Movements: Extraocular movements intact.  Cardiovascular:     Rate and Rhythm: Normal rate.  Pulmonary:     Effort: Pulmonary effort is normal.  Genitourinary:    Labia:        Right: No rash, tenderness, lesion or injury.        Left: No rash, tenderness, lesion or injury.   Skin:    General: Skin is warm and dry.  Neurological:     General: No focal deficit  present.     Mental Status: She is alert and oriented to person, place, and time.  Psychiatric:        Mood and Affect: Mood normal.        Behavior: Behavior normal.    Results for orders placed or performed during the hospital encounter of 12/19/21 (from the past 24 hour(s))  POCT urinalysis dipstick     Status: None   Collection Time: 12/19/21  2:32 PM  Result Value Ref Range   Color, UA yellow yellow   Clarity, UA clear clear   Glucose, UA negative negative mg/dL   Bilirubin, UA negative negative   Ketones, POC UA negative negative mg/dL   Spec Grav, UA 1.015 1.010 - 1.025   Blood, UA negative negative   pH, UA 7.0 5.0 - 8.0   Protein Ur, POC negative negative mg/dL   Urobilinogen, UA 0.2 0.2 or 1.0 E.U./dL   Nitrite, UA Negative Negative   Leukocytes, UA Negative  Negative    Assessment and Plan :   PDMP not reviewed this encounter.  1. Yeast vaginitis   2. History of chlamydia     Recommended empiric treatment for yeast vaginitis with oral fluconazole.  No signs of an acute gynecologic emergency.  No signs of genital herpes, genital warts or other vaginal rash, abscess.  Will repeat her vaginal swab tests, however should she continue to test positive for chlamydia I do believe that this is residual from her recent infection and just having completed the doxycycline course.  I would not have her repeat doxycycline.  Patient is in agreement.  Counseled patient on potential for adverse effects with medications prescribed/recommended today, ER and return-to-clinic precautions discussed, patient verbalized understanding.    Jaynee Eagles, [place] 12/20/21 [phone]

## 2021-12-20 ENCOUNTER — Telehealth: Payer: Self-pay

## 2021-12-20 LAB — CERVICOVAGINAL ANCILLARY ONLY
Bacterial Vaginitis (gardnerella): NEGATIVE
Candida Glabrata: NEGATIVE
Candida Vaginitis: NEGATIVE
Chlamydia: NEGATIVE
Comment: NEGATIVE
Comment: NEGATIVE
Comment: NEGATIVE
Comment: NEGATIVE
Comment: NEGATIVE
Comment: NORMAL
Neisseria Gonorrhea: NEGATIVE
Trichomonas: NEGATIVE

## 2021-12-20 NOTE — Telephone Encounter (Signed)
Patient called stating that her test results for chlamydial were negative and wanted to confirmed.

## 2022-01-12 ENCOUNTER — Encounter: Payer: Self-pay | Admitting: Student

## 2022-01-13 ENCOUNTER — Other Ambulatory Visit: Payer: Self-pay | Admitting: Family Medicine

## 2022-01-13 ENCOUNTER — Encounter: Payer: Self-pay | Admitting: Student

## 2022-01-16 ENCOUNTER — Ambulatory Visit (INDEPENDENT_AMBULATORY_CARE_PROVIDER_SITE_OTHER): Payer: Medicaid Other | Admitting: Family Medicine

## 2022-01-16 ENCOUNTER — Encounter: Payer: Self-pay | Admitting: Family Medicine

## 2022-01-16 ENCOUNTER — Ambulatory Visit: Payer: Medicaid Other | Admitting: Family Medicine

## 2022-01-16 ENCOUNTER — Other Ambulatory Visit: Payer: Self-pay | Admitting: Student

## 2022-01-16 ENCOUNTER — Other Ambulatory Visit (HOSPITAL_COMMUNITY)
Admission: RE | Admit: 2022-01-16 | Discharge: 2022-01-16 | Disposition: A | Discharge status: Home / Self Care | Payer: Medicaid Other | Source: Ambulatory Visit | Attending: Family Medicine | Admitting: Family Medicine

## 2022-01-16 VITALS — BP 112/68 | HR 71 | Wt 236.0 lb

## 2022-01-16 DIAGNOSIS — N898 Other specified noninflammatory disorders of vagina: Secondary | ICD-10-CM

## 2022-01-16 DIAGNOSIS — B9689 Other specified bacterial agents as the cause of diseases classified elsewhere: Secondary | ICD-10-CM | POA: Insufficient documentation

## 2022-01-16 DIAGNOSIS — B3731 Acute candidiasis of vulva and vagina: Secondary | ICD-10-CM | POA: Diagnosis not present

## 2022-01-16 DIAGNOSIS — N760 Acute vaginitis: Secondary | ICD-10-CM | POA: Diagnosis not present

## 2022-01-16 HISTORY — DX: Acute vaginitis: B96.89

## 2022-01-16 HISTORY — DX: Acute candidiasis of vulva and vagina: B37.31

## 2022-01-16 LAB — POCT WET PREP (WET MOUNT)
Clue Cells Wet Prep Whiff POC: POSITIVE
Trichomonas Wet Prep HPF POC: ABSENT

## 2022-01-16 MED ORDER — METRONIDAZOLE 500 MG PO TABS: 500.0000 mg | ORAL_TABLET | Freq: Two times a day (BID) | ORAL | 0 refills | Status: AC

## 2022-01-16 MED ORDER — NORGESTIM-ETH ESTRAD TRIPHASIC 0.18/0.215/0.25 MG-25 MCG PO TABS: 1.0000 | ORAL_TABLET | Freq: Every day | ORAL | 3 refills | Status: DC

## 2022-01-16 MED ORDER — MICONAZOLE NITRATE 2 % EX CREA: 1.0000 | TOPICAL_CREAM | Freq: Two times a day (BID) | CUTANEOUS | 0 refills | Status: DC

## 2022-01-16 NOTE — Assessment & Plan Note (Signed)
Unclear etiology, although I suspect yeast versus BV causing this external irritation.  Wet prep with clue cells, indicative of BV.  Advised patient to continue her metronidazole prescription.  Given some erythema of the vulva on exam, will prescribe miconazole cream twice daily until improvement.  We also collected gonorrhea and chlamydia swab as well as Candida 6 species swab.  Will follow results.

## 2022-01-16 NOTE — Progress Notes (Signed)
    SUBJECTIVE:   CHIEF COMPLAINT / HPI:   Vaginal irritation PAP UTD - 11/24/2019 NILM Chlamydia (+) 12/04/2021, TOC (-) 12/19/2021 LMP 01/10/2022, currently day #7 of menses, normal menses for her Risk of pregnancy: No, currently on OCP taken correctly and currently having menses  Patient reports symptoms starting in October.  She was diagnosed with chlamydia 10/28/2021, prescribed doxycycline course but did not take it correctly.  Retested positive for chlamydia on 12/3, again prescribed doxycycline course, which she took correctly this time.  Negative test of cure 12/18.  She reports significant vulvar discomfort during this time, specifically including clitoral hood.  She was prescribed clotrimazole cream twice daily on 12/3, however she did not find this to be very useful and did not use it for a long time after she got the positive chlamydia result.  She was treated for a yeast infection around this time and reports that her symptoms did improve with the yeast infection treatment, but have now returned.  Reports low risk of reinfection with chlamydia, and she has not encountered that partner again.  She did have 1 episode of intercourse with condom since her negative TOC with a new partner.  Reports being seen by urgent care a couple days ago and was given metronidazole for report of fishy odor.  She just started this yesterday.  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   Chest pain, unspecified 11/11/2019   GERD (gastroesophageal reflux disease) 08/08/2017   Nausea and vomiting 02/16/2017   Vaginal irritation 11/09/2016   Vitamin D deficiency 02/21/2015   Learning  disability 01/27/2013    OBJECTIVE:   BP 112/68   Pulse 71   Wt 236 lb (107 kg)   LMP 01/10/2022 (Exact Date)   SpO2 98%   Breastfeeding No   BMI 35.88 kg/m    Physical Exam General: Awake, alert, oriented, no acute distress Respiratory: Normal work of breathing, no respiratory distress Neuro: Cranial nerves II through X grossly intact, able to move all extremities spontaneously Vulva: Normal appearing vulva without rashes, lesions, or deformities Vagina: Pale pink rugated vaginal tissue without obvious lesions, thin discharge of yellowish-greenish color, cervix without lesion or overt tenderness with swab  Sensitive exam performed with chaperone in the room:  Jazmin Hartsell, CMA  ASSESSMENT/PLAN:   Vaginal irritation Unclear etiology, although I suspect yeast versus BV causing this external irritation.  Wet prep with clue cells, indicative of BV.  Advised patient to continue her metronidazole prescription.  Given some erythema of the vulva on exam, will prescribe miconazole cream twice daily until improvement.  We also collected gonorrhea and chlamydia swab as well as Candida 6 species swab.  Will follow results.     Ezequiel Essex, MD [place]

## 2022-01-16 NOTE — Patient Instructions (Signed)
It was wonderful to see you today. Thank you for allowing me to be a part of your care. Below is a short summary of what we discussed at your visit today:  Vulvar irritation No sign of yeast in the vagina.  Use miconazole cream twice daily on labia majora and clitoral hood - anywhere EXTERNAL that is giving you symptoms.   Vaginal swab showed evidence of BV (bacterial vaginosis).  That is the imbalance of bacteria in the vagina.  This is not an STI. For the BV, continue taking the metronidazole as previously prescribed twice daily until finished. The abnormal discharge and fishy odor should resolve with taking the metronidazole.  Other screenings The vaginal swab to test for gonorrhea and chlamydia should come back in 2 to 3 days.  Result be available in MyChart. If the results are normal, I will send you a letter or MyChart message. If the results are abnormal, I will give you a call.    The vaginal swab to test for uncommon species of yeast should come back within about a week.  Results will be available in MyChart.    Please bring all of your medications to every appointment!  If you have any questions or concerns, please do not hesitate to contact us via phone or MyChart message.   Ezequiel Essex, MD

## 2022-01-17 LAB — CERVICOVAGINAL ANCILLARY ONLY
Chlamydia: NEGATIVE
Comment: NEGATIVE
Comment: NORMAL
Neisseria Gonorrhea: NEGATIVE

## 2022-01-27 ENCOUNTER — Other Ambulatory Visit: Payer: Self-pay | Admitting: Student

## 2022-01-30 ENCOUNTER — Encounter: Payer: Self-pay | Admitting: Student

## 2022-02-02 ENCOUNTER — Other Ambulatory Visit (HOSPITAL_COMMUNITY)
Admission: RE | Admit: 2022-02-02 | Discharge: 2022-02-02 | Disposition: A | Discharge status: Home / Self Care | Payer: Medicaid Other | Source: Ambulatory Visit | Attending: Family Medicine | Admitting: Family Medicine

## 2022-02-02 ENCOUNTER — Ambulatory Visit (INDEPENDENT_AMBULATORY_CARE_PROVIDER_SITE_OTHER): Payer: Medicaid Other | Admitting: Student

## 2022-02-02 VITALS — BP 116/64 | HR 80 | Wt 229.8 lb

## 2022-02-02 DIAGNOSIS — N898 Other specified noninflammatory disorders of vagina: Secondary | ICD-10-CM | POA: Insufficient documentation

## 2022-02-02 DIAGNOSIS — R631 Polydipsia: Secondary | ICD-10-CM

## 2022-02-02 DIAGNOSIS — R300 Dysuria: Secondary | ICD-10-CM | POA: Diagnosis not present

## 2022-02-02 DIAGNOSIS — N920 Excessive and frequent menstruation with regular cycle: Secondary | ICD-10-CM

## 2022-02-02 LAB — POCT URINALYSIS DIP (MANUAL ENTRY)
Bilirubin, UA: NEGATIVE
Blood, UA: NEGATIVE
Glucose, UA: NEGATIVE mg/dL
Ketones, POC UA: NEGATIVE mg/dL
Leukocytes, UA: NEGATIVE
Nitrite, UA: NEGATIVE
Spec Grav, UA: 1.025 (ref 1.010–1.025)
Urobilinogen, UA: 0.2 E.U./dL
pH, UA: 6 (ref 5.0–8.0)

## 2022-02-02 LAB — POCT WET PREP (WET MOUNT)
Clue Cells Wet Prep Whiff POC: NEGATIVE
Trichomonas Wet Prep HPF POC: ABSENT

## 2022-02-02 LAB — POCT GLYCOSYLATED HEMOGLOBIN (HGB A1C): Hemoglobin A1C: 5.5 % (ref 4.0–5.6)

## 2022-02-02 LAB — POCT URINE PREGNANCY: Preg Test, Ur: NEGATIVE

## 2022-02-02 NOTE — Patient Instructions (Addendum)
It was wonderful to meet you today. Thank you for allowing me to be a part of your care. Below is a short summary of what we discussed at your visit today:  I suspect you have spotting could be attributed to the start of your period or sexual intercourse.  When the you are sexually active even some concerns of vaginal irritation we collected sample for STD testing.  Will follow-up with you with the results.  We are checking your A1c for diabetes due to concerns of increased thirst and voiding.  Pregnancy test and UTI were negative today.  Please bring all of your medications to every appointment!  If you have any questions or concerns, please do not hesitate to contact us via phone or MyChart message.   Alen Bleacher, MD [facility]

## 2022-02-02 NOTE — Progress Notes (Signed)
    SUBJECTIVE:   CHIEF COMPLAINT / HPI:   Patient is a 22 year old female presenting today due to concerns of spotting.  LMP 01/10/2022.  She said her spotting started 3 days ago after intercourse and period wasn't supposed to resume for another 3 days. Described spotting as dark brown.  In addition patient reports irritation of her clitoris but denies any vaginal discharge or odor. Tried boric acid but reported worsening irritation prompting her to stop.  Treated for BV about a month ago with metronidazole however no recent antibiotics.  Patient is sexually active with 1 partner and inconsistent with condom use.  Blood glucose monitoring Patient reports she will like to check blood glucose given extensive family history of diabetes.  Patient reports increased polyuria and polydipsia in the last 3 to 4 months.  No nausea or vomiting noted.  PERTINENT  PMH / PSH: Reviewed  OBJECTIVE:   BP 116/64   Pulse 80   Wt 229 lb 12.8 oz (104.2 kg)   LMP 01/10/2022 (Exact Date)   SpO2 99%   BMI 34.94 kg/m    Physical Exam General: Alert, well appearing, NAD Cardiovascular: RRR, No Murmurs, Normal S2/S2 Respiratory: CTAB, No wheezing or Rales Genitalia:  Normal introitus for age, no external lesions, white vaginal discharge without odor, mucosa pink and moist, no vaginal or cervical lesions, no vaginal atrophy, no friaility or hemorrhage, normal uterus size and position   CMA Ivin Booty served as Chaperon for the exam   ASSESSMENT/PLAN:   AUB Patient's history and physical exam is highly suspicious of possible postcoital spotting.  No red flag symptoms and no blood was noted in cervical oz on exam. Pregnancy test was negative. -Obtained pregnancy test -Completed pelvic exam  Vaginal irritation Patient with complain of clitoris irritation. Chart review showed prior chlamydia infection and multiple STD testing in the past 6 month. Patient reports being sexually active with one partner and no use  of condom. Given  multiple STDs and frequent test for STD suspect patient could be indulging in high risk behavior unclear of any underlying reasons at this time. -Follow up with lab for GC, Chlamydia, Trichomonas. -Counsel patient on importance of safe sex practice including risk of infertility -Would need further discussion for PREP therapy  Polyuria and Polydipsia Patient expressed desire to be tested for diabetes because she has had polyuria with polydipsia and extensive family history of diabetes. No prior A1c obtained.  1C today was 5.5. -Obtained POCT A1c. -UA obtained   Alen Bleacher, MD [place]

## 2022-02-03 LAB — CERVICOVAGINAL ANCILLARY ONLY
Chlamydia: NEGATIVE
Comment: NEGATIVE
Comment: NORMAL
Neisseria Gonorrhea: NEGATIVE

## 2022-02-14 ENCOUNTER — Ambulatory Visit (INDEPENDENT_AMBULATORY_CARE_PROVIDER_SITE_OTHER): Payer: Medicaid Other | Admitting: Student

## 2022-02-14 VITALS — BP 111/68 | HR 63 | Ht 68.0 in | Wt 229.2 lb

## 2022-02-14 DIAGNOSIS — R1030 Lower abdominal pain, unspecified: Secondary | ICD-10-CM

## 2022-02-14 DIAGNOSIS — N898 Other specified noninflammatory disorders of vagina: Secondary | ICD-10-CM

## 2022-02-14 DIAGNOSIS — R051 Acute cough: Secondary | ICD-10-CM

## 2022-02-14 HISTORY — DX: Lower abdominal pain, unspecified: R10.30

## 2022-02-14 HISTORY — DX: Acute cough: R05.1

## 2022-02-14 LAB — POCT WET PREP (WET MOUNT)
Clue Cells Wet Prep Whiff POC: NEGATIVE
Trichomonas Wet Prep HPF POC: ABSENT

## 2022-02-14 NOTE — Progress Notes (Unsigned)
    SUBJECTIVE:   CHIEF COMPLAINT / HPI:   Vaginal irritation and Abdominal pain Was seen for spotting and vaginal irritation on 02/02/22 and tested negative for CG and chlamydia. Wet prep only positive for moderate bacteria. She is still having vaginal irritation at the clitoris and inside the vagina. She denies increased discharge or odor. She does wash with baby soap.  Sexually active with 1 partner for the past month, two partners w/in the last  year. Not using condoms, is on birth control.  She would like to be tested for BV today but declines STD testing as she was negative for GC and chlamydia 2 weeks ago and negative for HIV and syphilis this past October. Abdominal pain feels like mild menstrual cramps and thinks it may be related to spotting. Starting spotting on 01/30/22 and then started full blown period on 02/08/22, period stopped yesterday. Did have the abdominal this morning but it has resolved.  Cough  2 days, non productive, no fever, no chills, no SOB, no chest pain.    PERTINENT  PMH / PSH: ***  OBJECTIVE:   BP 111/68   Pulse 63   Ht 5\' 8"  (1.727 m)   Wt 229 lb 4 oz (104 kg)   LMP 02/08/2022 (Exact Date)   SpO2 98%   BMI 34.86 kg/m    General: NAD, pleasant, able to participate in exam Cardiac: RRR, no murmur Respiratory: CTAB, normal effort, No wheezes, rales or rhonchi Abdomen: Bowel sounds present, nontender, nondistended, no hepatosplenomegaly. GU: Chaperoned by CMA.  Normal external female genitalia with no erythema, excoriations, lesions.  Moist pink vaginal mucosa with normal amount of clear/white discharge from the cervical os.  Normal-appearing cervix with no masses or lesions Skin: warm and dry, no rashes noted Neuro: alert, no obvious focal deficits Psych: Normal affect and mood  ASSESSMENT/PLAN:   No problem-specific Assessment & Plan notes found for this encounter.     Dr. Precious Gilding, [place]    {    This will  disappear when note is signed, click to select method of visit    :1}

## 2022-02-14 NOTE — Patient Instructions (Signed)
It was great to see you! Thank you for allowing me to participate in your care!  Our plans for today:  -You tested negative for BV and yeast -I am unsure what is causing your symptoms.  I recommend avoiding using soap in the sensitive areas.  I recommend doing some investigating on your own as to what might be causing this.  You can have irritation in this tissue as it is very sensitive.  Irritation can be caused by things such as laundry detergents, lubricants, anything topical that touches that area or even sexual intercourse. -If symptoms do not resolve, they worsen, or new symptoms develop please return  Take care and seek immediate care sooner if you develop any concerns.   Dr. Precious Gilding, DO [facility]

## 2022-02-14 NOTE — Assessment & Plan Note (Addendum)
Resolved at this time.  Was likely related to LMP as it resolved after her period ended.  Patient advised in the future she can take over-the-counter ibuprofen in the future for abdominal cramps.

## 2022-02-14 NOTE — Assessment & Plan Note (Signed)
Has only been occurring for 2 days with no associated symptoms. -CTM, return if it worsens, other symptoms develop, or does not resolve in the coming weeks

## 2022-02-15 NOTE — Assessment & Plan Note (Signed)
GU exam is normal.  I do not notice any erythema, inflammation, edema, excoriations.  Nothing to suggest yeast or bacterial infections and wet prep is negative.  Advised patient to washing the areas with soap and instead rinse with warm water, avoid any lubricants, lotions, etc.  Advised that this is a very sensitive area, even friction from sexual intercourse could cause irritation.  Advised patient to do some investigating to see if there is anything that could be causing the irritation in her environment.  Will not prescribe any treatment at this time but wait and watch.  Patient advised that if it does not improve or worsens to let me know/return.

## 2022-02-28 ENCOUNTER — Encounter: Payer: Self-pay | Admitting: Student

## 2022-02-28 ENCOUNTER — Other Ambulatory Visit (HOSPITAL_COMMUNITY)
Admission: RE | Admit: 2022-02-28 | Discharge: 2022-02-28 | Disposition: A | Discharge status: Home / Self Care | Payer: Medicaid Other | Source: Ambulatory Visit | Attending: Family Medicine | Admitting: Family Medicine

## 2022-02-28 ENCOUNTER — Ambulatory Visit (INDEPENDENT_AMBULATORY_CARE_PROVIDER_SITE_OTHER): Payer: Medicaid Other | Admitting: Student

## 2022-02-28 VITALS — BP 112/62 | HR 68

## 2022-02-28 DIAGNOSIS — A749 Chlamydial infection, unspecified: Secondary | ICD-10-CM | POA: Diagnosis not present

## 2022-02-28 DIAGNOSIS — N898 Other specified noninflammatory disorders of vagina: Secondary | ICD-10-CM | POA: Insufficient documentation

## 2022-02-28 LAB — POCT URINALYSIS DIP (MANUAL ENTRY)
Bilirubin, UA: NEGATIVE
Blood, UA: NEGATIVE
Glucose, UA: NEGATIVE mg/dL
Leukocytes, UA: NEGATIVE
Nitrite, UA: NEGATIVE
Spec Grav, UA: 1.025 (ref 1.010–1.025)
Urobilinogen, UA: 0.2 E.U./dL
pH, UA: 6 (ref 5.0–8.0)

## 2022-02-28 LAB — POCT WET PREP (WET MOUNT)
Clue Cells Wet Prep Whiff POC: NEGATIVE
Trichomonas Wet Prep HPF POC: ABSENT

## 2022-02-28 MED ORDER — PREMARIN 0.625 MG/GM VA CREA: TOPICAL_CREAM | VAGINAL | 0 refills | Status: DC

## 2022-02-28 NOTE — Patient Instructions (Signed)
It was great seeing you today.  Apply the permarin cream to your irritated area daily for 1 week  We collected testing today- I will call you if it is abnormal.  If you have any questions or concerns, please feel free to call the clinic.    Be well,  Dr. Orvis Brill [facility] [phone]

## 2022-02-28 NOTE — Progress Notes (Signed)
    SUBJECTIVE:   CHIEF COMPLAINT / HPI:   Senora is a 22 year-old female here for vaginal irritation.  She has been seen multiple times for vaginal irritation, notably on 2/1 as well as 2/13. Had negative STI testing on 2/1 for gonorrhea and chlamydia. She has tried miconazole cream for vaginal irritation with some improvement. She says she is currently sexually active with 1 female partner. She has had 2 partners within the last year.  Does not use condoms.  She is on contraceptives.  She says that she is having irritation "around the clitoris" which she also reported on 2/13.  She would like STI testing again today.  LMP 02/08/2022-02/13/2022.  PERTINENT  PMH / PSH: History of chlamydia, BV, vulvar candidiasis  OBJECTIVE:   BP 112/62   Pulse 68   LMP 02/08/2022 (Exact Date)   SpO2 97%   General: Well-appearing, no distress Respiratory: Normal work of breathing on room air GU: Judeen Hammans, CMA present as Producer, television/film/video.  Small mucosal tear noted at the right upper labia in the periurethral area. Otherwise, normal external female genitalia.  Moderate amount of white discharge from cervical os.  Normal-appearing cervix with no masses or lesions.  Cervix is nonfriable. ASSESSMENT/PLAN:   Chlamydia Test results positive for chlamydia. Discussed with patient that we will treat with doxycycline 100 mg x 7 days. Discussed abstaining from sexual intercourse for at least 1 week post treatment completion. Encouraged safe sex practices using condoms or other barrier methods. Also discussed that her partner will need treatment as well, which I sent into the pharmacy after discussing with preceptor.  Vaginal irritation Given his small mucosal tear on labia, prescribed estrogen cream to be used topically once a day for 1 week to help with healing. Otherwise, I believe her symptoms are likely worsened by her acute chlamydia infection.     Orvis Brill, [place]

## 2022-03-01 ENCOUNTER — Encounter: Payer: Self-pay | Admitting: Student

## 2022-03-01 LAB — CERVICOVAGINAL ANCILLARY ONLY
Chlamydia: POSITIVE — AB
Comment: NEGATIVE
Comment: NORMAL
Neisseria Gonorrhea: NEGATIVE

## 2022-03-02 ENCOUNTER — Encounter: Payer: Self-pay | Admitting: Student

## 2022-03-02 DIAGNOSIS — A749 Chlamydial infection, unspecified: Secondary | ICD-10-CM

## 2022-03-02 HISTORY — DX: Chlamydial infection, unspecified: A74.9

## 2022-03-02 MED ORDER — FLUCONAZOLE 150 MG PO TABS: 150.0000 mg | ORAL_TABLET | Freq: Once | ORAL | 0 refills | Status: AC

## 2022-03-02 MED ORDER — DOXYCYCLINE HYCLATE 100 MG PO TABS: 100.0000 mg | ORAL_TABLET | Freq: Two times a day (BID) | ORAL | 0 refills | Status: DC

## 2022-03-02 NOTE — Assessment & Plan Note (Signed)
Test results positive for chlamydia. Discussed with patient that we will treat with doxycycline 100 mg x 7 days. Discussed abstaining from sexual intercourse for at least 1 week post treatment completion. Encouraged safe sex practices using condoms or other barrier methods. Also discussed that her partner will need treatment as well, which I sent into the pharmacy after discussing with preceptor.

## 2022-03-02 NOTE — Assessment & Plan Note (Signed)
Given his small mucosal tear on labia, prescribed estrogen cream to be used topically once a day for 1 week to help with healing. Otherwise, I believe her symptoms are likely worsened by her acute chlamydia infection.

## 2022-03-02 NOTE — Telephone Encounter (Signed)
-----   Message from [place] Pink, [place] sent at 03/02/2022 10:08 AM EST ----- Regarding: STI reporting Positive chlamydia

## 2022-03-20 ENCOUNTER — Ambulatory Visit (INDEPENDENT_AMBULATORY_CARE_PROVIDER_SITE_OTHER): Payer: Medicaid Other | Admitting: Family Medicine

## 2022-03-20 ENCOUNTER — Encounter: Payer: Self-pay | Admitting: Family Medicine

## 2022-03-20 VITALS — BP 118/68 | HR 57 | Ht 68.0 in | Wt 230.0 lb

## 2022-03-20 DIAGNOSIS — N898 Other specified noninflammatory disorders of vagina: Secondary | ICD-10-CM

## 2022-03-20 DIAGNOSIS — R300 Dysuria: Secondary | ICD-10-CM

## 2022-03-20 LAB — POCT URINALYSIS DIP (MANUAL ENTRY)
Bilirubin, UA: NEGATIVE
Blood, UA: NEGATIVE
Glucose, UA: NEGATIVE mg/dL
Ketones, POC UA: NEGATIVE mg/dL
Leukocytes, UA: NEGATIVE
Nitrite, UA: NEGATIVE
Protein Ur, POC: NEGATIVE mg/dL
Spec Grav, UA: 1.02 (ref 1.010–1.025)
Urobilinogen, UA: 0.2 E.U./dL
pH, UA: 7.5 (ref 5.0–8.0)

## 2022-03-20 LAB — POCT WET PREP (WET MOUNT)
Clue Cells Wet Prep Whiff POC: NEGATIVE
Trichomonas Wet Prep HPF POC: ABSENT

## 2022-03-20 NOTE — Patient Instructions (Addendum)
It was wonderful to see you today.  Please bring ALL of your medications with you to every visit.   Today we talked about:  We did a swab to test for yeast infection, bacterial vaginosis, and trichomonas.  I will let you know the results via MyChart. I am unclear what is causing the irritation.  Your urine does not suggest infection. I would recommend washing the area with just water and avoiding any irritants.  -You are due for a Pap smear in November. This is a screening test to check for signs of cervical cancer. Please schedule and appointment to return for this at your earliest convenience.    Thank you for coming to your visit as scheduled. We have had a large "no-show" problem lately, and this significantly limits our ability to see and care for patients. As a friendly reminder- if you cannot make your appointment please call to cancel. We do have a no show policy for those who do not cancel within 24 hours. Our policy is that if you miss or fail to cancel an appointment within 24 hours, 3 times in a 59-month period, you may be dismissed from our clinic.   Thank you for choosing [facility].   Please call [phone] with any questions about today's appointment.  Please be sure to schedule follow up at the front  desk before you leave today.   Sharion Settler, DO PGY-3 Family Medicine

## 2022-03-20 NOTE — Progress Notes (Signed)
    SUBJECTIVE:   CHIEF COMPLAINT / HPI:   Kristen Dean is a 22 y.o. female who presents to the [facility] clinic today to discuss the following concerns:   Urinary symptoms Onset: 5 days, started after period  Dysuria: present Increased urinary frequency: No  Urinary urgency: Slightly but not really new sx for her (has had since she had kids) Incontinence: No  Abdominal pain: No  Hematuria: No  Nausea/vomiting: No  Flank pain: Yes , left side Fever: No  Has not tried any medications.   She was last seen in clinic on 2/27, tested positive for chlamydia at that time.  She was prescribed doxycycline for 7 days. She states that the dysuria started after she completed abx.   Denies any vaginal discharge. She continues to endorse some vaginal irritation over her clitoris. She was previously prescribed estrogen cream for this which she used but did not get any relief.   PERTINENT  PMH / PSH: BV, recent chlamydia 2/27  OBJECTIVE:   BP 118/68   Pulse (!) 57   Ht 5\' 8"  (1.727 m)   Wt 230 lb (104.3 kg)   LMP 03/09/2022   SpO2 99%   BMI 34.97 kg/m    General: NAD, pleasant, able to participate in exam Respiratory: normal effort GU: Normal appearance of labia majora and minora, without lesions.No labial tear appreciated. Vagina tissue pink, moist, without lesions or abrasions. Cervix normal appearance, non-friable, with clear discharge from os.  Back: No CVAT b/l Psych: Normal affect and mood  Jazmin Hartsell, CMA, chaperoned GU examination   ASSESSMENT/PLAN:  1. Dysuria Intermittent. History not consistent with UTI and UA is reassuring. Have sent Ucx to rule out UTI but have low suspicion for this. Suspect intermittent dysuria is related to her vaginal irritation/possible healing tear (see below)  - POCT urinalysis dipstick - Urine Culture  2. Vaginal irritation Ongoing since last visit. Labial tear visualized at last office visit but not appreciated today. Could be healing  and her irritation/intermittent dysuria could be a result of this.  - Supportive care, avoid washing area with rag  - POCT Wet Prep [facility]) returned negatiev - If no improvement can consider short course of topical steroid ointment      Sharion Settler, [place]

## 2022-03-22 LAB — URINE CULTURE: Organism ID, Bacteria: NO GROWTH

## 2022-05-03 ENCOUNTER — Ambulatory Visit
Admission: RE | Admit: 2022-05-03 | Discharge: 2022-05-03 | Disposition: A | Discharge status: Home / Self Care | Payer: Medicaid Other | Source: Ambulatory Visit | Attending: Urgent Care | Admitting: Urgent Care

## 2022-05-03 VITALS — BP 124/85 | HR 78 | Temp 98.3°F | Resp 20

## 2022-05-03 DIAGNOSIS — N898 Other specified noninflammatory disorders of vagina: Secondary | ICD-10-CM | POA: Insufficient documentation

## 2022-05-03 DIAGNOSIS — N760 Acute vaginitis: Secondary | ICD-10-CM | POA: Diagnosis present

## 2022-05-03 LAB — POCT URINALYSIS DIP (MANUAL ENTRY)
Bilirubin, UA: NEGATIVE
Blood, UA: NEGATIVE
Glucose, UA: NEGATIVE mg/dL
Ketones, POC UA: NEGATIVE mg/dL
Leukocytes, UA: NEGATIVE
Nitrite, UA: NEGATIVE
Protein Ur, POC: NEGATIVE mg/dL
Spec Grav, UA: 1.025 (ref 1.010–1.025)
Urobilinogen, UA: 0.2 E.U./dL
pH, UA: 7 (ref 5.0–8.0)

## 2022-05-03 LAB — POCT URINE PREGNANCY: Preg Test, Ur: NEGATIVE

## 2022-05-03 MED ORDER — FLUCONAZOLE 150 MG PO TABS: 150.0000 mg | ORAL_TABLET | ORAL | 0 refills | Status: DC

## 2022-05-03 NOTE — ED Provider Notes (Signed)
[facility] - URGENT CARE CENTER  Note:  This document was prepared using Conservation officer, historic buildings and may include unintentional dictation errors.  MRN: [id number] DOB: 19-Feb-2000  Subjective:   Kristen Dean is a 22 y.o. female presenting for 2-3 week history of persistent vaginal discharge, itching. Would like to be checked for an urinary tract infection, STI testing. Last urine culture from 03/20/2022 was negative. No antibiotics used. She did get treatment for doxycycline in February 2024. Does not hydrate with water. Does sugar free juice. No alcohol, coffee, energy drinks.   No current facility-administered medications for this encounter.  Current Outpatient Medications:    conjugated estrogens (PREMARIN) vaginal cream, Apply small amount to irritated area daily for 1 week., Disp: 30 g, Rfl: 0   doxycycline (VIBRA-TABS) 100 MG tablet, Take 1 tablet (100 mg total) by mouth 2 (two) times daily., Disp: 14 tablet, Rfl: 0   Norgestimate-Ethinyl Estradiol Triphasic (TRI-LO-SPRINTEC) 0.18/0.215/0.25 MG-25 MCG tab, Take 1 tablet by mouth daily., Disp: 84 tablet, Rfl: 3   Allergies  Allergen Reactions   Latex Itching    Past Medical History:  Diagnosis Date   Chest pain, unspecified 11/11/2019   GERD (gastroesophageal reflux disease) 08/08/2017   Medical history non-contributory    Nausea and vomiting 02/16/2017   Screening for STDs (sexually transmitted diseases) 04/26/2021   Tension headache 06/16/2020     Past Surgical History:  Procedure Laterality Date   NO PAST SURGERIES      Family History  Problem Relation Age of Onset   Diabetes Mother    Hypertension Mother    Diabetes Sister    Mental retardation Sister    Heart disease Maternal Aunt    Diabetes Maternal Aunt    Diabetes Maternal Uncle    Diabetes Maternal Grandmother    Heart disease Maternal Grandfather    Diabetes Maternal Grandfather    Colon cancer Neg Hx    Esophageal cancer Neg Hx     Pancreatic cancer Neg Hx    Stomach cancer Neg Hx     Social History   Tobacco Use   Smoking status: Never   Smokeless tobacco: Never  Vaping Use   Vaping Use: Never used  Substance Use Topics   Alcohol use: Not Currently   Drug use: No    ROS   Objective:   Vitals: BP 124/85 (BP Location: Left Arm)   Pulse 78   Temp 98.3 F (36.8 C)   Resp 20   LMP 04/05/2022   SpO2 98%   Physical Exam Constitutional:      General: She is not in acute distress.    Appearance: Normal appearance. She is well-developed. She is not ill-appearing, toxic-appearing or diaphoretic.  HENT:     Head: Normocephalic and atraumatic.     Nose: Nose normal.     Mouth/Throat:     Mouth: Mucous membranes are moist.     Pharynx: Oropharynx is clear.  Eyes:     General: No scleral icterus.       Right eye: No discharge.        Left eye: No discharge.     Extraocular Movements: Extraocular movements intact.     Conjunctiva/sclera: Conjunctivae normal.  Cardiovascular:     Rate and Rhythm: Normal rate.  Pulmonary:     Effort: Pulmonary effort is normal.  Abdominal:     General: Bowel sounds are normal. There is no distension.     Palpations: Abdomen is soft. There is no  mass.     Tenderness: There is no abdominal tenderness. There is no right CVA tenderness, left CVA tenderness, guarding or rebound.  Skin:    General: Skin is warm and dry.  Neurological:     General: No focal deficit present.     Mental Status: She is alert and oriented to person, place, and time.  Psychiatric:        Mood and Affect: Mood normal.        Behavior: Behavior normal.        Thought Content: Thought content normal.        Judgment: Judgment normal.    Results for orders placed or performed during the hospital encounter of 05/03/22 (from the past 24 hour(s))  POCT urine pregnancy     Status: None   Collection Time: 05/03/22  1:11 PM  Result Value Ref Range   Preg Test, Ur Negative Negative  POCT  urinalysis dipstick     Status: None   Collection Time: 05/03/22  1:11 PM  Result Value Ref Range   Color, UA yellow yellow   Clarity, UA clear clear   Glucose, UA negative negative mg/dL   Bilirubin, UA negative negative   Ketones, POC UA negative negative mg/dL   Spec Grav, UA 8.469 6.295 - 1.025   Blood, UA negative negative   pH, UA 7.0 5.0 - 8.0   Protein Ur, POC negative negative mg/dL   Urobilinogen, UA 0.2 0.2 or 1.0 E.U./dL   Nitrite, UA Negative Negative   Leukocytes, UA Negative Negative    Assessment and Plan :   PDMP not reviewed this encounter.  1. Acute vaginitis   2. Vaginal itching    Will defer urine culture given lack of urinary symptoms, completely normal urinalysis and recent negative urine culture. Will treat patient empirically for yeast vaginitis with fluconazole.  Labs pending. Counseled patient on potential for adverse effects with medications prescribed/recommended today, ER and return-to-clinic precautions discussed, patient verbalized understanding.    Wallis Bamberg, PA-C 05/03/22 1326

## 2022-05-03 NOTE — ED Triage Notes (Signed)
Pt c/o vaginal d/c and itching since 4/18-states she took a pill at home for yeast infection-pt requesting STD and UTI screening-NAD-steady gait

## 2022-05-03 NOTE — Discharge Instructions (Signed)
Please start fluconazole to address a yeast infection. Make sure you hydrate very well with plain water and a quantity of 80 ounces of water a day.  Please limit drinks that are considered urinary irritants such as soda, sweet tea, coffee, energy drinks, alcohol.  These can worsen your urinary and genital symptoms but also be the source of them.  I will let you know about your vaginal swab results through MyChart to see if we need to prescribe or change your antibiotics based off of those results.  

## 2022-05-04 LAB — CERVICOVAGINAL ANCILLARY ONLY
Bacterial Vaginitis (gardnerella): NEGATIVE
Candida Glabrata: NEGATIVE
Candida Vaginitis: NEGATIVE
Chlamydia: NEGATIVE
Comment: NEGATIVE
Comment: NEGATIVE
Comment: NEGATIVE
Comment: NEGATIVE
Comment: NEGATIVE
Comment: NORMAL
Neisseria Gonorrhea: NEGATIVE
Trichomonas: NEGATIVE

## 2022-05-23 ENCOUNTER — Other Ambulatory Visit (HOSPITAL_COMMUNITY)
Admission: RE | Admit: 2022-05-23 | Discharge: 2022-05-23 | Disposition: A | Discharge status: Home / Self Care | Payer: Medicaid Other | Source: Ambulatory Visit | Attending: Family Medicine | Admitting: Family Medicine

## 2022-05-23 ENCOUNTER — Other Ambulatory Visit: Payer: Self-pay

## 2022-05-23 ENCOUNTER — Ambulatory Visit (INDEPENDENT_AMBULATORY_CARE_PROVIDER_SITE_OTHER): Payer: Medicaid Other | Admitting: Family Medicine

## 2022-05-23 VITALS — BP 128/76 | HR 75 | Ht 68.0 in | Wt 238.0 lb

## 2022-05-23 DIAGNOSIS — N898 Other specified noninflammatory disorders of vagina: Secondary | ICD-10-CM

## 2022-05-23 DIAGNOSIS — Z113 Encounter for screening for infections with a predominantly sexual mode of transmission: Secondary | ICD-10-CM | POA: Diagnosis not present

## 2022-05-23 DIAGNOSIS — Z6836 Body mass index (BMI) 36.0-36.9, adult: Secondary | ICD-10-CM | POA: Diagnosis not present

## 2022-05-23 DIAGNOSIS — E669 Obesity, unspecified: Secondary | ICD-10-CM

## 2022-05-23 LAB — POCT WET PREP (WET MOUNT)
Clue Cells Wet Prep Whiff POC: NEGATIVE
Trichomonas Wet Prep HPF POC: ABSENT

## 2022-05-23 NOTE — Patient Instructions (Addendum)
It was great seeing you today!  You were seen for vaginal irritation and you were negative for yeast and BV. STI testing still pending. We are checking blood work as well and I will call you if anything is abnormal  I do recommend using fragrance free mild soap and changing wash cloth with each wash. You can use vaseline on the area of irritation to provide a barrier. Please let us know if after a week you still experience irritation  Feel free to call with any questions or concerns at any time, at [phone].  You can schedule an appointment if you would like to discuss weight loss further.    Take care,  Dr. Cora Collum [facility]

## 2022-05-23 NOTE — Progress Notes (Signed)
    SUBJECTIVE:   CHIEF COMPLAINT / HPI:   Vaginal Discharge: Patient is a 22 y.o. female presenting with vaginal irritation for about 2 days.  Denies increased discharge or urinary symptoms. Sexually active with the same partner. She is interested in screening for sexually transmitted infections today. Currently on OCPs for birth control   PERTINENT  PMH / PSH: Reviewed   OBJECTIVE:   LMP 04/05/2022    General: NAD, pleasant, able to participate in exam Respiratory: Normal effort, no obvious respiratory distress Pelvic: VULVA: normal appearing vulva with no masses, tenderness or lesions, VAGINA: Normal appearing vagina with normal color, no lesions, with scant and clear discharge present, CERVIX: No lesions, scant and clear discharge present  Chaperone Deseree Blount present for pelvic exam  ASSESSMENT/PLAN:   No problem-specific Assessment & Plan notes found for this encounter.    Assessment:  22 y.o. female with vaginal irritation for 2 days.  Physical exam unremarkable. Did not visualize a laceration or lesions. Wet prep performed today was normal.  Patient is interested in STI screening.   Plan: -GC/chlamydia pending -Will check HIV and RPR - TSH for concern of weight gain   Cora Collum, DO [facility]

## 2022-05-24 LAB — CERVICOVAGINAL ANCILLARY ONLY
Chlamydia: NEGATIVE
Comment: NEGATIVE
Comment: NORMAL
Neisseria Gonorrhea: NEGATIVE

## 2022-05-26 LAB — TREPONEMAL ANTIBODIES, TPPA: Treponemal Antibodies, TPPA: NONREACTIVE

## 2022-05-26 LAB — TSH RFX ON ABNORMAL TO FREE T4: TSH: 1.47 u[IU]/mL (ref 0.450–4.500)

## 2022-05-26 LAB — RPR W/REFLEX TO TREPSURE: RPR: NONREACTIVE

## 2022-05-26 LAB — HIV ANTIBODY (ROUTINE TESTING W REFLEX): HIV Screen 4th Generation wRfx: NONREACTIVE

## 2022-06-23 ENCOUNTER — Ambulatory Visit (INDEPENDENT_AMBULATORY_CARE_PROVIDER_SITE_OTHER): Payer: Medicaid Other

## 2022-06-23 ENCOUNTER — Ambulatory Visit: Payer: Medicaid Other

## 2022-06-23 ENCOUNTER — Ambulatory Visit
Admission: EM | Admit: 2022-06-23 | Discharge: 2022-06-23 | Disposition: A | Discharge status: Home / Self Care | Payer: Medicaid Other | Attending: Internal Medicine | Admitting: Internal Medicine

## 2022-06-23 DIAGNOSIS — S93401A Sprain of unspecified ligament of right ankle, initial encounter: Secondary | ICD-10-CM

## 2022-06-23 MED ORDER — IBUPROFEN 600 MG PO TABS: 600.0000 mg | ORAL_TABLET | Freq: Three times a day (TID) | ORAL | 0 refills | Status: DC | PRN

## 2022-06-23 NOTE — ED Provider Notes (Signed)
[facility]    CSN: [id number] Arrival date & time: 06/23/22  1122      History   Chief Complaint Chief Complaint  Patient presents with   Ankle Pain    HPI Kristen Dean is a 22 y.o. female.   HPI Patient is today for evaluation of right ankle pain.  Patient reports that she was walking and subsequently twisted her ankle on concrete resulted in a fall and she has an abrasion on the outside of her right foot.  Patient reports that simply losing her eating with ambulation.  She immediately experienced swelling and pain after fall which has been.  She is concerned for possible fracture.  Denies  any previous fractures involving the right ankle. 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/02/2020   Generalized abdominal pain 08/04/2020   Tension headache 06/16/2020   Duct ectasia of breast, 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   Chest pain, unspecified 11/11/2019   GERD (gastroesophageal reflux disease) 08/08/2017   Nausea and vomiting 02/16/2017   Vaginal irritation 11/09/2016   Vitamin D deficiency 02/21/2015   Learning disability 01/27/2013    Past Surgical History:  Procedure Laterality Date   NO PAST SURGERIES      OB History     Gravida  4   Para  2   Term  2   Preterm      AB  2   Living  2      SAB  2   IAB      Ectopic      Multiple  0   Live Births  2        Obstetric Comments  Pregnancy #2 Baby Boy,  Vit D Deficiency, Fetal Pyelectasis that resolved          Home Medications    Prior to Admission medications   Medication Sig Start Date End Date Taking? Authorizing Provider  ibuprofen (ADVIL) 600 MG tablet Take 1 tablet (600 mg total) by mouth every 8 (eight) hours as needed for mild pain or moderate pain. 06/23/22  Yes Bing Neighbors, NP  conjugated estrogens (PREMARIN) vaginal cream Apply small amount to irritated area daily for 1 week. 02/28/22   Dameron, Nolberto Hanlon, DO  Norgestimate-Ethinyl Estradiol Triphasic (TRI-LO-SPRINTEC) 0.18/0.215/0.25 MG-25 MCG tab Take 1 tablet by mouth daily. 06/26/22   Maury Dus, MD    Family History Family History  Problem Relation Age of Onset   Diabetes Mother    Hypertension Mother    Diabetes Sister    Mental retardation Sister    Heart disease Maternal Aunt    Diabetes Maternal Aunt    Diabetes Maternal Uncle    Diabetes Maternal Grandmother    Heart disease Maternal Grandfather    Diabetes Maternal Grandfather    Colon cancer Neg Hx    Esophageal cancer Neg Hx    Pancreatic cancer Neg Hx    Stomach cancer Neg Hx     Social History Social History  Tobacco Use   Smoking status: Never   Smokeless tobacco: Never  Vaping Use   Vaping Use: Never used  Substance Use Topics   Alcohol use: Not Currently   Drug use: No     Allergies   Latex   Review of Systems Review of Systems Pertinent negatives listed in HPI   Physical Exam Triage Vital Signs ED Triage Vitals  Enc Vitals Group     BP 06/23/22 1146 108/69     Pulse Rate 06/23/22 1146 67     Resp 06/23/22 1146 16     Temp 06/23/22 1146 98.4 F (36.9 C)     Temp Source 06/23/22 1146 Oral     SpO2 06/23/22 1146 98 %     Weight --      Height --      Head Circumference --      Peak Flow --      Pain Score 06/23/22 1147 7     Pain Loc --      Pain Edu? --      Excl. in GC? --    No data found.  Updated Vital Signs BP 108/69 (BP Location: Left Arm)    Pulse 67   Temp 98.4 F (36.9 C) (Oral)   Resp 16   LMP 06/02/2022 (Exact Date)   SpO2 98%   Visual Acuity Right Eye Distance:   Left Eye Distance:   Bilateral Distance:    Right Eye Near:   Left Eye Near:    Bilateral Near:     Physical Exam Vitals reviewed.  Constitutional:      Appearance: Normal appearance.  Eyes:     Extraocular Movements: Extraocular movements intact.     Pupils: Pupils are equal, round, and reactive to light.  Cardiovascular:     Rate and Rhythm: Normal rate and regular rhythm.  Pulmonary:     Effort: Pulmonary effort is normal.     Breath sounds: Normal breath sounds.  Musculoskeletal:     Cervical back: Normal range of motion and neck supple.     Right ankle: Swelling present. Tenderness present over the lateral malleolus and medial malleolus. Decreased range of motion.       Feet:  Neurological:     General: No focal deficit present.     Mental Status: She is alert.      UC Treatments / Results  Labs (all labs ordered are listed, but only abnormal results are displayed) Labs Reviewed - No data to display  EKG   Radiology No results found.  Procedures Procedures (including critical care time)  Medications Ordered in UC Medications - No data to display  Initial Impression / Assessment and Plan / UC Course  I have reviewed the triage vital signs and the nursing notes.  Pertinent labs & imaging results that were available during my care of the patient were reviewed by me and considered in my medical decision making (see chart for details).    Imaging negative for any acute fracture or dislocation.  Patient placed in a Ace wrap and advised to continue to wear for comfort.  Elevate and apply ice until swelling resolves.  Take ibuprofen 600 mg up to 3 times daily as needed for pain.  Symptoms worsen or do not improve follow-up with [facility] for further workup and evaluation of ankle pain Final Clinical Impressions(s) / UC  Diagnoses   Final diagnoses:  Sprain of right ankle, unspecified ligament, initial encounter   Discharge Instructions  None    ED Prescriptions     Medication Sig Dispense Auth. Provider   ibuprofen (ADVIL) 600 MG tablet Take 1 tablet (600 mg total) by mouth every 8 (eight) hours as needed for mild pain or moderate pain. 30 tablet Bing Neighbors, NP      PDMP not reviewed this encounter.   Bing Neighbors, NP 06/28/22 1005

## 2022-06-23 NOTE — ED Triage Notes (Signed)
Patient states she twisted her ankle on concrete today. Patient with pain to right ankle and abrasion to outside of right foot.

## 2022-06-26 ENCOUNTER — Ambulatory Visit (INDEPENDENT_AMBULATORY_CARE_PROVIDER_SITE_OTHER): Payer: Medicaid Other | Admitting: Family Medicine

## 2022-06-26 ENCOUNTER — Encounter: Payer: Self-pay | Admitting: Family Medicine

## 2022-06-26 VITALS — BP 124/75 | HR 80 | Wt 243.0 lb

## 2022-06-26 DIAGNOSIS — Z3041 Encounter for surveillance of contraceptive pills: Secondary | ICD-10-CM | POA: Diagnosis not present

## 2022-06-26 DIAGNOSIS — N898 Other specified noninflammatory disorders of vagina: Secondary | ICD-10-CM

## 2022-06-26 LAB — POCT WET PREP (WET MOUNT)
Clue Cells Wet Prep Whiff POC: NEGATIVE
Trichomonas Wet Prep HPF POC: ABSENT

## 2022-06-26 LAB — POCT URINE PREGNANCY: Preg Test, Ur: NEGATIVE

## 2022-06-26 MED ORDER — NORGESTIM-ETH ESTRAD TRIPHASIC 0.18/0.215/0.25 MG-25 MCG PO TABS: 1.0000 | ORAL_TABLET | Freq: Every day | ORAL | 3 refills | Status: DC

## 2022-06-26 NOTE — Patient Instructions (Addendum)
It was great to see you!  Things we discussed at today's visit: - Your pregnancy test was negative - I sent refills on your birth control pills - Your testing was negative for yeast and BV - If you have recurrent irritation or other concerns please let us know  Schedule a separate appt with Dr Yetta Barre to discuss weight   Take care  Dr. [facility]

## 2022-06-26 NOTE — Progress Notes (Signed)
    SUBJECTIVE:   CHIEF COMPLAINT / HPI:   Vaginal Odor -noticed it about a week ago -has resolved since then -wants to be checked for BV -no vaginal discharge -no urinary symptoms -sexually active, recently tested for STIs, no new partners -on OCPs for contraception, would like refill -uses vaginal estrogen cream as needed for vaginal irritation   PERTINENT  PMH / PSH: obesity, h/o chlamydia  OBJECTIVE:   BP 124/75 (BP Location: Left Arm, Patient Position: Sitting, Cuff Size: Normal)   Pulse 80   Wt 243 lb (110.2 kg)   LMP 06/02/2022 (Exact Date)   SpO2 100%   BMI 36.95 kg/m   General: NAD, pleasant, able to participate in exam Respiratory: No respiratory distress Skin: warm and dry, no rashes noted Psych: Normal affect and mood Neuro: grossly intact GU/GYN: Exam performed in the presence of a chaperone. External genitalia within normal limits.  Vaginal mucosa pink, moist, normal rugae.  Nonfriable cervix without lesions, no discharge or bleeding noted on speculum exam.     ASSESSMENT/PLAN:   Vaginal Odor Symptoms have now resolved. Wet prep negative for BV, yeast, trich. Reassurance provided. Return if recurrent or new concerns arise.  Contraception Management Upreg negative. Refill sent on OCPs as requested.  Maury Dus, MD [facility]

## 2022-09-24 ENCOUNTER — Ambulatory Visit
Admission: RE | Admit: 2022-09-24 | Discharge: 2022-09-24 | Disposition: A | Discharge status: Home / Self Care | Payer: MEDICAID | Source: Ambulatory Visit | Attending: Internal Medicine | Admitting: Internal Medicine

## 2022-09-24 VITALS — BP 118/75 | HR 71 | Temp 98.8°F | Resp 14 | Ht 68.0 in | Wt 240.0 lb

## 2022-09-24 DIAGNOSIS — Z113 Encounter for screening for infections with a predominantly sexual mode of transmission: Secondary | ICD-10-CM | POA: Diagnosis present

## 2022-09-24 DIAGNOSIS — N939 Abnormal uterine and vaginal bleeding, unspecified: Secondary | ICD-10-CM | POA: Diagnosis not present

## 2022-09-24 LAB — POCT URINALYSIS DIP (MANUAL ENTRY)
Bilirubin, UA: NEGATIVE
Glucose, UA: NEGATIVE mg/dL
Ketones, POC UA: NEGATIVE mg/dL
Leukocytes, UA: NEGATIVE
Nitrite, UA: NEGATIVE
Protein Ur, POC: NEGATIVE mg/dL
Spec Grav, UA: 1.025 (ref 1.010–1.025)
Urobilinogen, UA: 0.2 E.U./dL
pH, UA: 7 (ref 5.0–8.0)

## 2022-09-24 LAB — POCT URINE PREGNANCY: Preg Test, Ur: NEGATIVE

## 2022-09-24 NOTE — ED Triage Notes (Signed)
Patient here today with c/o vaginal bleeding and cramping that started yesterday. Patient states that her period came early this month and could have came on in 6 six from now. She would like to be tested for STDs today. Last birth control pill taken was last Tuesday.

## 2022-09-24 NOTE — Discharge Instructions (Addendum)
The clinic will contact you with results of the testing done today if positive.  Please follow-up with your PCP for recheck next week.  Please go to the ER for any worsening symptoms.  I hope you feel better soon!

## 2022-09-24 NOTE — ED Provider Notes (Signed)
[facility]    CSN: [id number] Arrival date & time: 09/24/22  1221      History   Chief Complaint Chief Complaint  Patient presents with   Vaginal Bleeding    Period came on 7 days earlier want to get checked for stds and bv / yeast - Entered by patient    HPI Kristen Dean is a 22 y.o. female presents for STD testing.  Patient reports her menses came early by 1 week this month.  She is on oral birth control but states she missed 4 days while out of town.  She would like STD screening but denies any known exposure and also denies any abnormal vaginal discharge, dysuria, fevers, nausea/vomiting, flank pain.  She has no other concerns at this time.   Vaginal Bleeding   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/02/2020   Generalized abdominal pain 08/04/2020   Tension headache 06/16/2020   Duct ectasia of breast, 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   Chest pain, unspecified 11/11/2019   GERD (gastroesophageal reflux disease) 08/08/2017   Nausea and vomiting 02/16/2017   Vaginal irritation 11/09/2016   Vitamin D deficiency 02/21/2015   Learning disability 01/27/2013    Past Surgical History:  Procedure Laterality Date   NO PAST SURGERIES      OB History     Gravida  4   Para  2   Term  2   Preterm      AB  2   Living  2      SAB  2   IAB      Ectopic      Multiple  0   Live Births  2        Obstetric Comments   Pregnancy #2 Baby Boy, Vit D Deficiency, Fetal Pyelectasis that resolved          Home Medications    Prior to Admission medications   Medication Sig Start Date End Date Taking? Authorizing Provider  conjugated estrogens (PREMARIN) vaginal cream Apply small amount to irritated area daily for 1 week. 02/28/22   Dameron, Nolberto Hanlon, DO  ibuprofen (ADVIL) 600 MG tablet Take 1 tablet (600 mg total) by mouth every 8 (eight) hours as needed for mild pain or moderate pain. 06/23/22   Bing Neighbors, NP  Norgestimate-Ethinyl Estradiol Triphasic (TRI-LO-SPRINTEC) 0.18/0.215/0.25 MG-25 MCG tab Take 1 tablet by mouth daily. 06/26/22   Maury Dus, MD    Family History Family History  Problem Relation Age of Onset   Diabetes Mother    Hypertension Mother    Diabetes Sister    Mental retardation Sister    Heart disease Maternal Aunt    Diabetes Maternal Aunt    Diabetes Maternal Uncle    Diabetes Maternal Grandmother    Heart disease Maternal Grandfather    Diabetes Maternal Grandfather    Colon cancer Neg Hx    Esophageal cancer Neg Hx    Pancreatic cancer Neg Hx    Stomach  cancer Neg Hx     Social History Social History   Tobacco Use   Smoking status: Never   Smokeless tobacco: Never  Vaping Use   Vaping status: Never Used  Substance Use Topics   Alcohol use: Not Currently   Drug use: No     Allergies   Latex   Review of Systems Review of Systems  Genitourinary:        STD screen     Physical Exam Triage Vital Signs ED Triage Vitals  Encounter Vitals Group     BP 09/24/22 1305 118/75     Systolic BP Percentile --      Diastolic BP Percentile --      Pulse Rate 09/24/22 1305 71     Resp 09/24/22 1305 14     Temp 09/24/22 1305 98.8 F (37.1 C)     Temp Source 09/24/22 1305 Oral     SpO2 09/24/22 1305 97 %     Weight 09/24/22 1319 240 lb (108.9 kg)     Height 09/24/22 1319 5\' 8"  (1.727 m)     Head Circumference --      Peak Flow --      Pain Score  09/24/22 1318 4     Pain Loc --      Pain Education --      Exclude from Growth Chart --    No data found.  Updated Vital Signs BP 118/75 (BP Location: Right Arm)   Pulse 71   Temp 98.8 F (37.1 C) (Oral)   Resp 14   Ht 5\' 8"  (1.727 m)   Wt 240 lb (108.9 kg)   LMP 09/23/2022 (Exact Date)   SpO2 97%   BMI 36.49 kg/m   Visual Acuity Right Eye Distance:   Left Eye Distance:   Bilateral Distance:    Right Eye Near:   Left Eye Near:    Bilateral Near:     Physical Exam Vitals and nursing note reviewed.  Constitutional:      Appearance: Normal appearance.  HENT:     Head: Normocephalic and atraumatic.  Eyes:     Pupils: Pupils are equal, round, and reactive to light.  Cardiovascular:     Rate and Rhythm: Normal rate.  Pulmonary:     Effort: Pulmonary effort is normal.  Abdominal:     Tenderness: There is no right CVA tenderness or left CVA tenderness.  Skin:    General: Skin is warm and dry.  Neurological:     General: No focal deficit present.     Mental Status: She is alert and oriented to person, place, and time.  Psychiatric:        Mood and Affect: Mood normal.        Behavior: Behavior normal.      UC Treatments / Results  Labs (all labs ordered are listed, but only abnormal results are displayed) Labs Reviewed  RPR  HIV ANTIBODY (ROUTINE TESTING W REFLEX)  POCT URINALYSIS DIP (MANUAL ENTRY)  POCT URINE PREGNANCY  CERVICOVAGINAL ANCILLARY ONLY    EKG   Radiology No results found.  Procedures Procedures (including critical care time)  Medications Ordered in UC Medications - No data to display  Initial Impression / Assessment and Plan / UC Course  I have reviewed the triage vital signs and the nursing notes.  Pertinent labs & imaging results that were available during my care of the patient were reviewed by me and considered in my medical decision making (  see chart for details).     STD testing is ordered and will contact for any  positive results.  UA and hCG negative.  Discussed early onset of menses secondary to missing her birth control for several days.  Advised GYN or PCP follow-up next week for recheck.  ER precautions reviewed and patient verbalized understanding. Final Clinical Impressions(s) / UC Diagnoses   Final diagnoses:  Screening examination for STD (sexually transmitted disease)     Discharge Instructions      The clinic will contact you with results of the testing done today if positive.  Please follow-up with your PCP for recheck next week.  Please go to the ER for any worsening symptoms.  I hope you feel better soon!    ED Prescriptions   None    PDMP not reviewed this encounter.   Radford Pax, NP 09/24/22 1330

## 2022-09-25 ENCOUNTER — Telehealth: Payer: Self-pay

## 2022-09-25 LAB — CERVICOVAGINAL ANCILLARY ONLY
Bacterial Vaginitis (gardnerella): POSITIVE — AB
Candida Glabrata: NEGATIVE
Candida Vaginitis: NEGATIVE
Chlamydia: NEGATIVE
Comment: NEGATIVE
Comment: NEGATIVE
Comment: NEGATIVE
Comment: NEGATIVE
Comment: NEGATIVE
Comment: NORMAL
Neisseria Gonorrhea: NEGATIVE
Trichomonas: NEGATIVE

## 2022-09-25 MED ORDER — METRONIDAZOLE 500 MG PO TABS: 500.0000 mg | ORAL_TABLET | Freq: Two times a day (BID) | ORAL | 0 refills | Status: AC

## 2022-09-25 NOTE — Telephone Encounter (Signed)
Per protocol, pt requires tx with metronidazole. Rx sent to pharmacy on file.

## 2022-10-05 ENCOUNTER — Telehealth: Payer: Self-pay

## 2022-10-05 LAB — SPECIMEN STATUS REPORT

## 2022-10-05 LAB — HIV ANTIBODY (ROUTINE TESTING W REFLEX)

## 2022-10-05 LAB — RPR: RPR Ser Ql: NONREACTIVE

## 2022-10-05 NOTE — Telephone Encounter (Signed)
Keana from [facility] called and stated "there was a processing error in the lab" and the HIV blood work needs to be recollect.    Tried calling patient but was sent to Voicemail. Left a message stating to please call back.

## 2022-10-11 ENCOUNTER — Ambulatory Visit
Admission: EM | Admit: 2022-10-11 | Discharge: 2022-10-11 | Disposition: A | Discharge status: Home / Self Care | Payer: MEDICAID | Attending: Internal Medicine | Admitting: Internal Medicine

## 2022-10-11 DIAGNOSIS — Z114 Encounter for screening for human immunodeficiency virus [HIV]: Secondary | ICD-10-CM | POA: Diagnosis not present

## 2022-10-11 DIAGNOSIS — N760 Acute vaginitis: Secondary | ICD-10-CM | POA: Diagnosis present

## 2022-10-11 DIAGNOSIS — Z113 Encounter for screening for infections with a predominantly sexual mode of transmission: Secondary | ICD-10-CM | POA: Diagnosis not present

## 2022-10-11 LAB — POCT URINALYSIS DIP (MANUAL ENTRY)
Bilirubin, UA: NEGATIVE
Blood, UA: NEGATIVE
Glucose, UA: NEGATIVE mg/dL
Ketones, POC UA: NEGATIVE mg/dL
Leukocytes, UA: NEGATIVE
Nitrite, UA: NEGATIVE
Protein Ur, POC: NEGATIVE mg/dL
Spec Grav, UA: 1.02 (ref 1.010–1.025)
Urobilinogen, UA: 0.2 U/dL
pH, UA: 8 (ref 5.0–8.0)

## 2022-10-11 LAB — POCT URINE PREGNANCY: Preg Test, Ur: NEGATIVE

## 2022-10-11 MED ORDER — FLUCONAZOLE 150 MG PO TABS
150.0000 mg | ORAL_TABLET | Freq: Every day | ORAL | 0 refills | Status: AC
Start: 2022-10-11 — End: 2022-10-12

## 2022-10-11 NOTE — ED Triage Notes (Signed)
Pt presents with vaginal discharge, burning with urination, itching and irritation that started 3 days ago. Pt recently got done with treatment for B 2 weeks ago. Pt is also here to have her blood work re-drawn labs say process error.

## 2022-10-11 NOTE — ED Provider Notes (Signed)
[facility]    CSN: [id number] Arrival date & time: 10/11/22  1004      History   Chief Complaint Chief Complaint  Patient presents with   Vaginal Discharge    HPI Kristen Dean is a 22 y.o. female presents for vaginal discharge.  Patient reports 3 days of a pruritic malodorous vaginal discharge consistent with previous yeast infection symptoms that she is on.  She was treated for BV 2 weeks ago with Flagyl with resolution of the symptoms.  Reports dysuria only when "the urine hits the lips" otherwise no urgency or frequency.  No hematuria, fevers, nausea/vomiting, flank pain.  No STD exposure but would like screening.  She was told she needed to repeat her blood work for  HIV due to processing error at her last visit.  No other concerns at this time.   Vaginal Discharge   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/02/2020   Generalized abdominal pain 08/04/2020   Tension headache 06/16/2020   Duct ectasia of breast, 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   Chest pain, unspecified 11/11/2019   GERD (gastroesophageal reflux disease) 08/08/2017   Nausea and vomiting 02/16/2017   Vaginal irritation 11/09/2016   Vitamin D deficiency 02/21/2015   Learning disability 01/27/2013    Past Surgical History:  Procedure Laterality Date   NO PAST SURGERIES      OB History     Gravida  4   Para  2   Term  2   Preterm      AB  2   Living   2      SAB  2   IAB      Ectopic      Multiple  0   Live Births  2        Obstetric Comments  Pregnancy #2 Baby Boy, Vit D Deficiency, Fetal Pyelectasis that resolved          Home Medications    Prior to Admission medications   Medication Sig Start Date End Date Taking? Authorizing Provider  conjugated estrogens (PREMARIN) vaginal cream Apply small amount to irritated area daily for 1 week. 02/28/22  Yes Dameron, Nolberto Hanlon, DO  fluconazole (DIFLUCAN) 150 MG tablet Take 1 tablet (150 mg total) by mouth daily for 1 dose. Take 1 tablet today and may repeat in 3 days if symptoms persist 10/11/22 10/12/22 Yes Radford Pax, NP  ibuprofen (ADVIL) 600 MG tablet Take 1 tablet (600 mg total) by mouth every 8 (eight) hours as needed for mild pain or moderate pain. 06/23/22  Yes Bing Neighbors, NP  Norgestimate-Ethinyl Estradiol Triphasic (TRI-LO-SPRINTEC) 0.18/0.215/0.25 MG-25 MCG tab Take 1 tablet by mouth daily. 06/26/22  Yes Maury Dus, MD    Family History Family History  Problem Relation Age of Onset   Diabetes Mother    Hypertension Mother    Diabetes Sister    Mental retardation Sister    Heart disease Maternal Aunt    Diabetes Maternal Aunt  Diabetes Maternal Uncle    Diabetes Maternal Grandmother    Heart disease Maternal Grandfather    Diabetes Maternal Grandfather    Colon cancer Neg Hx    Esophageal cancer Neg Hx    Pancreatic cancer Neg Hx    Stomach cancer Neg Hx     Social History Social History   Tobacco Use   Smoking status: Never   Smokeless tobacco: Never  Vaping Use   Vaping status: Never Used  Substance Use Topics   Alcohol use: Not Currently   Drug use: No     Allergies   Latex   Review of Systems Review of Systems  Genitourinary:  Positive for vaginal discharge.     Physical Exam Triage Vital Signs ED Triage Vitals [10/11/22 1109]  Encounter Vitals Group     BP 108/67     Systolic BP Percentile      Diastolic BP  Percentile      Pulse Rate 86     Resp 16     Temp 98.5 F (36.9 C)     Temp Source Oral     SpO2      Weight      Height      Head Circumference      Peak Flow      Pain Score 5     Pain Loc      Pain Education      Exclude from Growth Chart    No data found.  Updated Vital Signs BP 108/67 (BP Location: Right Arm)   Pulse 86   Temp 98.5 F (36.9 C) (Oral)   Resp 16   LMP 09/23/2022 (Exact Date)   Visual Acuity Right Eye Distance:   Left Eye Distance:   Bilateral Distance:    Right Eye Near:   Left Eye Near:    Bilateral Near:     Physical Exam Vitals and nursing note reviewed.  Constitutional:      Appearance: Normal appearance.  HENT:     Head: Normocephalic and atraumatic.  Eyes:     Pupils: Pupils are equal, round, and reactive to light.  Cardiovascular:     Rate and Rhythm: Normal rate.  Pulmonary:     Effort: Pulmonary effort is normal.  Abdominal:     Tenderness: There is no right CVA tenderness or left CVA tenderness.  Skin:    General: Skin is warm and dry.  Neurological:     General: No focal deficit present.     Mental Status: She is alert and oriented to person, place, and time.  Psychiatric:        Mood and Affect: Mood normal.        Behavior: Behavior normal.      UC Treatments / Results  Labs (all labs ordered are listed, but only abnormal results are displayed) Labs Reviewed  HIV ANTIBODY (ROUTINE TESTING W REFLEX)  POCT URINE PREGNANCY  POCT URINALYSIS DIP (MANUAL ENTRY)  CERVICOVAGINAL ANCILLARY ONLY    EKG   Radiology No results found.  Procedures Procedures (including critical care time)  Medications Ordered in UC Medications - No data to display  Initial Impression / Assessment and Plan / UC Course  I have reviewed the triage vital signs and the nursing notes.  Pertinent labs & imaging results that were available during my care of the patient were reviewed by me and considered in my medical decision making  (see chart for details).    Reviewed exam and symptoms  with patient.  No red flags.  STD testing is ordered and will contact for any positive results.  Will start Diflucan for yeast infection symptoms.  PCP follow-up as symptoms do not improve.  ER precautions reviewed. Final Clinical Impressions(s) / UC Diagnoses   Final diagnoses:  Acute vaginitis  Screening examination for STD (sexually transmitted disease)     Discharge Instructions      The clinic will contact you with results of the vaginal swab/blood work done today if positive.  Start Diflucan as needed for yeast infection symptoms.  Please follow-up with your PCP if symptoms do not improve.  Please go to the ER for any worsening symptoms.  I hope you feel better soon!     ED Prescriptions     Medication Sig Dispense Auth. Provider   fluconazole (DIFLUCAN) 150 MG tablet Take 1 tablet (150 mg total) by mouth daily for 1 dose. Take 1 tablet today and may repeat in 3 days if symptoms persist 2 tablet Radford Pax, NP      PDMP not reviewed this encounter.   Radford Pax, NP 10/11/22 1131

## 2022-10-11 NOTE — Discharge Instructions (Addendum)
The clinic will contact you with results of the vaginal swab/blood work done today if positive.  Start Diflucan as needed for yeast infection symptoms.  Please follow-up with your PCP if symptoms do not improve.  Please go to the ER for any worsening symptoms.  I hope you feel better soon!

## 2022-10-12 LAB — CERVICOVAGINAL ANCILLARY ONLY
Bacterial Vaginitis (gardnerella): NEGATIVE
Candida Glabrata: NEGATIVE
Candida Vaginitis: POSITIVE — AB
Chlamydia: NEGATIVE
Comment: NEGATIVE
Comment: NEGATIVE
Comment: NEGATIVE
Comment: NEGATIVE
Comment: NEGATIVE
Comment: NORMAL
Neisseria Gonorrhea: NEGATIVE
Trichomonas: NEGATIVE

## 2022-10-12 LAB — HIV ANTIBODY (ROUTINE TESTING W REFLEX): HIV Screen 4th Generation wRfx: NONREACTIVE

## 2022-10-16 ENCOUNTER — Ambulatory Visit (HOSPITAL_COMMUNITY)
Admission: RE | Admit: 2022-10-16 | Discharge: 2022-10-16 | Disposition: A | Discharge status: Home / Self Care | Payer: MEDICAID | Source: Ambulatory Visit | Attending: Family Medicine | Admitting: Family Medicine

## 2022-10-16 ENCOUNTER — Ambulatory Visit: Payer: MEDICAID | Admitting: Family Medicine

## 2022-10-16 ENCOUNTER — Other Ambulatory Visit: Payer: Self-pay

## 2022-10-16 ENCOUNTER — Telehealth: Payer: Self-pay

## 2022-10-16 ENCOUNTER — Encounter: Payer: Self-pay | Admitting: Family Medicine

## 2022-10-16 VITALS — BP 127/79 | HR 113 | Ht 68.0 in | Wt 240.6 lb

## 2022-10-16 DIAGNOSIS — J029 Acute pharyngitis, unspecified: Secondary | ICD-10-CM | POA: Insufficient documentation

## 2022-10-16 LAB — POCT RAPID STREP A (OFFICE): Rapid Strep A Screen: NEGATIVE

## 2022-10-16 MED ORDER — IOHEXOL 300 MG/ML  SOLN
80.0000 mL | Freq: Once | INTRAMUSCULAR | Status: AC | PRN
  Administered 2022-10-16: 80 mL via INTRAVENOUS

## 2022-10-16 MED ORDER — AMOXICILLIN-POT CLAVULANATE 875-125 MG PO TABS: 1.0000 | ORAL_TABLET | Freq: Two times a day (BID) | ORAL | 0 refills | Status: DC

## 2022-10-16 MED ORDER — FLUCONAZOLE 150 MG PO TABS: ORAL_TABLET | ORAL | 0 refills | Status: DC

## 2022-10-16 NOTE — Patient Instructions (Addendum)
It was great to see you again today.  As we discussed, I recommend you go to the ER for evaluation  Since you are wanting to avoid the ER we will try to manage this outpatient  Ordered CT scan of your neck  Go get augmentin prescription, sent in , start taking ASAP  If any worsening at all (trouble breathing, trouble swallowing, drooling, can't talk well, etc) please go to ER or call 911 immediately  Be well, Dr. Pollie Meyer  Peritonsillar Abscess  A peritonsillar abscess is a collection of pus in the back of the throat, behind the tonsils. It usually occurs when an infection of the throat or tonsils (tonsillitis) spreads into the tissues around the tonsils. What are the causes? The infection that leads to a peritonsillar abscess is usually caused by streptococcal bacteria. What increases the risk? You are more likely to develop this condition if: You have recently been diagnosed with an infection in your mouth or throat. You smoke. You have gum disease or gingivitis (periodontal disease). What are the signs or symptoms? Early symptoms of this condition include: Fever and chills. A sore throat, often with pain on just one side. Swollen, tender glands (lymph nodes) in the neck. Headache. As the infection gets worse, symptoms may include: Difficulty swallowing. Drooling because of difficulty swallowing saliva. Difficulty opening your mouth. Bad breath. Changes in how the voice sounds. How is this diagnosed? This condition may be diagnosed based on: Your symptoms and medical history. A physical exam. Imaging tests, such as ultrasound or CT scan. Testing a pus sample from the abscess. Your health care provider may collect a pus sample by swabbing the back of your throat or by removing some pus with a syringe and needle (needle aspiration). How is this treated? Treatment usually involves draining the pus from the abscess. This may be done through needle aspiration or by making an  incision in the abscess and draining the fluid. You will also likely need to take antibiotic medicine. Follow these instructions at home: Medicines Take over-the-counter and prescription medicines only as told by your health care provider. If you were prescribed an antibiotic, take it as told by your health care provider. Do not stop taking the antibiotic even if you start to feel better. Eating and drinking  Drink enough fluid to keep your urine pale yellow. While your throat is sore, try only drinking liquids or eating only soft-textured foods such as yogurt and ice cream. Activity Rest as told by your health care provider. Return to your normal activities as told by your health care provider. Ask your health care provider what activities are safe for you. General Instructions If your abscess was drained, gargle with a mixture of salt and water 3-4 times a day or as needed. To make salt water, completely dissolve -1 tsp (3-6 g) of salt in 1 cup (237 mL) of warm water. Do not swallow this mixture. Do not use any products that contain nicotine or tobacco. These products include cigarettes, chewing tobacco, and vaping devices, such as e-cigarettes. If you need help quitting, ask your health care provider. Keep all follow-up visits. This is important. Contact a health care provider if: You have more pain, swelling, redness, or pus in your throat. You have a headache. You have a lack of energy (lethargy) or feel generally sick. You have a fever or chills. You have trouble swallowing or eating. You have signs of dehydration, such as: Light-headedness or dizziness when standing. Urinating less than  usual. A fast heart rate. Dry mouth. Get help right away if: You are unable to swallow. You have trouble breathing, or it is easier for you to breathe when you lean forward. You cough up blood or vomit blood after treatment. You have severe throat pain that does not get better with  medicine. These symptoms may represent a serious problem that is an emergency. Do not wait to see if the symptoms will go away. Get medical help right away. Call your local emergency services (911 in the [place] Do not drive yourself to the hospital. Summary A peritonsillar abscess is a collection of pus in the back of the throat. It usually occurs when an infection of the throat or tonsils spreads to surrounding tissues. Symptoms include a sore throat, difficulty swallowing, fever, chills, and occasional drooling. This condition is treated by draining the abscess and taking antibiotic medicine. Call your health care provider if you have trouble swallowing or eating after treatment. Get help right away if you vomit blood or cough up blood after treatment. This information is not intended to replace advice given to you by your health care provider. Make sure you discuss any questions you have with your health care provider. Document Revised: 04/30/2020 Document Reviewed: 04/30/2020 Elsevier Patient Education  2024 [employer].

## 2022-10-16 NOTE — Telephone Encounter (Signed)
Received call report from [facility] regarding CT results.   See below.   IMPRESSION: 1. Enlarged palatine tonsils bilaterally, left greater than right compatible with acute tonsillitis. 2. No discrete mass or fluid collection to suggest abscess. 3. Enlarged level 2 lymph nodes bilaterally, left greater than right, likely reactive. No necrotic or suppurative nodes are present. 4. Adenoid hypertrophy without a focal lesion likely related to pharyngitis. 5. Polyps or mucous retention cysts within the inferior maxillary sinuses bilaterally.  Forwarding to ordering provider.   Veronda Prude, RN

## 2022-10-16 NOTE — Telephone Encounter (Signed)
Spoke with patient, reviewed results, continue antibiotics, monitor for improvement, reviewed return precautions. Kristen Dodrill, MD

## 2022-10-16 NOTE — Progress Notes (Unsigned)
  Date of Visit: 10/16/2022   SUBJECTIVE:   HPI:  Kristen Dean presents today for a same day appointment to discuss possible strep throat.  Reports sore throat beginning 2 weeks ago.  No fevers but has felt warm.  Has tried Tylenol without any relief.  When it all started she had some nasal congestion and her kids were sick with a cold.  Since then though, has developed worsened sore throat on the left side.  Does think her voice sounds a little bit different today.  It hurts a lot to swallow and she has noted more saliva in her mouth.  No drooling, and is able to swallow just with pain.  PMHx: History of GERD  OBJECTIVE:   BP 127/79   Pulse (!) 113   Ht 5\' 8"  (1.727 m)   Wt 240 lb 9.6 oz (109.1 kg)   LMP 09/23/2022 (Exact Date)   SpO2 100%   BMI 36.58 kg/m  Gen: No acute distress, pleasant, cooperative, well-appearing.  Voice with slight hot potato quality. HEENT: Normocephalic, atraumatic, nares patent, TMs clear bilaterally, oropharynx with marked swelling, erythema and exudate of the left tonsil.  Tender anterior cervical lymphadenopathy on the left side.  Uvula midline.  No trismus. Heart: Regular rate and rhythm, no murmur Lungs: Clear to auscultation bilaterally, normal effort Neuro: Grossly nonfocal, speech normal other than slight hot potato quality Ext: No edema  ASSESSMENT/PLAN:   Assessment & Plan Sore throat Rapid strep negative, though given hot potato voice, asymmetric swelling with exudate, tender anterior cervical lymphadenopathy, pain with swallowing, and increased saliva in the mouth, I do have some concern for peritonsillar abscess.   - Patient strongly wanted to avoid going to the emergency room, so stat CT scan of the neck was ordered and scheduled for today.  Sherron Monday with on-call ENT physician Dr. Jearld Fenton who agreed with these recommendations, only edition he would recommend was dexamethasone.   - Will treat with Augmentin while awaiting CT scan results, if  proven to have peritonsillar abscess will add dexamethasone at that time.   - Sent strep culture.  - Discussed return precautions and reasons to go to the ER, including trismus, inability to tolerate secretions, breathing difficulty, inability to swallow.   -Check CBC differential and BMET today - Prescription sent in for Diflucan as patient reports frequently getting vaginal candidiasis after antibiotic treatment.    [place] J. Pollie Meyer, MD [facility]

## 2022-10-19 LAB — CBC WITH DIFFERENTIAL/PLATELET
Basophils Absolute: 0.1 10*3/uL (ref 0.0–0.2)
Basos: 1 %
EOS (ABSOLUTE): 0 10*3/uL (ref 0.0–0.4)
Eos: 0 %
Hematocrit: 40.3 % (ref 34.0–46.6)
Hemoglobin: 13.6 g/dL (ref 11.1–15.9)
Immature Grans (Abs): 0 10*3/uL (ref 0.0–0.1)
Immature Granulocytes: 0 %
Lymphocytes Absolute: 2.2 10*3/uL (ref 0.7–3.1)
Lymphs: 20 %
MCH: 30.2 pg (ref 26.6–33.0)
MCHC: 33.7 g/dL (ref 31.5–35.7)
MCV: 89 fL (ref 79–97)
Monocytes Absolute: 1 10*3/uL — ABNORMAL HIGH (ref 0.1–0.9)
Monocytes: 9 %
Neutrophils Absolute: 7.7 10*3/uL — ABNORMAL HIGH (ref 1.4–7.0)
Neutrophils: 70 %
Platelets: 333 10*3/uL (ref 150–450)
RBC: 4.51 x10E6/uL (ref 3.77–5.28)
RDW: 13.3 % (ref 11.7–15.4)
WBC: 11 10*3/uL — ABNORMAL HIGH (ref 3.4–10.8)

## 2022-10-19 LAB — BASIC METABOLIC PANEL
BUN/Creatinine Ratio: 9 (ref 9–23)
BUN: 7 mg/dL (ref 6–20)
CO2: 20 mmol/L (ref 20–29)
Calcium: 9.3 mg/dL (ref 8.7–10.2)
Chloride: 99 mmol/L (ref 96–106)
Creatinine, Ser: 0.82 mg/dL (ref 0.57–1.00)
Glucose: 96 mg/dL (ref 70–99)
Potassium: 4.4 mmol/L (ref 3.5–5.2)
Sodium: 137 mmol/L (ref 134–144)
eGFR: 102 mL/min/{1.73_m2} (ref >59)

## 2022-10-19 LAB — CULTURE, GROUP A STREP

## 2022-11-08 ENCOUNTER — Encounter: Payer: Self-pay | Admitting: Student

## 2022-11-08 ENCOUNTER — Telehealth: Payer: Self-pay | Admitting: Student

## 2022-11-08 ENCOUNTER — Ambulatory Visit (INDEPENDENT_AMBULATORY_CARE_PROVIDER_SITE_OTHER): Payer: MEDICAID | Admitting: Student

## 2022-11-08 VITALS — BP 118/69 | HR 71 | Ht 68.0 in | Wt 239.2 lb

## 2022-11-08 DIAGNOSIS — B9689 Other specified bacterial agents as the cause of diseases classified elsewhere: Secondary | ICD-10-CM | POA: Diagnosis not present

## 2022-11-08 DIAGNOSIS — N760 Acute vaginitis: Secondary | ICD-10-CM | POA: Diagnosis not present

## 2022-11-08 LAB — POCT WET PREP (WET MOUNT)
Clue Cells Wet Prep Whiff POC: POSITIVE
Trichomonas Wet Prep HPF POC: ABSENT

## 2022-11-08 MED ORDER — METRONIDAZOLE 500 MG PO TABS
500.0000 mg | ORAL_TABLET | Freq: Two times a day (BID) | ORAL | 0 refills | Status: AC
Start: 2022-11-08 — End: 2022-11-15

## 2022-11-08 NOTE — Telephone Encounter (Signed)
Wet prep was whiff positive consistent with BV. Called patient, informed her of result and treatment with  metronidazole 500 mg twice daily for 7 days. Patient verbalized understanding.

## 2022-11-08 NOTE — Progress Notes (Signed)
    SUBJECTIVE:   CHIEF COMPLAINT / HPI:   22 y.o.  year old female presents with complaint of vaginal irritation Symptoms started 2-3 days ago She denies any increased frequency and urgency.  She is sexually active with one partner and inconsistent with barrier protection. Endorses mild odor but denies any vaginal discharge, itchiness  No hematuria or recent change in medication. Currently on OCP, Sprintec's    PERTINENT  PMH / PSH: Reviewed  OBJECTIVE:   BP 118/69   Pulse 71   Ht 5\' 8"  (1.727 m)   Wt 239 lb 3.2 oz (108.5 kg)   SpO2 96%   BMI 36.37 kg/m    Physical Exam General: Alert, well appearing, NAD Cardiovascular: RRR, No Murmurs, Normal S2/S2 Respiratory: CTAB, No wheezing or Rales Abdomen: No distension or tenderness Genitalia:  No external lesions, white copious vaginal discharge, No odor, mucosa pink and moist, no vaginal or cervical lesions, no vaginal atrophy, no friaility or hemorrhage, normal uterus size and position   CMA Reinaldo Berber served as chaperone for the exam.  ASSESSMENT/PLAN:   Vaginal irritation Patient with complain of vaginal irritation and odor. Her vaginal exam showed white.  discharge suspicious of candida.  Will obtain wet prep to assess for infection.  Per patient she recently had an STD testing completed which was negative. -Ordered lab for wet prep  Health maintenance Patient is due for Pap smear in few days.  She elected to have this completed at another time.  Encouraged patient to schedule follow-up with her PCP to have her Pap smear completed.     Jerre Simon, MD [facility]

## 2022-11-08 NOTE — Patient Instructions (Signed)
It was wonderful to see you today. Thank you for allowing me to be a part of your care. Below is a short summary of what we discussed at your visit today:  Today we did a vaginal pelvic exam and collected sample to test you for BV/Candida infection.  Will follow-up with you with results.  You are due for your Pap smear please schedule an appointment with your PCP to have your Pap smear completed.   If you have any questions or concerns, please do not hesitate to contact us via phone or MyChart message.   Jerre Simon, MD [facility]

## 2022-11-13 ENCOUNTER — Other Ambulatory Visit (HOSPITAL_COMMUNITY)
Admission: RE | Admit: 2022-11-13 | Discharge: 2022-11-13 | Disposition: A | Discharge status: Home / Self Care | Payer: MEDICAID | Source: Ambulatory Visit | Attending: Family Medicine | Admitting: Family Medicine

## 2022-11-13 ENCOUNTER — Ambulatory Visit (INDEPENDENT_AMBULATORY_CARE_PROVIDER_SITE_OTHER): Payer: MEDICAID | Admitting: Student

## 2022-11-13 ENCOUNTER — Encounter: Payer: Self-pay | Admitting: Student

## 2022-11-13 VITALS — BP 115/77 | HR 92 | Ht 68.0 in

## 2022-11-13 DIAGNOSIS — Z124 Encounter for screening for malignant neoplasm of cervix: Secondary | ICD-10-CM | POA: Diagnosis not present

## 2022-11-13 NOTE — Progress Notes (Signed)
    SUBJECTIVE:   CHIEF COMPLAINT / HPI:   22 year old female presenting today to complete her Pap smear. Last pap was in 2022 and normal. Patient has no other concerns today  PERTINENT  PMH / PSH: Reviewed   OBJECTIVE:   BP 115/77   Pulse 92   Ht 5\' 8"  (1.727 m)   SpO2 98%   BMI 36.37 kg/m    Physical Exam General: Alert, well appearing, NAD, Oriented x4 Cardiovascular: RRR, No Murmurs, Normal S2/S2 Respiratory: CTAB, No wheezing or Rales Abdomen: No distension or tenderness Genitalia:  Normal introitus for age, no external lesions, No discharge, mucosa pink and moist, no vaginal or cervical lesions, no vaginal atrophy, no friaility or hemorrhage, normal uterus size and position   CMA Bobbie Stack served as chaperone for the exam  ASSESSMENT/PLAN:   Cervical cancer screening Patient due for her Pap smear.  Samples obtained for Pap cytology.  Jerre Simon, MD [facility]

## 2022-11-13 NOTE — Patient Instructions (Signed)
Good to see you today.  Today we took cervical sample for your Pap smear.  Might have some mild bleeding but that should only last for about a day or 2.  If you have any questions or concerns, please do not hesitate to contact us via phone or MyChart message.   Jerre Simon, MD [facility]

## 2022-11-15 LAB — CYTOLOGY - PAP: Diagnosis: NEGATIVE

## 2022-12-04 ENCOUNTER — Other Ambulatory Visit (HOSPITAL_COMMUNITY)
Admission: RE | Admit: 2022-12-04 | Discharge: 2022-12-04 | Disposition: A | Discharge status: Home / Self Care | Payer: MEDICAID | Source: Ambulatory Visit | Attending: Family Medicine | Admitting: Family Medicine

## 2022-12-04 ENCOUNTER — Encounter: Payer: Self-pay | Admitting: Student

## 2022-12-04 ENCOUNTER — Ambulatory Visit (INDEPENDENT_AMBULATORY_CARE_PROVIDER_SITE_OTHER): Payer: MEDICAID | Admitting: Student

## 2022-12-04 VITALS — BP 118/66 | HR 79 | Ht 68.0 in | Wt 237.0 lb

## 2022-12-04 DIAGNOSIS — N898 Other specified noninflammatory disorders of vagina: Secondary | ICD-10-CM | POA: Diagnosis present

## 2022-12-04 DIAGNOSIS — R8290 Unspecified abnormal findings in urine: Secondary | ICD-10-CM

## 2022-12-04 LAB — POCT WET PREP (WET MOUNT)
Clue Cells Wet Prep Whiff POC: NEGATIVE
Trichomonas Wet Prep HPF POC: ABSENT

## 2022-12-04 LAB — POCT URINALYSIS DIP (CLINITEK)
Bilirubin, UA: NEGATIVE
Blood, UA: NEGATIVE
Glucose, UA: NEGATIVE mg/dL
Ketones, POC UA: NEGATIVE mg/dL
Leukocytes, UA: NEGATIVE
Nitrite, UA: NEGATIVE
POC PROTEIN,UA: NEGATIVE
Spec Grav, UA: 1.025 (ref 1.010–1.025)
Urobilinogen, UA: 0.2 U/dL
pH, UA: 6 (ref 5.0–8.0)

## 2022-12-04 NOTE — Progress Notes (Signed)
    SUBJECTIVE:   CHIEF COMPLAINT / HPI:   The patient presents with vaginal discharge that started after her last menstrual period. She describes the discharge as unusual and associated with an odor. She denies dysuria, urinary frequency, or urinary urgency. She has been using a new antibacterial bar soap during her period and wonders if this could be contributing to the symptoms. She requests STD testing. Has a concern about irritation around the clitoris.   PERTINENT  PMH / PSH: None relevant  OBJECTIVE:   BP 118/66   Pulse 79   Ht 5\' 8"  (1.727 m)   Wt 237 lb (107.5 kg)   LMP 11/23/2022   SpO2 100%   BMI 36.04 kg/m    General: NAD, pleasant, able to participate in exam Respiratory: Normal effort, no obvious respiratory distress Pelvic: VULVA: normal appearing vulva with no masses, tenderness or lesions, VAGINA: Normal appearing vagina with normal color, no lesions, with white discharge present, CERVIX: No lesions, white discharge present  Chaperone Glori Bickers, RN present for pelvic exam  ASSESSMENT/PLAN:   Assessment & Plan Vaginal discharge Vaginal discharge and clitoral irritation.  Physical exam significant for whitish discharge.  Wet prep performed today shows no organisms. Patient is interested in vaginal STI screening.  UA obtained due to malodorous urine however negative UTI symptoms and UA clear. Plan: -Wet prep as above. -F/u G/C -Discussed protection during intercourse and contraceptive methods -Follow-up as needed    Levin Erp, MD [facility]

## 2022-12-04 NOTE — Patient Instructions (Addendum)
It was great to see you! Thank you for allowing me to participate in your care!   Our plans for today:  - Your urine test was negative - We will get swabs today to check and I will let you know what this shows  Take care and seek immediate care sooner if you develop any concerns.  Levin Erp, MD

## 2022-12-06 LAB — CERVICOVAGINAL ANCILLARY ONLY
Chlamydia: NEGATIVE
Comment: NEGATIVE
Comment: NORMAL
Neisseria Gonorrhea: NEGATIVE

## 2022-12-18 ENCOUNTER — Other Ambulatory Visit: Payer: Self-pay | Admitting: Student

## 2022-12-18 ENCOUNTER — Encounter: Payer: Self-pay | Admitting: Student

## 2022-12-18 MED ORDER — NORGESTIMATE-ETH ESTRADIOL 0.18/0.215/0.25 MG-25 MCG PO TABS: 1.0000 | ORAL_TABLET | Freq: Every day | ORAL | 3 refills | Status: DC

## 2023-02-07 ENCOUNTER — Ambulatory Visit
Admission: EM | Admit: 2023-02-07 | Discharge: 2023-02-07 | Disposition: A | Discharge status: Home / Self Care | Payer: MEDICAID | Attending: Family Medicine | Admitting: Family Medicine

## 2023-02-07 DIAGNOSIS — B9689 Other specified bacterial agents as the cause of diseases classified elsewhere: Secondary | ICD-10-CM | POA: Diagnosis present

## 2023-02-07 DIAGNOSIS — N760 Acute vaginitis: Secondary | ICD-10-CM | POA: Diagnosis not present

## 2023-02-07 LAB — POCT URINE PREGNANCY: Preg Test, Ur: NEGATIVE

## 2023-02-07 MED ORDER — FLUCONAZOLE 150 MG PO TABS: 150.0000 mg | ORAL_TABLET | ORAL | 0 refills | Status: DC

## 2023-02-07 MED ORDER — METRONIDAZOLE 500 MG PO TABS: 500.0000 mg | ORAL_TABLET | Freq: Two times a day (BID) | ORAL | 0 refills | Status: DC

## 2023-02-07 NOTE — ED Triage Notes (Signed)
 Pt requested STD's test. Pt reports spotting x 1 day.

## 2023-02-07 NOTE — Discharge Instructions (Signed)
 Go ahead and start metronidazole  to treat a suspected recurring BV infection.  Because this antibiotic can give you yeast infection you are prone to them, recommend taking fluconazole  as well.  Will update you with your test results tomorrow.

## 2023-02-07 NOTE — ED Provider Notes (Signed)
 [facility]  Note:  This document was prepared using Conservation officer, historic buildings and may include unintentional dictation errors.  MRN: [id number] DOB: Oct 21, 2000  Subjective:   Kristen Dean is a 23 y.o. female presenting for 1 day history of recurrent vaginal discharge, vaginal spotting, slight vaginal irritation. Would like STI testing.  Has history of BV infection and yeast infections.  No urinary symptoms.  No current facility-administered medications for this encounter.  Current Outpatient Medications:    conjugated estrogens  (PREMARIN ) vaginal cream, Apply small amount to irritated area daily for 1 week., Disp: 30 g, Rfl: 0   fluconazole  (DIFLUCAN ) 150 MG tablet, If develop yeast infection from antibiotic, take one pill once. Repeat 3 days later if symptoms not improved., Disp: 2 tablet, Rfl: 0   ibuprofen  (ADVIL ) 600 MG tablet, Take 1 tablet (600 mg total) by mouth every 8 (eight) hours as needed for mild pain or moderate pain., Disp: 30 tablet, Rfl: 0   Norgestimate -Eth Estradiol  (TRI-LO-MILI) 0.18/0.215/0.25 MG-25 MCG TABS, Take 1 tablet by mouth daily., Disp: 84 tablet, Rfl: 3   Allergies  Allergen Reactions   Latex Itching    Past Medical History:  Diagnosis Date   Chest pain, unspecified 11/11/2019   GERD (gastroesophageal reflux disease) 08/08/2017   Medical history non-contributory    Nausea and vomiting 02/16/2017   Screening for STDs (sexually transmitted diseases) 04/26/2021   Tension headache 06/16/2020     Past Surgical History:  Procedure Laterality Date   NO PAST SURGERIES      Family History  Problem Relation Age of Onset   Diabetes Mother    Hypertension Mother    Diabetes Sister    Mental retardation Sister    Heart disease Maternal Aunt    Diabetes Maternal Aunt    Diabetes Maternal Uncle    Diabetes Maternal Grandmother    Heart disease Maternal Grandfather    Diabetes Maternal Grandfather    Colon cancer  Neg Hx    Esophageal cancer Neg Hx    Pancreatic cancer Neg Hx    Stomach cancer Neg Hx     Social History   Tobacco Use   Smoking status: Never   Smokeless tobacco: Never  Vaping Use   Vaping status: Never Used  Substance Use Topics   Alcohol use: Not Currently   Drug use: No    ROS   Objective:   Vitals: BP (!) 116/57 (BP Location: Left Arm)   Pulse 67   Temp 99.4 F (37.4 C) (Oral)   Resp 16   LMP 01/18/2023 (Exact Date)   SpO2 99%   Physical Exam Constitutional:      General: She is not in acute distress.    Appearance: Normal appearance. She is well-developed. She is not ill-appearing, toxic-appearing or diaphoretic.  HENT:     Head: Normocephalic and atraumatic.     Nose: Nose normal.     Mouth/Throat:     Mouth: Mucous membranes are moist.  Eyes:     General: No scleral icterus.       Right eye: No discharge.        Left eye: No discharge.     Extraocular Movements: Extraocular movements intact.     Conjunctiva/sclera: Conjunctivae normal.  Cardiovascular:     Rate and Rhythm: Normal rate.  Pulmonary:     Effort: Pulmonary effort is normal.  Abdominal:     General: Bowel sounds are normal. There is no distension.  Palpations: Abdomen is soft. There is no mass.     Tenderness: There is no abdominal tenderness. There is no right CVA tenderness, left CVA tenderness, guarding or rebound.  Skin:    General: Skin is warm and dry.  Neurological:     General: No focal deficit present.     Mental Status: She is alert and oriented to person, place, and time.  Psychiatric:        Mood and Affect: Mood normal.        Behavior: Behavior normal.        Thought Content: Thought content normal.        Judgment: Judgment normal.    Results for orders placed or performed during the hospital encounter of 02/07/23 (from the past 24 hours)  POCT urine pregnancy     Status: None   Collection Time: 02/07/23  4:41 PM  Result Value Ref Range   Preg Test, Ur  Negative Negative    Assessment and Plan :   PDMP not reviewed this encounter.  1. Bacterial vaginosis   2. Acute vaginitis    We will treat patient empirically for bacterial vaginosis with Flagyl  and for yeast vaginitis with fluconazole .  Labs pending. Counseled patient on potential for adverse effects with medications prescribed/recommended today, ER and return-to-clinic precautions discussed, patient verbalized understanding.    Christopher Savannah, [place] 02/07/23 1649

## 2023-02-08 LAB — CERVICOVAGINAL ANCILLARY ONLY
Bacterial Vaginitis (gardnerella): POSITIVE — AB
Candida Glabrata: NEGATIVE
Candida Vaginitis: NEGATIVE
Chlamydia: NEGATIVE
Comment: NEGATIVE
Comment: NEGATIVE
Comment: NEGATIVE
Comment: NEGATIVE
Comment: NEGATIVE
Comment: NORMAL
Neisseria Gonorrhea: NEGATIVE
Trichomonas: NEGATIVE

## 2023-03-27 ENCOUNTER — Ambulatory Visit
Admission: EM | Admit: 2023-03-27 | Discharge: 2023-03-27 | Disposition: A | Discharge status: Home / Self Care | Payer: MEDICAID | Attending: Family Medicine | Admitting: Family Medicine

## 2023-03-27 DIAGNOSIS — Z113 Encounter for screening for infections with a predominantly sexual mode of transmission: Secondary | ICD-10-CM

## 2023-03-27 DIAGNOSIS — M546 Pain in thoracic spine: Secondary | ICD-10-CM | POA: Insufficient documentation

## 2023-03-27 DIAGNOSIS — M549 Dorsalgia, unspecified: Secondary | ICD-10-CM

## 2023-03-27 DIAGNOSIS — M545 Low back pain, unspecified: Secondary | ICD-10-CM

## 2023-03-27 DIAGNOSIS — R519 Headache, unspecified: Secondary | ICD-10-CM | POA: Diagnosis not present

## 2023-03-27 LAB — POCT URINE PREGNANCY: Preg Test, Ur: NEGATIVE

## 2023-03-27 MED ORDER — CYCLOBENZAPRINE HCL 5 MG PO TABS: 5.0000 mg | ORAL_TABLET | Freq: Every evening | ORAL | 0 refills | Status: DC | PRN

## 2023-03-27 MED ORDER — NAPROXEN 500 MG PO TABS: 500.0000 mg | ORAL_TABLET | Freq: Two times a day (BID) | ORAL | 0 refills | Status: DC
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
## 2023-03-28 LAB — CERVICOVAGINAL ANCILLARY ONLY
Chlamydia: NEGATIVE
Comment: NEGATIVE
Comment: NEGATIVE
Comment: NORMAL
Neisseria Gonorrhea: NEGATIVE
Trichomonas: NEGATIVE

## 2023-04-02 ENCOUNTER — Ambulatory Visit
Admission: EM | Admit: 2023-04-02 | Discharge: 2023-04-02 | Disposition: A | Discharge status: Home / Self Care | Payer: MEDICAID | Attending: Family Medicine | Admitting: Family Medicine

## 2023-04-02 ENCOUNTER — Other Ambulatory Visit: Payer: Self-pay

## 2023-04-02 ENCOUNTER — Ambulatory Visit: Payer: MEDICAID | Admitting: Student

## 2023-04-02 DIAGNOSIS — N898 Other specified noninflammatory disorders of vagina: Secondary | ICD-10-CM | POA: Insufficient documentation

## 2023-04-02 DIAGNOSIS — B3749 Other urogenital candidiasis: Secondary | ICD-10-CM | POA: Diagnosis not present

## 2023-04-02 DIAGNOSIS — Z0000 Encounter for general adult medical examination without abnormal findings: Secondary | ICD-10-CM

## 2023-04-02 MED ORDER — FLUCONAZOLE 150 MG PO TABS: 150.0000 mg | ORAL_TABLET | Freq: Every day | ORAL | 0 refills | Status: DC

## 2023-04-02 MED ORDER — METRONIDAZOLE 500 MG PO TABS: 500.0000 mg | ORAL_TABLET | Freq: Two times a day (BID) | ORAL | 0 refills | Status: DC

## 2023-04-02 NOTE — ED Triage Notes (Signed)
 Pt is here just wanting to be seen vaginal odor that started before 03/27/2023 but states her test results didn't have BV include so wants testing today.
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
## 2023-04-02 NOTE — Discharge Instructions (Addendum)
 Clinical contact you with results of the vaginal swab done today if positive.  Start metronidazole twice daily for 7 days.  Take Diflucan as prescribed to help prevent antibiotic induced yeast infections.  Follow-up with your PCP or gynecologist if your symptoms do not improve.  Please go to the ER for any worsening symptoms.  Hope you feel better soon!

## 2023-04-02 NOTE — Patient Instructions (Signed)
 It was great to see you! Thank you for allowing me to participate in your care!  I recommend that you always bring your medications to each appointment as this makes it easy to ensure we are on the correct medications and helps [place] not miss when refills are needed.  Our plans for today:  - Back Pain You likely have a musculoskeletal irritation causing your symptoms. We are going to obtain some imaging to make sure there are no fractures or dislocations.   Xray of your:   Neck, mid back, lower back  Location:   [facility] Address: [address] [place], [place], [place] [address] Phone: [phone]   Lidocaine patches for pain  If imaging is negative and symptoms continue throughout the week, follow up with [facility] clinic for evaluation. Call and schedule appointment at your convenience.    [facility]  Address: [address] [place], [place], [place] [address] Phone: [phone]  Take care and seek immediate care sooner if you develop any concerns.   Dr. Bess Kinds, MD [facility]
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
## 2023-04-03 ENCOUNTER — Ambulatory Visit
Admission: RE | Admit: 2023-04-03 | Discharge: 2023-04-03 | Disposition: A | Discharge status: Home / Self Care | Payer: MEDICAID | Source: Ambulatory Visit | Attending: Family Medicine | Admitting: Family Medicine

## 2023-04-03 LAB — CERVICOVAGINAL ANCILLARY ONLY
Bacterial Vaginitis (gardnerella): POSITIVE — AB
Candida Glabrata: NEGATIVE
Candida Vaginitis: POSITIVE — AB
Comment: NEGATIVE
Comment: NEGATIVE
Comment: NEGATIVE

## 2023-04-12 ENCOUNTER — Ambulatory Visit
Admission: EM | Admit: 2023-04-12 | Discharge: 2023-04-12 | Disposition: A | Discharge status: Home / Self Care | Payer: MEDICAID | Attending: Family Medicine | Admitting: Family Medicine

## 2023-04-12 DIAGNOSIS — N898 Other specified noninflammatory disorders of vagina: Secondary | ICD-10-CM | POA: Insufficient documentation

## 2023-04-12 DIAGNOSIS — R350 Frequency of micturition: Secondary | ICD-10-CM | POA: Diagnosis present

## 2023-04-12 DIAGNOSIS — Z113 Encounter for screening for infections with a predominantly sexual mode of transmission: Secondary | ICD-10-CM | POA: Insufficient documentation

## 2023-04-12 LAB — POCT URINALYSIS DIP (MANUAL ENTRY)
Bilirubin, UA: NEGATIVE
Glucose, UA: NEGATIVE mg/dL
Ketones, POC UA: NEGATIVE mg/dL
Leukocytes, UA: NEGATIVE
Nitrite, UA: NEGATIVE
Protein Ur, POC: NEGATIVE mg/dL
Spec Grav, UA: 1.015 (ref 1.010–1.025)
Urobilinogen, UA: 0.2 U/dL
pH, UA: 6 (ref 5.0–8.0)

## 2023-04-12 LAB — POCT URINE PREGNANCY: Preg Test, Ur: NEGATIVE

## 2023-04-12 NOTE — Discharge Instructions (Addendum)
 The clinic will contact you with results of the vaginal swab/STD testing done today if positive.  Lots of rest and fluids.  Follow-up with your PCP as needed.  Please go to the ER for any worsening symptoms.

## 2023-04-12 NOTE — ED Triage Notes (Signed)
 Pt states she wants to be tested for Std's.  States she recently had BV and is still having discharge.  States she wants to be swabbed and have blood work.
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
## 2023-04-13 LAB — RPR: RPR Ser Ql: NONREACTIVE

## 2023-04-13 LAB — URINE CULTURE

## 2023-04-13 LAB — HIV ANTIBODY (ROUTINE TESTING W REFLEX): HIV Screen 4th Generation wRfx: NONREACTIVE

## 2023-04-17 LAB — CERVICOVAGINAL ANCILLARY ONLY
Bacterial Vaginitis (gardnerella): NEGATIVE
Candida Glabrata: NEGATIVE
Candida Vaginitis: NEGATIVE
Chlamydia: NEGATIVE
Comment: NEGATIVE
Comment: NEGATIVE
Comment: NEGATIVE
Comment: NEGATIVE
Comment: NEGATIVE
Comment: NORMAL
Neisseria Gonorrhea: NEGATIVE
Trichomonas: NEGATIVE

## 2023-05-08 ENCOUNTER — Encounter: Payer: Self-pay | Admitting: Family Medicine

## 2023-05-08 ENCOUNTER — Ambulatory Visit: Payer: MEDICAID | Admitting: Family Medicine

## 2023-05-08 VITALS — BP 120/76 | HR 81 | Ht 68.0 in | Wt 233.8 lb

## 2023-05-08 DIAGNOSIS — N926 Irregular menstruation, unspecified: Secondary | ICD-10-CM

## 2023-05-08 LAB — POCT URINE PREGNANCY: Preg Test, Ur: POSITIVE — AB

## 2023-05-08 MED ORDER — PRENATAL 19 PO CHEW
1.0000 | CHEWABLE_TABLET | Freq: Every day | ORAL | 3 refills | Status: DC
Start: 2023-05-08 — End: 2023-08-01

## 2023-05-08 NOTE — Patient Instructions (Addendum)
 It was wonderful to see you today! Thank you for choosing [facility].   Please bring ALL of your medications with you to every visit.   Today we talked about:  I am going to get a blood pregnancy test and hormone level today that we can trend how the pregnancy is progressing.  We will need to get a repeat beta hCG level in 48 hours to make sure it is doubling.  Once the level is over 2000 we can get an ultrasound to monitor the pregnancy. I am going to hold on getting the initial OB lab work until the beta-hCG is doubling and then we can pursue it at that time.  Please follow up in 2 days   We are checking some labs today. If they are abnormal, I will call you. If they are normal, I will send you a MyChart message (if it is active) or a letter in the mail. If you do not hear about your labs in the next 2 weeks, please call the office.  Call the clinic at [phone] if your symptoms worsen or you have any concerns.  Please be sure to schedule follow up at the front desk before you leave today.   Jonne Netters, DO Family Medicine

## 2023-05-08 NOTE — Progress Notes (Signed)
    SUBJECTIVE:   CHIEF COMPLAINT / HPI:   Positive home pregnancy test LMP 04/07/2023, [redacted]w[redacted]d gestation. Some brown bleeding yesterday that can interchange with red. More pink tinge. No other vaginal discharge.  Minimal cramping.  Unplanned pregnancy but desired.  G4, P2, history of miscarriage x 2.  She had 1 miscarriage prior to her first child and then had 1 miscarriage prior to her second child.  Unknown cause of miscarriages.  No family history of recurrent miscarriages.  Last pregnancy in 2022.  Not currently taking prenatal vitamin.  PERTINENT  PMH / PSH: None  OBJECTIVE:   BP 120/76   Pulse 81   Ht 5\' 8"  (1.727 m)   Wt 233 lb 12.8 oz (106.1 kg)   LMP 04/07/2023 (Exact Date)   SpO2 98%   BMI 35.55 kg/m   General: NAD, pleasant, able to participate in exam Cardiac: RRR, no murmurs. Respiratory: CTAB, normal effort, No wheezes, rales or rhonchi Abdomen: Bowel sounds present, nontender, nondistended Extremities: no edema or cyanosis. Skin: warm and dry, no rashes noted Neuro: alert, no obvious focal deficits Psych: Normal affect and mood  ASSESSMENT/PLAN:   Assessment & Plan Missed period LMP 04/07/2023, [redacted]w[redacted]d gestation.  Very faint positive line in office and given early gestation no utility in bedside US  at this time.  Light spotting and mild cramping given history of recurrent miscarriage pregnancy may be nonviable.  Patient desires to continue with pregnancy if possible.  Spoke with OB/GYN on-call Dr. Ozan, she recommended serial beta hCG with one today and a repeat in 48 hours.  She also recommended obtaining progesterone level and if <5, would treat with Prometrium 200mg  vag or Prometrium 100mg  oral without need to repeat level.  Once beta hCG level >2000, then obtain US  and initial OB lab work. -Beta-hCG, repeat level in 48 hours -Progesterone level -Start prenatal vitamin -Avoid toxic exposures such as alcohol, smoke and NSAIDs   Dr. Jonne Netters, DO [place]  [facility]

## 2023-05-09 ENCOUNTER — Encounter: Payer: Self-pay | Admitting: Family Medicine

## 2023-05-09 ENCOUNTER — Other Ambulatory Visit: Payer: Self-pay | Admitting: Family Medicine

## 2023-05-09 ENCOUNTER — Telehealth: Payer: Self-pay | Admitting: Family Medicine

## 2023-05-09 DIAGNOSIS — N96 Recurrent pregnancy loss: Secondary | ICD-10-CM

## 2023-05-09 DIAGNOSIS — N926 Irregular menstruation, unspecified: Secondary | ICD-10-CM

## 2023-05-09 LAB — PROGESTERONE: Progesterone: 3.1 ng/mL

## 2023-05-09 LAB — BETA HCG QUANT (REF LAB): hCG Quant: 19 m[IU]/mL

## 2023-05-09 MED ORDER — PROGESTERONE 200 MG VA SUPP: 200.0000 mg | Freq: Every day | VAGINAL | 0 refills | Status: DC

## 2023-05-09 NOTE — Telephone Encounter (Signed)
 Spoke with patient spoke with patient regarding lab results.  Beta-hCG 19, will repeat on 5/8 to ensure doubling.  Progesterone is 3.1, per OB/GYN indication for vaginal or oral progesterone therapy.  With and discussion with patient will initiate vaginal progesterone 200 mg daily.  Further recommendation no repeat progesterone level needed.  Patient continues to have some light bleeding that has not changed into her appointment yesterday and mild cramping.  Will follow-up with results from repeat beta-hCG when available.  Jonne Netters, DO

## 2023-05-10 ENCOUNTER — Other Ambulatory Visit: Payer: MEDICAID

## 2023-05-10 DIAGNOSIS — N926 Irregular menstruation, unspecified: Secondary | ICD-10-CM

## 2023-05-10 MED ORDER — PROGESTERONE MICRONIZED 100 MG PO CAPS: 100.0000 mg | ORAL_CAPSULE | Freq: Every day | ORAL | 0 refills | Status: DC

## 2023-05-11 ENCOUNTER — Inpatient Hospital Stay (HOSPITAL_COMMUNITY)
Admission: AD | Admit: 2023-05-11 | Discharge: 2023-05-11 | Disposition: A | Discharge status: Home / Self Care | Payer: MEDICAID | Attending: Obstetrics and Gynecology | Admitting: Obstetrics and Gynecology

## 2023-05-11 ENCOUNTER — Telehealth: Payer: Self-pay | Admitting: Family Medicine

## 2023-05-11 ENCOUNTER — Inpatient Hospital Stay (HOSPITAL_COMMUNITY): Payer: MEDICAID

## 2023-05-11 DIAGNOSIS — O3480 Maternal care for other abnormalities of pelvic organs, unspecified trimester: Secondary | ICD-10-CM | POA: Insufficient documentation

## 2023-05-11 DIAGNOSIS — O26899 Other specified pregnancy related conditions, unspecified trimester: Secondary | ICD-10-CM

## 2023-05-11 DIAGNOSIS — O26859 Spotting complicating pregnancy, unspecified trimester: Secondary | ICD-10-CM | POA: Diagnosis not present

## 2023-05-11 DIAGNOSIS — N83209 Unspecified ovarian cyst, unspecified side: Secondary | ICD-10-CM | POA: Diagnosis not present

## 2023-05-11 DIAGNOSIS — O34599 Maternal care for other abnormalities of gravid uterus, unspecified trimester: Secondary | ICD-10-CM | POA: Insufficient documentation

## 2023-05-11 DIAGNOSIS — R109 Unspecified abdominal pain: Secondary | ICD-10-CM | POA: Diagnosis not present

## 2023-05-11 DIAGNOSIS — O3680X0 Pregnancy with inconclusive fetal viability, not applicable or unspecified: Secondary | ICD-10-CM | POA: Diagnosis present

## 2023-05-11 DIAGNOSIS — Z3A00 Weeks of gestation of pregnancy not specified: Secondary | ICD-10-CM | POA: Diagnosis not present

## 2023-05-11 DIAGNOSIS — O26851 Spotting complicating pregnancy, first trimester: Secondary | ICD-10-CM

## 2023-05-11 LAB — COMPREHENSIVE METABOLIC PANEL WITH GFR
ALT: 16 U/L (ref 0–44)
AST: 16 U/L (ref 15–41)
Albumin: 3.9 g/dL (ref 3.5–5.0)
Alkaline Phosphatase: 56 U/L (ref 38–126)
Anion gap: 8 (ref 5–15)
BUN: 5 mg/dL — ABNORMAL LOW (ref 6–20)
CO2: 24 mmol/L (ref 22–32)
Calcium: 10 mg/dL (ref 8.9–10.3)
Chloride: 103 mmol/L (ref 98–111)
Creatinine, Ser: 0.74 mg/dL (ref 0.44–1.00)
GFR, Estimated: >60 mL/min (ref >60)
Glucose, Bld: 95 mg/dL (ref 70–99)
Potassium: 4.1 mmol/L (ref 3.5–5.1)
Sodium: 135 mmol/L (ref 135–145)
Total Bilirubin: 0.4 mg/dL (ref 0.0–1.2)
Total Protein: 7.9 g/dL (ref 6.5–8.1)

## 2023-05-11 LAB — CBC
HCT: 39.5 % (ref 36.0–46.0)
Hemoglobin: 13.2 g/dL (ref 12.0–15.0)
MCH: 29.6 pg (ref 26.0–34.0)
MCHC: 33.4 g/dL (ref 30.0–36.0)
MCV: 88.6 fL (ref 80.0–100.0)
Platelets: 344 10*3/uL (ref 150–400)
RBC: 4.46 MIL/uL (ref 3.87–5.11)
RDW: 14.1 % (ref 11.5–15.5)
WBC: 8.6 10*3/uL (ref 4.0–10.5)
nRBC: 0 % (ref 0.0–0.2)

## 2023-05-11 LAB — URINALYSIS, ROUTINE W REFLEX MICROSCOPIC
Bacteria, UA: NONE SEEN
Bilirubin Urine: NEGATIVE
Glucose, UA: NEGATIVE mg/dL
Ketones, ur: NEGATIVE mg/dL
Leukocytes,Ua: NEGATIVE
Nitrite: NEGATIVE
Protein, ur: NEGATIVE mg/dL
RBC / HPF: >50 RBC/hpf (ref 0–5)
Specific Gravity, Urine: 1.014 (ref 1.005–1.030)
pH: 7 (ref 5.0–8.0)

## 2023-05-11 LAB — WET PREP, GENITAL
Clue Cells Wet Prep HPF POC: NONE SEEN
Sperm: NONE SEEN
Trich, Wet Prep: NONE SEEN
WBC, Wet Prep HPF POC: <10 (ref <10)
Yeast Wet Prep HPF POC: NONE SEEN

## 2023-05-11 LAB — ABO/RH: ABO/RH(D): A POS

## 2023-05-11 LAB — BETA HCG QUANT (REF LAB): hCG Quant: 19 m[IU]/mL

## 2023-05-11 LAB — HCG, QUANTITATIVE, PREGNANCY: hCG, Beta Chain, Quant, S: 17 m[IU]/mL — ABNORMAL HIGH (ref <5)

## 2023-05-11 NOTE — MAU Note (Addendum)
 MAU Triage Note:  .Kristen Dean is a 23 y.o. at Unknown here in MAU reporting: vaginal bleeding that is light, but started with spotting on 05/07/23. She also reports light abdominal cramping that feels like period cramps. Denies abnormal discharge. Her office has been following her hCG and was 19 on 5/9 and 5/7. They told her to go to the hospital with increased bleeding. While in triage, patient received call from office informing her of results and instructing her to do a repeat HPT on Monday and call if she has another positive result.  Patient complaint: BLEEDING  Pain Score: 4  Pain Location: Abdomen     Onset of complaint: 05/07/2023 LMP: Patient's last menstrual period was 04/07/2023 (exact date).  Vitals:   05/11/23 1201  BP: 122/63  Pulse: 66  Resp: 16  Temp: (!) 97.2 F (36.2 C)  SpO2: 97%     Lab orders placed from triage: UA

## 2023-05-11 NOTE — Telephone Encounter (Signed)
 Spoke with patient regarding beta-hCG result.  Unfortunately lab results did not double in 48 hours, likely failed pregnancy.  Patient stated she was at [facility] for further evaluation and was being seen during phone call.  Recommended repeat home pregnancy test on Monday and if positive we will consider repeating beta-hCG level.  Will follow-up with patient next week.  Jonne Netters, DO

## 2023-05-11 NOTE — MAU Provider Note (Signed)
 Chief Complaint: Vaginal Bleeding  SUBJECTIVE HPI: Kristen Dean is a 23 y.o. [id number] at Unknown by LMP who presents to maternity admissions reporting light vaginal bleeding since 5/5 with associated light abdominal cramping 4/10 (like period cramps). Seen in the office and found to have a bHCG of 19 on 5/7 and 5/9. Current plan from the office is to repeat HPT Monday, and call if remains positive. Due to worsening bleeding and cramping she presented here.   She denies vaginal bleeding, vaginal itching/burning, urinary symptoms, h/a, dizziness, n/v, or fever/chills.    HPI  Past Medical History:  Diagnosis Date   GERD (gastroesophageal reflux disease) 08/08/2017   Tension headache 06/16/2020   Past Surgical History:  Procedure Laterality Date   NO PAST SURGERIES     Social History   Socioeconomic History   Marital status: Single    Spouse name: Not on file   Number of children: Not on file   Years of education: Not on file   Highest education level: Not on file  Occupational History   Not on file  Tobacco Use   Smoking status: Never   Smokeless tobacco: Never  Vaping Use   Vaping status: Never Used  Substance and Sexual Activity   Alcohol use: Not Currently   Drug use: No   Sexual activity: Yes    Birth control/protection: Pill  Other Topics Concern   Not on file  Social History Narrative   Not on file   Social Drivers of Health   Financial Resource Strain: Not on file  Food Insecurity: Not on file  Transportation Needs: Not on file  Physical Activity: Not on file  Stress: Not on file  Social Connections: Not on file  Intimate Partner Violence: Not on file   No current facility-administered medications on file prior to encounter.   Current Outpatient Medications on File Prior to Encounter  Medication Sig Dispense Refill   Prenatal Vit-Fe Fumarate-FA (PRENATAL 19) tablet Chew 1 tablet by mouth daily. 90 tablet 3   progesterone  (PROMETRIUM ) 100 MG capsule  Take 1 capsule (100 mg total) by mouth daily. 30 capsule 0   Allergies  Allergen Reactions   Latex Itching    ROS:  Pertinent positives/negatives listed above.  I have reviewed patient's Past Medical Hx, Surgical Hx, Family Hx, Social Hx, medications and allergies.   Physical Exam  Patient Vitals for the past 24 hrs:  BP Temp Temp src Pulse Resp SpO2 Height Weight  05/11/23 1201 122/63 (!) 97.2 F (36.2 C) Oral 66 16 97 % 5\' 8"  (1.727 m) 105.1 kg   Constitutional: Well-developed, well-nourished female in no acute distress.  Cardiovascular: normal rate Respiratory: normal effort GI: Abd soft, non-tender. Pos BS x 4 MS: Extremities nontender, no edema, normal ROM Neurologic: Alert and oriented x 4.  GU: Neg CVAT.  LAB RESULTS Results for orders placed or performed during the hospital encounter of 05/11/23 (from the past 24 hours)  Urinalysis, Routine w reflex microscopic -Urine, Clean Catch     Status: Abnormal   Collection Time: 05/11/23 12:11 PM  Result Value Ref Range   Color, Urine YELLOW YELLOW   APPearance HAZY (A) CLEAR   Specific Gravity, Urine 1.014 1.005 - 1.030   pH 7.0 5.0 - 8.0   Glucose, UA NEGATIVE NEGATIVE mg/dL   Hgb urine dipstick LARGE (A) NEGATIVE   Bilirubin Urine NEGATIVE NEGATIVE   Ketones, ur NEGATIVE NEGATIVE mg/dL   Protein, ur NEGATIVE NEGATIVE mg/dL   Nitrite NEGATIVE NEGATIVE  Leukocytes,Ua NEGATIVE NEGATIVE   RBC / HPF >50 0 - 5 RBC/hpf   WBC, UA 0-5 0 - 5 WBC/hpf   Bacteria, UA NONE SEEN NONE SEEN   Squamous Epithelial / HPF 0-5 0 - 5 /HPF  Wet prep, genital     Status: None   Collection Time: 05/11/23 12:11 PM   Specimen: Urine, Clean Catch  Result Value Ref Range   Yeast Wet Prep HPF POC NONE SEEN NONE SEEN   Trich, Wet Prep NONE SEEN NONE SEEN   Clue Cells Wet Prep HPF POC NONE SEEN NONE SEEN   WBC, Wet Prep HPF POC <10 <10   Sperm NONE SEEN   ABO/Rh     Status: None   Collection Time: 05/11/23  1:44 PM  Result Value Ref Range    ABO/RH(D) A POS    No rh immune globuloin      NOT A RH IMMUNE GLOBULIN CANDIDATE, PT RH POSITIVE Performed at [facility], [address], [place], [place] [address]   CBC     Status: None   Collection Time: 05/11/23  1:46 PM  Result Value Ref Range   WBC 8.6 4.0 - 10.5 K/uL   RBC 4.46 3.87 - 5.11 MIL/uL   Hemoglobin 13.2 12.0 - 15.0 g/dL   HCT 82.9 56.2 - 13.0 %   MCV 88.6 80.0 - 100.0 fL   MCH 29.6 26.0 - 34.0 pg   MCHC 33.4 30.0 - 36.0 g/dL   RDW 86.5 78.4 - 69.6 %   Platelets 344 150 - 400 K/uL   nRBC 0.0 0.0 - 0.2 %  Comprehensive metabolic panel with GFR     Status: Abnormal   Collection Time: 05/11/23  1:46 PM  Result Value Ref Range   Sodium 135 135 - 145 mmol/L   Potassium 4.1 3.5 - 5.1 mmol/L   Chloride 103 98 - 111 mmol/L   CO2 24 22 - 32 mmol/L   Glucose, Bld 95 70 - 99 mg/dL   BUN 5 (L) 6 - 20 mg/dL   Creatinine, Ser 2.95 0.44 - 1.00 mg/dL   Calcium 28.4 8.9 - 13.2 mg/dL   Total Protein 7.9 6.5 - 8.1 g/dL   Albumin 3.9 3.5 - 5.0 g/dL   AST 16 15 - 41 U/L   ALT 16 0 - 44 U/L   Alkaline Phosphatase 56 38 - 126 U/L   Total Bilirubin 0.4 0.0 - 1.2 mg/dL   GFR, Estimated >44 >01 mL/min   Anion gap 8 5 - 15  hCG, quantitative, pregnancy     Status: Abnormal   Collection Time: 05/11/23  1:46 PM  Result Value Ref Range   hCG, Beta Chain, Quant, S 17 (H) <5 mIU/mL    --/--/A POS (05/09 1344)  IMAGING US  OB LESS THAN 14 WEEKS WITH OB TRANSVAGINAL Result Date: 05/11/2023 CLINICAL DATA:  Light bleeding for a few days.  Beta HCG of 17 EXAM: OBSTETRIC <14 WK US  AND TRANSVAGINAL OB US  TECHNIQUE: Both transabdominal and transvaginal ultrasound examinations were performed for complete evaluation of the gestation as well as the maternal uterus, adnexal regions, and pelvic cul-de-sac. Transvaginal technique was performed to assess early pregnancy. COMPARISON:  None Available. FINDINGS: Uterus and endometrium: Retroverted uterus identified. Slightly heterogeneous  myometrium. Endometrial stripe of 10 mm. No intrauterine pregnancy identified. Nabothian cysts are seen. Ovaries: Left ovary has small follicles. Left ovary measures 2.1 x 1.7 x 2.4 cm. The right ovary also has small follicles and  measures 3.6 x 2.0 x 3.7 cm. Small amount of free fluid in the pelvis.  No separate adnexal mass. IMPRESSION: Retroverted uterus. No intrauterine pregnancy. Small amount of free fluid in the pelvis. In the setting of a positive pregnancy test and no IUP, ectopic in principle cannot be entirely excluded and recommend close follow-up with serial beta HCG and ultrasound. Electronically Signed   By: Adrianna Horde M.D.   On: 05/11/2023 15:48    MAU Management/MDM: Orders Placed This Encounter  Procedures   Wet prep, genital   US  OB LESS THAN 14 WEEKS WITH OB TRANSVAGINAL   Urinalysis, Routine w reflex microscopic -Urine, Clean Catch   CBC   Comprehensive metabolic panel with GFR   hCG, quantitative, pregnancy   ABO/Rh   Discharge patient Discharge disposition: 01-Home or Self Care; Discharge patient date: 05/11/2023    No orders of the defined types were placed in this encounter.  ASSESSMENT 1. Pregnancy of unknown anatomic location   2. Spotting affecting pregnancy in first trimester   3. Cramping affecting pregnancy, antepartum   [place] work up - bHCG 17, inconclusive US  no IUP. Small free fluid in pelvis.   Clinically well appearing. Hemodynamically stable.  Continue with clinic plan: HPT Monday, call office if remains positive.  Highly suspicious of failed pregnancy.  Recommend recurrent pregnancy loss work up out patient  PLAN Discharge home with strict return precautions.  Allergies as of 05/11/2023       Reactions   Latex Itching        Medication List     TAKE these medications    Prenatal 19 tablet Chew 1 tablet by mouth daily.   progesterone  100 MG capsule Commonly known as: Prometrium  Take 1 capsule (100 mg total) by mouth daily.         Follow-up Information     Jonne Netters, MD. Schedule an appointment as soon as possible for a visit.   Specialty: Family Medicine Why: As needed Contact information: [address] [place] [address] [phone]                [place], MD FMOB Fellow, Faculty practice [facility], [facility] for [facility]  05/11/2023  5:11 PM

## 2023-05-14 LAB — GC/CHLAMYDIA PROBE AMP (~~LOC~~) NOT AT ARMC
Chlamydia: NEGATIVE
Comment: NEGATIVE
Comment: NORMAL
Neisseria Gonorrhea: NEGATIVE

## 2023-05-18 NOTE — Addendum Note (Signed)
 Addended by: Jonne Netters on: 05/18/2023 09:06 AM   Modules accepted: Orders

## 2023-06-09 ENCOUNTER — Ambulatory Visit
Admission: RE | Admit: 2023-06-09 | Discharge: 2023-06-09 | Disposition: A | Discharge status: Home / Self Care | Payer: MEDICAID | Source: Ambulatory Visit | Attending: Family Medicine

## 2023-06-09 VITALS — BP 125/78 | HR 83 | Temp 98.4°F | Resp 16

## 2023-06-09 DIAGNOSIS — N760 Acute vaginitis: Secondary | ICD-10-CM | POA: Insufficient documentation

## 2023-06-09 DIAGNOSIS — Z3201 Encounter for pregnancy test, result positive: Secondary | ICD-10-CM | POA: Diagnosis present

## 2023-06-09 LAB — POCT URINALYSIS DIP (MANUAL ENTRY)
Bilirubin, UA: NEGATIVE
Blood, UA: NEGATIVE
Glucose, UA: NEGATIVE mg/dL
Ketones, POC UA: NEGATIVE mg/dL
Leukocytes, UA: NEGATIVE
Nitrite, UA: NEGATIVE
Protein Ur, POC: NEGATIVE mg/dL
Spec Grav, UA: 1.02 (ref 1.010–1.025)
Urobilinogen, UA: 0.2 U/dL
pH, UA: 6 (ref 5.0–8.0)

## 2023-06-09 LAB — POCT URINE PREGNANCY: Preg Test, Ur: POSITIVE — AB

## 2023-06-09 NOTE — ED Provider Notes (Signed)
 [facility] - URGENT CARE CENTER  Note:  This document was prepared using Conservation officer, historic buildings and may include unintentional dictation errors.  MRN: [id number] DOB: 10-21-2000  Subjective:   Kristen Dean is a 23 y.o. female presenting for 1 to 2-week history of urinary frequency, 3-day history of recurrent vaginal discharge.  Patient has a history of BV and yeast infection.  Would like STI testing and UTI check.  Of note, patient has a well-documented failed pregnancy in May.  Reports that she was advised to monitor pregnancy test at home.  She has done 2 pregnancy tests and both were negative just days before coming into our clinic.  No current facility-administered medications for this encounter.  Current Outpatient Medications:    Prenatal Vit-Fe Fumarate-FA (PRENATAL 19) tablet, Chew 1 tablet by mouth daily., Disp: 90 tablet, Rfl: 3   progesterone  (PROMETRIUM ) 100 MG capsule, Take 1 capsule (100 mg total) by mouth daily., Disp: 30 capsule, Rfl: 0   Allergies  Allergen Reactions   Latex Itching    Past Medical History:  Diagnosis Date   GERD (gastroesophageal reflux disease) 08/08/2017   Tension headache 06/16/2020     Past Surgical History:  Procedure Laterality Date   NO PAST SURGERIES      Family History  Problem Relation Age of Onset   Diabetes Mother    Hypertension Mother    Diabetes Sister    Mental retardation Sister    Heart disease Maternal Aunt    Diabetes Maternal Aunt    Diabetes Maternal Uncle    Diabetes Maternal Grandmother    Heart disease Maternal Grandfather    Diabetes Maternal Grandfather    Colon cancer Neg Hx    Esophageal cancer Neg Hx    Pancreatic cancer Neg Hx    Stomach cancer Neg Hx     Social History   Tobacco Use   Smoking status: Never   Smokeless tobacco: Never  Vaping Use   Vaping status: Never Used  Substance Use Topics   Alcohol use: Not Currently   Drug use: No    ROS   Objective:    Vitals: BP 125/78 (BP Location: Right Arm)   Pulse 83   Temp 98.4 F (36.9 C) (Oral)   Resp 16   LMP  (LMP Unknown) Comment: reports miscarriage 05/11/23  SpO2 98%   Physical Exam Constitutional:      General: She is not in acute distress.    Appearance: Normal appearance. She is well-developed. She is not ill-appearing, toxic-appearing or diaphoretic.  HENT:     Head: Normocephalic and atraumatic.     Nose: Nose normal.     Mouth/Throat:     Mouth: Mucous membranes are moist.  Eyes:     General: No scleral icterus.       Right eye: No discharge.        Left eye: No discharge.     Extraocular Movements: Extraocular movements intact.     Conjunctiva/sclera: Conjunctivae normal.  Cardiovascular:     Rate and Rhythm: Normal rate.  Pulmonary:     Effort: Pulmonary effort is normal.  Abdominal:     General: Bowel sounds are normal. There is no distension.     Palpations: Abdomen is soft. There is no mass.     Tenderness: There is no abdominal tenderness. There is no right CVA tenderness, left CVA tenderness, guarding or rebound.  Skin:    General: Skin is warm and dry.  Neurological:  General: No focal deficit present.     Mental Status: She is alert and oriented to person, place, and time.  Psychiatric:        Mood and Affect: Mood normal.        Behavior: Behavior normal.        Thought Content: Thought content normal.        Judgment: Judgment normal.     Results for orders placed or performed during the hospital encounter of 06/09/23 (from the past 24 hours)  POCT urinalysis dipstick     Status: None   Collection Time: 06/09/23  9:54 AM  Result Value Ref Range   Color, UA yellow yellow   Clarity, UA clear clear   Glucose, UA negative negative mg/dL   Bilirubin, UA negative negative   Ketones, POC UA negative negative mg/dL   Spec Grav, UA 2.956 2.130 - 1.025   Blood, UA negative negative   pH, UA 6.0 5.0 - 8.0   Protein Ur, POC negative negative mg/dL    Urobilinogen, UA 0.2 0.2 or 1.0 E.U./dL   Nitrite, UA Negative Negative   Leukocytes, UA Negative Negative  POCT urine pregnancy     Status: Abnormal   Collection Time: 06/09/23  9:54 AM  Result Value Ref Range   Preg Test, Ur Positive (A) Negative    Assessment and Plan :   PDMP not reviewed this encounter.  1. Positive pregnancy test   2. Acute vaginitis    Patient declined empiric treatment and I am in agreement.  Labs pending, will treat as appropriate based off of lab results.  Patient has a positive pregnancy test in our clinic today.  It is difficult to discern whether or not this is new pregnancy versus lingering from her failed pregnancy just a few weeks ago.  Emphasized need for follow-up with her OB/GYN as soon as possible.  Counseled patient on potential for adverse effects with medications prescribed/recommended today, ER and return-to-clinic precautions discussed, patient verbalized understanding.    Adolph Hoop, [place] 06/09/23 1306

## 2023-06-09 NOTE — Discharge Instructions (Addendum)
 We will hold off on empiric treatment and based your treatment off of the test results.  As you have had a positive pregnancy test today I highly recommend that you follow-up with your OB/GYN as soon as possible.  This is not necessarily an indicator of a new pregnancy given your recent history.  But please make sure you follow-up with your OB/GYN for a consultation.  In the meantime you are welcome to start a prenatal vitamin daily.

## 2023-06-09 NOTE — ED Triage Notes (Signed)
 Pt c/o vaginal d/c x 3 days-also c/o urinary freq x 1-2 weeks-NAD-steady gait

## 2023-06-11 ENCOUNTER — Ambulatory Visit: Payer: MEDICAID | Admitting: Student

## 2023-06-11 VITALS — BP 117/69 | HR 76 | Ht 68.0 in | Wt 236.4 lb

## 2023-06-11 DIAGNOSIS — N96 Recurrent pregnancy loss: Secondary | ICD-10-CM

## 2023-06-11 DIAGNOSIS — Z3201 Encounter for pregnancy test, result positive: Secondary | ICD-10-CM | POA: Diagnosis not present

## 2023-06-11 LAB — CERVICOVAGINAL ANCILLARY ONLY
Bacterial Vaginitis (gardnerella): NEGATIVE
Candida Glabrata: NEGATIVE
Candida Vaginitis: NEGATIVE
Chlamydia: NEGATIVE
Comment: NEGATIVE
Comment: NEGATIVE
Comment: NEGATIVE
Comment: NEGATIVE
Comment: NEGATIVE
Comment: NORMAL
Neisseria Gonorrhea: NEGATIVE
Trichomonas: NEGATIVE

## 2023-06-11 NOTE — Progress Notes (Signed)
    SUBJECTIVE:   CHIEF COMPLAINT / HPI:   Positive Pregnancy Test  Recurrent Miscarriage Patient here to follow-up after being seen at urgent care for a positive urine pregnancy test 2 days ago.  She had a miscarriage about a month ago, so there was some question as to whether this urine test represented a true new pregnancy versus a persistent positive after her miscarriage 4 weeks ago.  Though note, that she had two negative home pregnancy tests prior to one coming up positive prior to her UC presentation.  She is feeling well otherwise, without any abdominal pain or bleeding. This pregnancy is unintended, though welcome.  She would like to pursue her prenatal care at [facility], though we did discuss that with her history of 3 recurrent miscarriages she meets our criteria for transfer to high risk OB.  OBJECTIVE:   BP 117/69   Pulse 76   Ht 5' 8 (1.727 m)   Wt 236 lb 6.4 oz (107.2 kg)   LMP 04/07/2023 Comment: reports miscarriage 05/11/23  SpO2 99%   BMI 35.94 kg/m   General: alert & oriented, no apparent distress, well groomed HEENT: normocephalic, atraumatic, EOM grossly intact, oral mucosa moist, neck supple Respiratory: normal respiratory effort GI: non-distended Skin: no rashes, no jaundice Psych: appropriate mood and affect   ASSESSMENT/PLAN:   Assessment & Plan Positive pregnancy test With her 2 negative tests at home prior to urine hCG returning positive, I do suspect this represents a new pregnancy, though will obtain a serum hCG to be sure.  Most recent value obtained was 14. -Serum hCG quant History of recurrent miscarriages X 3.  This makes her a high risk OB patient, and unfortunately will not be able to supervise her pregnancy here at [facility]. -Referral made to high risk OB -TSH, anti-TPO, lupus anticoagulant, anticardiolipin antibody     J Lark Plum, MD [facility]

## 2023-06-11 NOTE — Patient Instructions (Addendum)
 Ms. Bulluck,  Great to see you. Thanks for coming in!  I am hopeful that these two urine pregnancy tests mean that you are indeed pregnant, but given the proximity to your last miscarriage, it is worth us  looking into a bit more with a blood pregnancy test. I'm also going to get the ball rolling on some blood testing for your three miscarriages. Because of these, I recommend that we transfer any prenatal care to our colleagues who specialize in high risk obstetrics. They will call you to set up an appointment.  I will call you tomorrow to follow-up your labs from today.  [name], MD

## 2023-06-12 ENCOUNTER — Ambulatory Visit: Payer: Self-pay | Admitting: Student

## 2023-06-12 ENCOUNTER — Encounter: Payer: Self-pay | Admitting: *Deleted

## 2023-06-12 LAB — SPECIMEN STATUS REPORT

## 2023-06-14 ENCOUNTER — Ambulatory Visit: Payer: MEDICAID | Admitting: Student

## 2023-06-14 ENCOUNTER — Other Ambulatory Visit (HOSPITAL_COMMUNITY)
Admission: RE | Admit: 2023-06-14 | Discharge: 2023-06-14 | Disposition: A | Discharge status: Home / Self Care | Payer: MEDICAID | Source: Ambulatory Visit | Attending: Family Medicine | Admitting: Family Medicine

## 2023-06-14 VITALS — BP 114/66 | HR 64 | Ht 68.0 in | Wt 235.4 lb

## 2023-06-14 DIAGNOSIS — O26891 Other specified pregnancy related conditions, first trimester: Secondary | ICD-10-CM | POA: Insufficient documentation

## 2023-06-14 DIAGNOSIS — N898 Other specified noninflammatory disorders of vagina: Secondary | ICD-10-CM | POA: Diagnosis not present

## 2023-06-14 DIAGNOSIS — Z3A01 Less than 8 weeks gestation of pregnancy: Secondary | ICD-10-CM

## 2023-06-14 LAB — POCT WET PREP (WET MOUNT)
Clue Cells Wet Prep Whiff POC: NEGATIVE
Trichomonas Wet Prep HPF POC: ABSENT

## 2023-06-14 NOTE — Patient Instructions (Addendum)
 It was great to see you! Thank you for allowing me to participate in your care!   I recommend that you always bring your medications to each appointment as this makes it easy to ensure we are on the correct medications and helps us  not miss when refills are needed.  Our plans for today:  - You will receive a call from OB/GYN for further prenatal care - Today we have collected prenatal care labs, we will follow these up for you. - Additionally, we have ordered a pregnancy dating ultrasound as we cannot rely on your last menstrual period with this possible miscarriage in May. - If there are any concerning findings on your labs, I will call you.  Otherwise I will comment on them through MyChart.  Take care and seek immediate care sooner if you develop any concerns. Please remember to show up 15 minutes before your scheduled appointment time!  Lavada Porteous, DO [facility]  Pregnancy Related Return Precautions -If you develop severe cramping, new bleeding, fevers, severe nausea/vomiting please go to the MAU.  For any pregnancy-related emergencies, please go to the Maternity Admissions Unit in the [facility] at [facility]. You will use hospital Entrance C.    Our clinic number is [phone].   Dr Drue Gerald

## 2023-06-14 NOTE — Progress Notes (Signed)
    SUBJECTIVE:   CHIEF COMPLAINT / HPI:   Vaginal Discharge  Pregnancy Scant, clear/yellow discharge.  No vaginal discomfort, no odor.  Denies bleeding.  Does have intermittent cramping.  No fevers, chills, NVD.  No dysuria nor difficulty urinating.  Her LMP was April 5.  However, she had a possible miscarriage (vaginal bleeding) about a month ago, at that time beta-hCG trended 19 -> 19.  See OB/GYN provider note from MAU for details.  Bleeding had resolved.  No found to have rising beta-hCG levels, believed to be new pregnancy.  OBJECTIVE:   BP 114/66   Pulse 64   Ht 5' 8 (1.727 m)   Wt 235 lb 6.4 oz (106.8 kg)   LMP 04/07/2023 Comment: reports miscarriage 05/11/23  SpO2 99%   BMI 35.79 kg/m    General: NAD, pleasant, able to participate in exam Respiratory: Normal effort, no obvious respiratory distress Pelvic: VULVA: normal appearing vulva with no masses, tenderness or lesions, VAGINA: Normal appearing vagina with normal color, no lesions, with scant and white discharge present. CERVIX: No lesions, scant and white discharge present  Chaperone Celestine Colder RN  present for pelvic exam  ASSESSMENT/PLAN:   Assessment & Plan Less than [redacted] weeks gestation of pregnancy Suspect new pregnancy, cannot rely on LMP given possible miscarriage in between last LMP and rise in beta-hCG.  History of miscarriage x 3. - Initial OB labs obtained today - Referral to high risk OB placed - Pending anti-TPO, lupus anticoagulant, anticardiolipin antibody - Repeat beta-hCG, ensure appropriate rise - Dating ultrasound scheduled for approximately 2 weeks - Strict return/MAU precautions provided to patient Vaginal discharge during pregnancy in first trimester Asymptomatic aside from scant discharge. - GC/committee a swab - Wet mount today   Kristen Porteous, DO [facility]

## 2023-06-15 ENCOUNTER — Ambulatory Visit: Payer: Self-pay | Admitting: Student

## 2023-06-15 LAB — CBC/D/PLT+RPR+RH+ABO+RUBIGG...

## 2023-06-15 LAB — CERVICOVAGINAL ANCILLARY ONLY
Chlamydia: NEGATIVE
Comment: NEGATIVE
Comment: NEGATIVE
Comment: NORMAL
Neisseria Gonorrhea: NEGATIVE
Trichomonas: NEGATIVE

## 2023-06-17 LAB — CBC/D/PLT+RPR+RH+ABO+RUBIGG...
Antibody Screen: NEGATIVE
Basos: 1 %
EOS (ABSOLUTE): 0.1 10*3/uL (ref 0.0–0.2)
Eos: 1 %
Glucose, UA: NEGATIVE
Glucose, UA: NEGATIVE
HCV Ab: NONREACTIVE
HIV Screen 4th Generation wRfx: NONREACTIVE
Hematocrit: 43.3 % (ref 34.0–46.6)
Hemoglobin: 13.7 g/dL (ref 11.1–15.9)
Hepatitis B Surface Ag: NEGATIVE
Immature Granulocytes: 0 %
Immature Granulocytes: 0 10*3/uL (ref 0.0–0.1)
Lymphs: 31 %
MCH: 29.7 pg (ref 26.6–33.0)
MCHC: 31.6 g/dL (ref 31.5–35.7)
MCV: 94 fL (ref 79–97)
Microscopic Examination: NEGATIVE
Monocytes Absolute: 0.1 10*3/uL (ref 0.0–0.4)
Monocytes Absolute: 0.6 10*3/uL (ref 0.1–0.9)
Monocytes: 6 %
Neutrophils Absolute: 3 10*3/uL (ref 0.7–3.1)
Neutrophils Absolute: 5.8 10*3/uL (ref 1.4–7.0)
Neutrophils: 61 %
Platelets: 422 10*3/uL (ref 150–450)
RBC, UA: NEGATIVE
RBC, UA: NEGATIVE
RBC: 4.62 x10E6/uL (ref 3.77–5.28)
RDW: 13.1 % (ref 11.7–15.4)
RPR Ser Ql: NONREACTIVE
Rh Factor: POSITIVE
Rubella Antibodies, IGG: 9.36 {index} (ref >0.99)
Specific Gravity, UA: 1.018
Specific Gravity, UA: 1.018 (ref 1.005–1.030)
Urine Culture, OB: NEGATIVE
Urobilinogen, Ur: 0.2 mg/dL (ref 0.2–1.0)
Urobilinogen, Ur: 0.2 mg/dL — AB
WBC: 9.5 10*3/uL (ref 3.4–10.8)
pH, UA: 6
pH, UA: 6 (ref 5.0–7.5)

## 2023-06-17 LAB — HGB FRACTIONATION CASCADE
Hgb A2: 2.4 % (ref 1.8–3.2)
Hgb A: 97.6 % (ref 96.4–98.8)
Hgb F: 0 % (ref 0.0–2.0)
Hgb S: 0 %

## 2023-06-17 LAB — MICROSCOPIC EXAMINATION
Bacteria, UA: NONE SEEN
Casts: NONE SEEN /LPF
Epithelial Cells (non renal): NONE SEEN /HPF (ref 0–10)
WBC, UA: NONE SEEN /HPF (ref 0–5)

## 2023-06-17 LAB — HCV INTERPRETATION

## 2023-06-17 LAB — URINE CULTURE, OB REFLEX

## 2023-06-17 LAB — BETA HCG QUANT (REF LAB): hCG Quant: 2006 m[IU]/mL

## 2023-06-19 ENCOUNTER — Ambulatory Visit (INDEPENDENT_AMBULATORY_CARE_PROVIDER_SITE_OTHER): Payer: MEDICAID | Admitting: Student

## 2023-06-19 VITALS — BP 111/66 | HR 94 | Ht 68.0 in | Wt 233.0 lb

## 2023-06-19 DIAGNOSIS — Z3481 Encounter for supervision of other normal pregnancy, first trimester: Secondary | ICD-10-CM

## 2023-06-19 DIAGNOSIS — N898 Other specified noninflammatory disorders of vagina: Secondary | ICD-10-CM

## 2023-06-19 DIAGNOSIS — Z3A01 Less than 8 weeks gestation of pregnancy: Secondary | ICD-10-CM | POA: Diagnosis not present

## 2023-06-19 DIAGNOSIS — O0991 Supervision of high risk pregnancy, unspecified, first trimester: Secondary | ICD-10-CM

## 2023-06-19 NOTE — Patient Instructions (Addendum)
 Kristen Dean,  It is great to see you again! The discharge you showed me photos of appears to be what we would call physiologic discharge of pregnancy and is not something that I think we need to be concerned about right now. Of course, if you have bleeding or pain, please go to the MAU.  I am eager to see what your ultrasound shows next week as this should help us  to more accurately date your pregnancy.  [name], MD

## 2023-06-19 NOTE — Progress Notes (Signed)
    SUBJECTIVE:   CHIEF COMPLAINT / HPI:   Vaginal Discharge White discharge since last week. She is very early in current pregnancy (roughly 5 weeks) awaiting dating ultrasound which is scheduled for next week. She was having similar symptoms last week when she saw Dr. Telford Feather for her new OB visit. Wet prep, GC/Chlamydia testing were all WNL at that time. She denies any pelvic pain or bleeding associated with these symptoms. She brings in photos of the present discharge.  She tells me that she had similar discharge in the first trimester of pregnancy with her first child. Of note, she does have a history of recurrent miscarriage, most recently in May 2025.  She has had a total of 3 miscarriages.  She has therefore been referred to high risk OB for continuation of pregnancy care.    OBJECTIVE:   BP 111/66   Pulse 94   Ht 5' 8 (1.727 m)   Wt 233 lb (105.7 kg)   LMP 04/07/2023 Comment: reports miscarriage 05/11/23  SpO2 100%   BMI 35.43 kg/m   General: alert & oriented, no apparent distress, well groomed HEENT: normocephalic, atraumatic, EOM grossly intact, oral mucosa moist, neck supple Respiratory: normal respiratory effort GI: non-distended Skin: no rashes, no jaundice Psych: appropriate mood and affect  Photo of discharge provided by patient.    ASSESSMENT/PLAN:   Assessment & Plan Leukorrhea Photos that patient brings in are consistent with physiologic discharge of pregnancy.  No evidence of bleeding/spotting.  Were she to develop any bleeding or pelvic pain, she is aware that she should proceed to the MAU for urgent ultrasound. -Wet prep and GC/Chlamydia normal last week  - Reassurance provided -Dating ultrasound scheduled for next week -Has been referred to high risk OB given history of recurrent miscarriage     J Lark Plum, MD [facility]

## 2023-06-21 ENCOUNTER — Ambulatory Visit
Admission: EM | Admit: 2023-06-21 | Discharge: 2023-06-21 | Disposition: A | Discharge status: Home / Self Care | Payer: MEDICAID | Attending: Emergency Medicine | Admitting: Emergency Medicine

## 2023-06-21 DIAGNOSIS — N898 Other specified noninflammatory disorders of vagina: Secondary | ICD-10-CM | POA: Diagnosis not present

## 2023-06-21 MED ORDER — MICONAZOLE 7 100 MG VA SUPP: 100.0000 mg | Freq: Every day | VAGINAL | 0 refills | Status: DC

## 2023-06-21 NOTE — Discharge Instructions (Signed)
 Today you are being treated  for yeast.   Insert miconazole  suppository every night before bed for 7 days  Yeast infections which are caused by a naturally occurring fungus called candida. Vaginosis is an inflammation of the vagina that can result in discharge, itching and pain. The cause is usually a change in the normal balance of vaginal bacteria or an infection. Vaginosis can also result from reduced estrogen levels after menopause.  Labs pending 2-3 days, you will be contacted if positive additional medicine sent to pharmacy at time of notification  Please refrain from having sex until labs results, if positive please refrain from having sex until treatment complete and symptoms resolve    In addition:   Avoid baths, hot tubs and whirlpool spas.  Don't use scented or harsh soaps, such as those with deodorant or antibacterial action. Avoid irritants. These include scented tampons and pads. Wipe from front to back after using the toilet.  Don't douche. Your vagina doesn't require cleansing other than normal bathing.  Use a  condom. Wear cotton underwear, this fabric helps absorb moisture
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
## 2023-06-21 NOTE — ED Triage Notes (Signed)
 Pt present with c/o vaginal white discharge and irritation x one week. Pt states she had symptoms earlier this month and now feels worse.

## 2023-06-22 LAB — CERVICOVAGINAL ANCILLARY ONLY
Bacterial Vaginitis (gardnerella): NEGATIVE
Candida Glabrata: NEGATIVE
Candida Vaginitis: NEGATIVE
Comment: NEGATIVE
Comment: NEGATIVE
Comment: NEGATIVE

## 2023-06-26 LAB — LUPUS ANTICOAG/CARDIOLIPIN AB
APTT: 26.2 s
Anticardiolipin Ab, IgG: <10 [GPL'U]
Anticardiolipin Ab, IgM: <10 [MPL'U]
Beta-2 Glycoprotein I, IgA: <10 SAU
Beta-2 Glycoprotein I, IgG: <10 SGU
Beta-2 Glycoprotein I, IgM: <10 SMU
DRVVT Screen Seconds: 33.8 s
Hexagonal Phospholipid Neutral: 5 s
INR: 1 ratio
Prothrombin Time: 10.9 s
Thrombin Time: 16.6 s

## 2023-06-26 LAB — ANTI-TPO AB (RDL): Anti-TPO Ab (RDL): <9 [IU]/mL (ref <9.0)

## 2023-06-26 LAB — BETA HCG QUANT (REF LAB): hCG Quant: 535 m[IU]/mL

## 2023-06-26 LAB — TSH RFX ON ABNORMAL TO FREE T4: TSH: 2.59 u[IU]/mL (ref 0.450–4.500)

## 2023-06-28 ENCOUNTER — Ambulatory Visit (INDEPENDENT_AMBULATORY_CARE_PROVIDER_SITE_OTHER): Payer: MEDICAID

## 2023-06-28 DIAGNOSIS — Z3491 Encounter for supervision of normal pregnancy, unspecified, first trimester: Secondary | ICD-10-CM

## 2023-06-28 DIAGNOSIS — Z3A01 Less than 8 weeks gestation of pregnancy: Secondary | ICD-10-CM | POA: Diagnosis not present

## 2023-07-05 ENCOUNTER — Ambulatory Visit (INDEPENDENT_AMBULATORY_CARE_PROVIDER_SITE_OTHER): Payer: MEDICAID | Admitting: Student

## 2023-07-05 ENCOUNTER — Telehealth: Payer: Self-pay

## 2023-07-05 VITALS — BP 115/71 | HR 72 | Wt 228.6 lb

## 2023-07-05 DIAGNOSIS — N930 Postcoital and contact bleeding: Secondary | ICD-10-CM | POA: Diagnosis not present

## 2023-07-05 NOTE — Patient Instructions (Addendum)
 Pleasure to meet you today.  Your pelvic exam did not show any bleeding from your cervix  However given your pregnancy will do a transvaginal ultrasound which has been ordered.  Most will call you to schedule an appointment.  Also have ordered lab for CBC and HCG.

## 2023-07-05 NOTE — Progress Notes (Signed)
    SUBJECTIVE:   CHIEF COMPLAINT / HPI:   Kristen Dean is a 23 year old female who presents with spotting after intercourse during pregnancy. She is accompanied by her young child.  She experiences spotting after intercourse, which began immediately during the act and continued into the next day. The spotting started yesterday morning and persisted into the night, with some spotting observed earlier today. It occurs only when wiping and is not heavy enough to appear on a pad. There is no significant bleeding, abdominal pain, or unusual discharge. This is her second pregnancy, and she has not experienced bleeding during intercourse in the past.  PERTINENT  PMH / PSH: Reviewed   OBJECTIVE:   BP 115/71   Pulse 72   Wt 228 lb 9.6 oz (103.7 kg)   LMP 04/07/2023 Comment: reports miscarriage 05/11/23  BMI 34.76 kg/m    Physical Exam General: Alert, well appearing, NAD Cardiovascular: RRR, No Murmurs, Normal S2/S2 Respiratory: CTAB, No wheezing or Rales Abdomen: No distension or tenderness Extremities: No edema on extremities   Genitalia:  Normal introitus for age, no external lesions, no vaginal discharge with mild odor, mucosa pink and moist, no vaginal or cervical lesions, no vaginal atrophy  CMA Harlene served as Chaperon    ASSESSMENT/PLAN:   Spotting during pregnancy Increased vascularization during pregnancy can cause spotting. Light spotting only when wiping is reassuring. - Perform pelvic examination to assess cause. - Ordered OB US  <14wks, CBC and bHCG given concern for first trimester bleeding - Monitor for changes in spotting. - Instruct to return if spotting persists or worsens.   Norleen April, MD [facility]

## 2023-07-05 NOTE — Telephone Encounter (Signed)
 Called centralized scheduling and spoke with Tillman to schedule an OB US  for patient. While patient was in office scheduled patient for 07/12/2023 with arrival time being at 9am and appointment time being at 9:30am at [facility].  Provided patient with handwritten information about the US  appointment, the time and the location. Patient must drink 32oz of water 30 minutes prior to arrival time (start at 8:30am) Patient must have a full bladder.  Patient was appreciative.  Harlene Reiter, CMA

## 2023-07-06 LAB — CBC
Hematocrit: 41.1 % (ref 34.0–46.6)
Hemoglobin: 13.6 g/dL (ref 11.1–15.9)
MCH: 30.8 pg (ref 26.6–33.0)
MCHC: 33.1 g/dL (ref 31.5–35.7)
MCV: 93 fL (ref 79–97)
Platelets: 341 x10E3/uL (ref 150–450)
RBC: 4.42 x10E6/uL (ref 3.77–5.28)
RDW: 13.2 % (ref 11.7–15.4)
WBC: 11.1 x10E3/uL — ABNORMAL HIGH (ref 3.4–10.8)

## 2023-07-06 LAB — BETA HCG QUANT (REF LAB): hCG Quant: 88002 m[IU]/mL

## 2023-07-12 ENCOUNTER — Ambulatory Visit (HOSPITAL_COMMUNITY)
Admission: RE | Admit: 2023-07-12 | Discharge: 2023-07-12 | Disposition: A | Discharge status: Home / Self Care | Payer: MEDICAID | Source: Ambulatory Visit | Attending: Family Medicine | Admitting: Family Medicine

## 2023-07-12 ENCOUNTER — Other Ambulatory Visit: Payer: Self-pay | Admitting: Family Medicine

## 2023-07-12 DIAGNOSIS — N930 Postcoital and contact bleeding: Secondary | ICD-10-CM | POA: Insufficient documentation

## 2023-07-18 ENCOUNTER — Ambulatory Visit: Payer: Self-pay | Admitting: Student

## 2023-07-24 ENCOUNTER — Telehealth: Payer: MEDICAID

## 2023-07-24 DIAGNOSIS — Z3A10 10 weeks gestation of pregnancy: Secondary | ICD-10-CM | POA: Diagnosis not present

## 2023-07-24 DIAGNOSIS — O0991 Supervision of high risk pregnancy, unspecified, first trimester: Secondary | ICD-10-CM

## 2023-07-24 DIAGNOSIS — O0990 Supervision of high risk pregnancy, unspecified, unspecified trimester: Secondary | ICD-10-CM | POA: Insufficient documentation

## 2023-07-24 MED ORDER — BLOOD PRESSURE KIT DEVI: 1.0000 | 0 refills | Status: AC | PRN
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
## 2023-07-24 NOTE — Patient Instructions (Signed)
 Safe Medications in Pregnancy   Acne:  Benzoyl Peroxide  Salicylic Acid   Backache/Headache:  Tylenol: 2 regular strength every 4 hours OR               2 Extra strength every 6 hours   Colds/Coughs/Allergies:  Benadryl (alcohol free) 25 mg every 6 hours as needed  Breath right strips  Claritin  Cepacol throat lozenges  Chloraseptic throat spray  Cold-Eeze- up to three times per day  Cough drops, alcohol free  Flonase (by prescription only)  Guaifenesin  Mucinex  Robitussin DM (plain only, alcohol free)  Saline nasal spray/drops  Sudafed (pseudoephedrine) & Actifed * use only after [redacted] weeks gestation and if you do not have high blood pressure  Tylenol  Vicks Vaporub  Zinc lozenges  Zyrtec   Constipation:  Colace  Ducolax suppositories  Fleet enema  Glycerin suppositories  Metamucil  Milk of magnesia  Miralax  Senokot  Smooth move tea   Diarrhea:  Kaopectate  Imodium A-D   *NO pepto Bismol   Hemorrhoids:  Anusol  Anusol HC  Preparation H  Tucks   Indigestion:  Tums  Maalox  Mylanta  Zantac  Pepcid   Insomnia:  Benadryl (alcohol free) 25mg  every 6 hours as needed  Tylenol PM  Unisom, no Gelcaps   Leg Cramps:  Tums  MagGel   Nausea/Vomiting:  Bonine  Dramamine  Emetrol  Ginger extract  Sea bands  Meclizine  Nausea medication to take during pregnancy:  Unisom (doxylamine succinate 25 mg tablets) Take one tablet daily at bedtime. If symptoms are not adequately controlled, the dose can be increased to a maximum recommended dose of two tablets daily (1/2 tablet in the morning, 1/2 tablet mid-afternoon and one at bedtime).  Vitamin B6 100mg  tablets. Take one tablet twice a day (up to 200 mg per day).   Skin Rashes:  Aveeno products  Benadryl cream or 25mg  every 6 hours as needed  Calamine Lotion  1% cortisone cream   Yeast infection:  Gyne-lotrimin 7  Monistat 7    **If taking multiple medications, please check labels to avoid  duplicating the same active ingredients  **take medication as directed on the label  ** Do not exceed 4000 mg of tylenol in 24 hours  **Do not take medications that contain aspirin or ibuprofen            [facility] Pediatric Providers  [place] ([address]) [facility] Manson Passey, MD; Deirdre Priest, MD; Lum Babe, MD; Leveda Anna, MD; McDiarmid, MD; Jerene Bears, MD [address] [place] [place], [place] [address] [phone] Mon-Fri 8:30-12:30, 1:30-5:00  Providers come to see babies during newborn hospitalization Only accepting infants of Mother's who are seen at [facility] or have siblings seen at   [facility] Medicaid - Yes; Tricare - Yes   [facility] Nezperce, MD [address], [place], [place] [address] [phone] Mon, Tue, Thur, Fri 8:30-5:00, Wed 10:00-7:00 (closed 1-2pm daily for lunch) [facility] residents with no insurance.  [place] [employer] only with Medicaid/insurance; Tricare - no  [facility]) - [facility], MD; Manson Passey, MD; Ave Filter, MD; Luna Fuse, MD; Kennedy Bucker, MD; Florestine Avers, MD; Melchor Amour, MD; Yetta Barre,  MD; Konrad Dolores, MD; Kathlene November, MD; Jenne Campus, MD; Wynetta Emery, MD; Duffy Rhody, MD; Gerre Couch, NP [address] [place], [place], [place] [address] [phone] Mon, Tue, Thur, Fri 8:30-5:30, Wed 9:30-5:30, Sat 8:30-12:30 Only accepting infants of first-time parents or siblings of current  patients Hospital discharge coordinator will make follow-up appointment Medicaid - yes; Tricare - yes  [place] ([address]) [facility] Cox, MD; Earlene Plater, MD; Jamesetta Orleans, MD; Alvera Novel, MD; Rana Snare, MD; [facility], MD; [place], MD; Hosie Poisson, MD; Mayford Knife, MD [address] [place], [place] [address] [phone] Mon-Fri 8:30-5:00, closed for lunch 12:30-1:30; Sat-Sun 10:00-1:00 Accepting Newborns with commercial insurance only, must call prior to  delivery to be accepted into  practice.  Medicaid - no, Tricare - yes   [facility] [address] [place], [place] [address] [phone] or [phone] Mon to Fri 8am to 10pm, Sat 8am to 1pm (virtual only on weekends) Only accepts Medicaid Healthy Blue pts  [facility] ([facility]) - Pediatrics at Elige Radon, MD; Sabino Dick, MD; Quitman Livings, MD; Betha Loa, NP; Claretha Cooper, MD; Lelon Perla, MD [address] [place] [place], [place] [address] [phone] Mon-Fri 8:30-5:30 Medicaid - yes, Tricare - yes  [place] [phone]) [facility] Mowers, MD [address], [place], [place] [address] [phone] Iona Hansen, Wed Fri 8:30-5:00, Sat 8:30-12:00, Closed Thursdays Accepting siblings of established patients and first time mom's if you call prenatally Medicaid- yes; Tricare - yes  [facility] Lutricia Feil, [place]; Tracie Harrier, MD; Rusty Aus; Scifres, PA; Wynelle Link, MD; Azucena Cecil, MD;  [address], [place], [place] [address] [phone] Mon-Fri 8:30-5:00, closed for lunch 1-2 Only accepting newborns of established patients Medicaid- no; Tricare - yes  [facility] [phone]) [facility] Morene Crocker, MD; [address], [place], [place] [address] [phone] Mon-Fri 8:00-5:00 Medicaid - No; Tricare - Yes  [facility], [place]; [place], [place] [address] [place], [place] [address] [phone] Mon-Fri 8:00-5:00 Medicaid - No, Tricare - Yes  [facility] Cardell Peach, MD; Nash Dimmer, MD; [place], [place] [address] [place], [place] [address] [phone]  Mon-Fri 8:00-5:00 Medicaid - No; Tricare - Yes  [facility] [address], [place], [place] [address] [phone] Mon-Fri 8:30-5:00 (lunch 12:00-1:00) Medicaid -Yes; Tricare - Yes  [facility] [place], MD [address] [place],  [place], [place] [address] [phone] Mon-Fri 8:00-5:00 Seeing newborns of current patients only. No new patients Medicaid - No, Tricare - yes  Nature conservation officer at [place] [address], MD [address] [place], [place] [address] [phone] Mon-Fri 8:00-5:00 Medicaid -yes as secondary coverage only; Tricare - yes  [facility] [place], [place]; [place], [place]; Avis Epley, MD; Vonna Kotyk, MD; Clance Boll, MD; [place], [place]; [place], NP; Vaughan Basta, MD; [place], MD [address] [place], [place] [address] [phone] Mon-Fri 8:30-5:00, Sat 9:00-11:00 Accepts commercial insurance ONLY. Offers free prenatal information sessions for families. Medicaid - No, Tricare - Call first  [facility], MD; [place], [place]; [place], [place]; [place], [place] [address] [place] [address] [phone] Mon-Fri 7:30-5:30 Medicaid - Yes; Ailene Rud yes  [place] [phone] & [phone])  [facility], MD [address] [place], [place] [address] [phone] Mon-Thur 8:00-6:00, closed for lunch 12-2, closed Fridays Medicaid - yes; Tricare - no  [facility] Dareen Piano, NP; Cyndia Bent, MD; [place], [place]; [place], [place] [address], [place], [place] [address] [phone] Mon-Fri 7:30-4:30 Medicaid - yes, Tricare - yes  [facility]  Juanito Doom, MD; Janene Harvey, NP; Vonita Moss, MD; Donn Pierini, NP [address], [place], [place] [address] [phone] Mon-Fri 8:30-5:00, closed for lunch 1-2, Sat 8:30-12:00 - sick visits only Providers come to see  babies at [facility] Only accepting newborns of siblings and first time parents ONLY if who have met with office prior to delivery Medicaid -Yes; Tricare - yes  [facility] - [place], [place]; Spero Geralds, NP; Earlene Plater, MD; Lucretia Roers, MD:  [address], [place], [place] [address] [phone] Mon- Fri 8:00-5:00, Sat 9:00-12:00 - sick  visits only Accepting siblings of established patients and first time mom/baby Medicaid - Yes; Tricare - yes Patients must have vaccinations (baby vaccines)  [place] [phone] & [phone])  Adult nurse HealthCare at [facility] [address] [place], [place] [address] [phone] Mon-Fri 8:00-5:00 Medicaid - no; Tricare - yes  [facility] [place], MD; [place], [place]; [place], [place] [address], [place], [place] [address] [phone] Mon-Fri 8:00-5:00 Medicaid- yes; Tricare - yes  [facility] - Ardeen Jourdain, MD; Yetta Barre, NP; [place], [place] [address] [place], [place], [place] [address] [phone] Mon-Fri 8:00-5:00 Medicaid - Yes; Tricare - yes  [address] [place] [phone])  [facility] [place], [place]; [employer], [place]; Eddie Candle, MD; Normand Sloop, MD; [place], NP; [place], DO; [place], [place]; Constance Goltz, MD; Ruthann Cancer, MD; Vear Clock, MD; [place], [place]; [place], [place]; [place], [place] [facility] [facility] [address], [place], [place] [address] [phone] Mon-Fri 8:30-5:00, Sat 9:00-12:00 - sick only Please register online [email or web address] then schedule online or call office Medicaid-Yes; Tricare -yes  [facility] - [place]  [place], MD; Romualdo Bolk, MD; [place], MD; [place], NP; [place], [place]; Antonietta Barcelona, MD; Mayford Knife, NP; Shelva Majestic, MD [address], [place], [place] [address] [phone] Mon-Fri 8:00-5:30, Sat&Sun by appointment (phones open at 8:30) Medicaid - Yes; Tricare - yes  [place] [phone] & [phone]) [facility] Mariel Aloe; [place], MD; Roger Shelter, MD; Arvilla Market, NP; [place], DO [address], [place], [place] [address] [phone] M-F 8:00 - 5:15, Sat/Sun 9-12 sick visits only Medicaid - No; Tricare - yes  [facility]  [place], PA-C; [place], PA-C; [place], DO; [place], PA-C; [place], PA-C; Roselyn Bering, MD [address] [place], [place] [address] [phone] Mon-Thur 8:00-7:00, Fri 8:00-5:00 Accepting Medicaid for 13 and under only   [facility] Family Medicine at [place] [facility] [place] [place], [place]; List, FNP; Berneda Rose, MD; [place] Redo, PA-C; Lavonia Drafts, MD; Kellie Simmering, FNP; Genevie Cheshire, FNP; Evaristo Bury, MD; Berneda Rose, MD [phone] [address], [place], [place] [address] [phone] Mon-Fri 8:30-5:30 Medicaid - Yes; Tricare - yes  [facility] - [address]  [place], [place]; Whitney Post, MD; Hennie Duos, MD; Wynne Dust, MD; [place], NP [address], [place], [place] [address] [phone] Mon-Thur 8:00-5:30, Fri 8:00-5:00, Sat 9:00-12:00 Medicaid - yes, Tricare - yes  [place] [phone])  [facility], [place]; Lenise Arena, MD; [place], [place] [address], [place], [place] [address] [phone] Mon-Fri 8:00-5:00, closed for lunch 12-1 Medicaid - No; Tricare - yes  Nature conservation officer at [facility], MD [address] [place], [place] [address] [phone] Mon-Fri 8:00-5:00 Medicaid - No; Tricare - yes  [facility], MD; Tami Ribas, MD; Mariam Dollar, MD; Yetta Barre, MD [address] [facility] [address], [place], [place] [address] [phone] Mon-Fri 8:00-5:00 Medicaid- Yes; Tricare - yes  [place] [phone])  Adult nurse HealthCare at [facility], [place]; [place], MD [address] [place], [place], [place] [address] [phone] Mon-Fri 8:00-5:00 Medicaid - No; Tricare - yes  [facility] - [facility] - CPNP [address], [place], [place] [address] [phone] Mon-Weds 8:00-6:00, Thurs-Fri 8:00-5:00, Sat 9:00-12:00 Medicaid - yes; Tricare - yes   [facility] Katharina Caper, MD; [place], [place] [address] [place], [place] [address] [phone] Mon-Fri 8:00-5:00 Medicaid - yes; Tricare - yes  [facility]  Pediatric Providers  [facility] [address], [place], [place] [address] [phone] Sheral Flow: 8am -8pm, Tues, Weds: 8am - 5pm; Fri: 8-1 Medicaid - Yes; Tricare - yes  [facility] [place] [facility] Rachel Bo, MD; Laural Benes, MD; Anner Crete, MD; [place], [place]; [place], [place] [address] [place], [place] [address] [phone] M-F 8:30 - 5:00 Medicaid - Call office; Tricare -yes  [facility] Edson Snowball, MD; Shanon Rosser, MD, Chelsea Primus, MD; Shirlyn Goltz, PNP; Wardell Heath, NP [phone] [address] [place], [place] [address] [phone] M-F 8:30 - 5:00, Sat/Sun 8:30 - 12:30 (sick visits) Medicaid - Call office; Tricare -yes  [facility] Melvyn Neth, MD; Karl Luke, PNP; Princess Bruins, MD; [place], [place]; [place], NP; Cynda Familia [address], [place], [place] [address] [phone] M-F 8:30 - 5:00 Medicaid - Call office; Tricare - yes  [facility] Jesusita Oka, MD; Dierdre Highman, MD; Earnest Conroy, MD; Timothy Lasso, MD; [place], MD [phone] [address], [place], [place] [address] [phone] M-Thur: 8:00 - 5:00; Fri: 8:00 - 4:00 Medicaid - yes; Tricare - yes  [facility] [address] [place], [place] [address] [phone] M-F: 8:30- 5:00, closed for lunch 12:30 - 1:00 Medicaid - yes; Tricare -yes  [facility] [address], [place], [place] [address] [phone] M-F 8:00 - 5:00 Medicaid - yes; Tricare - yes   - [facility] [place], DO; [place], DO; [place], NP [address] [place], [place] [address] [phone] M-F 8:00 - 5:00, Closed 12-1 for lunch Medicaid - Call; Tricare - yes  [facility] Meredith Mody, MD [address] [place], [place] [address] [phone] M-F: 8:00-5:00, Sat: 8:00 - noon Medicaid - call; Tricare -yes  [facility] Pediatric Providers  [facility] [place], [place] [address] [place], [place], [place] [address] [phone] M-W: 8:00-5:00, Thur: 8:00 -  7:00, Fri: 8:00 - noon Medicaid - yes; Tricare - yes  [facility] - Quay Burow, FNP [address] [place], [place] [address] [phone] M-F 8:00 - 5:00, Closed for lunch 12-1 Medicaid - yes; Tricare - yes  [facility] Pediatric Providers  [facility] at [place], [place], Alinda Money, MD, [place], FNP-C [address], [facility], [address], [place], [place] [address] [phone] M-T 8:00-5:00, Wed-Fri 7:00-6:00 Medicaid - Yes; Tricare -yes  [facility] at [facility], DO; [address] [place] [place], [place] [address] [phone] M-F 8:00 - 5:00, closed for lunch 12-1 Medicaid - Yes; Tricare - yes  [facility]  Zachery Dauer, MD; Gladstone Lighter, MD; Collie Siad, MD; Freda [place], MD; Rich Number, MD; Darryl Nestle, MD; Melinda Crutch, MD, Audria Nine, MD; Tawanna Cooler, MD; Steffanie Dunn, MD; Byrd Hesselbach, MD; Lucretia Roers, MD [address], [place], [place] [address] [phone] M-F 8:00-5:00 Medicaid - yes; Tricare - yes  [facility] [place], MD (speaks [place] and Hindi) [address] [place], [place] [address] [phone] M-F: 8:30 - 5:00, closed 12:30 - 1 for lunch Medicaid - Yes; Tricare -yes  [facility] Pediatric Providers  [facility] Shanda Bumps, MD; Chanetta Marshall, MD; Laurell Josephs,  MD [address], [place], [place] [address] [phone] M-Th: 8:00 - 5:30, Fri: 8:00 - 12:00 Medicaid - yes; Tricare - yes  [facility] at [facility], NP; Thora Lance, MD; Orrin Brigham, MD [phone] [address], [place], [place] [address] [phone] M-F: 8:00 - 5:00 Medicaid - yes; Tricare - yes  [facility] [place]gett, NP; Orson Slick, NP; Salley Scarlet, NP; Linton Flemings, MD; Mayford Knife, MD, [place], NP, Emelda Fear, MD; Nida Boatman [address] [place] [address] [place], [place] [address] [phone] M-F: 8:30 - 5:30p Medicaid - yes; Tricare - yes Other locations available as well  [facility], MD; Andrey Campanile, MD; Neville Route, PA-C [address] [place], [place] [address] [phone] M-W: 8:00am - 7:00pm, Thurs: 8:00am - 8:00pm; Fri: 8:00am - 5:00pm, closed daily from 12-1 for lunch Medicaid - yes; Tricare - yes  [facility] Pediatric Providers  [facility] at Levin Erp, MD; Aggie Cosier, FNP; Bland Span, MD; Tristan Schroeder, MD; [place], PNP; Alesia Banda; [place], [place]; Julian Reil, MD;  [address], [place], [place] [address] [phone] Judie Petit - Caleen Essex: 8am - 5pm, Sat 9-noon Medicaid - Yes; Tricare -yes  [facility] at Jaclynn Guarneri, MD; Yetta Barre, FNP; Lilian Kapur, MD; Mariam Dollar, MD [address] [place], [place] [phone] M-F 8:00 - 5:00 Medicaid - call; Tricare - yes  [facility]- Cruz Condon, MD; [place], [place]; Delora Fuel, MD; Dareen Piano, MD; Trudee Grip, MD; Kizzie Ide, MD; Zebedee Iba; Birdena Crandall, MD; Hinton Dyer, MD; [place], MD [address] [place], [place] [address] [phone] M-F 8:00am - 5:00pm; Sat. 9:00 - 11:00 Medicaid - yes; Tricare - yes  [facility] at [facility], MD [address] [place], [place] [address] [phone] M-F 8:00 - 5:00 Medicaid - [place] Medicaid only; Tricare - yes  [facility] - Illene Bolus, MD; Earlene Plater, [place]; Kenyon Ana, MD [address] [place], [place] [address] [phone] M-F 8:00 - 5:00 Medicaid - yes; Tricare - yes  [facility] [address] Pediatrics - Lind Covert, MD; Manson Passey, MD, [facility] [place], MD, [place], MD; [place], MD; Katrinka Blazing, MD; [address] [place], [place] [address] [phone] M-F: 8-5 Medicaid - yes; Tricare - yes  [facility] - Henrietta Hoover, ; [place], MD; [address], [place], [place] [address] [phone] M-F 8-5 Medicaid - yes; Tricare - yes  [address] [place] Darrol Poke, MD; Tami Ribas, MD; [place], MD; [place], DNP;  [place], PNP [address], [place], [place] [address] [phone] M-F 8-5 Medicaid - yes; Tricare - yes  [facility] Internal Medicine and Pediatrics Delories Heinz, MD; Adrienne Mocha; Ala Bent, MD [address], [place], [place] [address] [phone] M-F 7am - 5 pm Medicaid - call; Tricare - yes  [facility] [place], [place]; Fredia Beets, MD; Roxan Hockey, MD [address] [place], [place] [address] [phone] M-F 8-5 Medicaid - yes; Tricare - yes  [facility] Kae Heller, MD; Sheliah Hatch, MD; Mayford Knife, FNP; Shon Baton, FNP; Tyron Russell, FNP; Ishmael Holter; [facility] - FNP [address] [place], [place] [address] [phone] M-F 8-5 Medicaid- yes; Tricare - yes  [facility] - Betsy Coder, [place] and Chalmers Guest, MD; Terrial Rhodes, MD; Hulda Humphrey, MD; Roseanne Reno, MD; [place], [place]; Ala Dach, MD; Fredia Beets, MD; Dimple Casey, MD [address] [place], [place] [address] [phone] M-F: 8-5, Sat: 9-4, Sun 9-12 Medicaid - yes; Tricare - yes  [facility] [place], PNP; [place], PNP [address] [place],  [place] [address] [phone] M-F 8 - 5, closed 12-1 for lunch Medicaid - yes; Tricare - yes  [facility] Kathyrn Lass, MD; Hal Neer, MD; Dimple Casey, MD; [place], DO [address], [place], [place] [address] [phone] M-F 8- 5:30 Medicaid - yes; Tricare - yes  [facility] - Biagio Quint, MD; Rosalia Hammers, MD; Gwenith Daily, MD [address] [place], [place] [address] [phone] Judie Petit: Nicholas Lose; Tues-Fri: 8-5; Sat: 9-12 Medicaid - yes; Tricare - yes  [facility] - Bobbye Morton, MD; Daphane Shepherd, MD; Chestine Spore, MD; Haskell Riling, MD; Kate Sable, MD [address] [place], [place] [address] [phone] Judie PetitMarland Kitchen Nicholas LoseFrancee Nodal: 8-5; Sat: 8:30-12:30 Medicaid - yes;  Tricare - yes  [facility] Heckle [facility] - Beckey Rutter, MD; [place], [place] [address] [place], [place] [address] [phone] Mon-Fri: 8-5 Medicaid - yes; Tricare - yes  [facility]s - [place], CPNP; [place], [place]; Dimple Casey, MD; Alisa Graff, MD; Cephus Shelling, MD; [address], [place], [place] [address] [phone] M-F: 8-5, closed 1-2 for lunch Medicaid - yes; Tricare - yes  [facility]elle [facility] - [facility] [place], [place]; [employer], [place]; Katrinka Blazing, MD; [place], CPNP; [place], [place]; [place], MD; Earlene Plater, MD [address], [place], [place] [address] [phone] M-Thurs: Nicholas Lose; Fri: 8-6; Sat: 9-12; Sun 2-4 Medicaid - yes; Tricare - yes  [facility] Georgeanna Lea, MD; Evette Cristal, MD; Shea Stakes, FNP; Earney Mallet, DO; [address] [place], [place] [address] [phone] M-F: 8-5 Medicaid - yes; Tricare - yes  [facility] Pediatric Providers  [facility] Family Medicine -Collene Mares, MD; [place], NP [address] [place], [place] [address] [phone] M - Fri: 8am - 5pm, closed for lunch 12-1 Medicaid - Yes; Tricare - yes  [facility] and Pediatrics Elinor Parkinson, MD; Victory Dakin, MD; [place], DO; Vinocur, MD;Hall, PA; Clent [place], [place]; Orvan Falconer, NP [phone] [address], [place], [place] [address] [phone] M-F 8:00 - 5:00, Sat 8:00 - 11:30 Medicaid - yes; Tricare - yes  [facility] Welton Flakes, MD; [place], MD, [address] MD, [place], MD, [place], MD; Echo, NP; [place], [place];  [address], [place], [place] [address] [phone] M-F 8:10am - 5:00pm Medicaid - yes; Tricare - yes  [facility] Eustaquio Boyden, MD; [place], NP [address] [place], [place] [address] [phone] M-F 8:00 - 5:00 Medicaid - [place] Medicaid only; Tricare - yes  [facility] - [facility] [address] [place], MD; [place], NP [address], [place], [place] [address] [phone] M-F 8:00 - 5:00; Closed for lunch 12 - 1:00 Medicaid - yes; Tricare - yes  [facility] Belva Crome, MD; Jonita Albee, FNP [address] [place], [place] [address] [phone] Mon 9-5; Tues/Wed 10-5; Thurs 8:30-5; Fri: 8-12:30 Medicaid - yes; Tricare - yes  [facility] Pediatric Providers  [facility]  Valeria, MD; [place], [place] [address] [place], [place] [address] [phone] phone [phone] fax M-F 7:15 - 4:30 Medicaid - yes; Tricare - yes  [facility] Karilyn Cota, MD; [place], DO [address], [place], [place] [address] [phone] M-Fri: 8:30 - 5:00, closed for lunch everyday noon - 1pm Medicaid - Yes; Tricare - yes  [facility] Burdine, MD; Reuel Boom, MD; Dimas Aguas, MD; Neita Carp, MD; [place], [place]; Bonnita Nasuti, [place]; Davis, [place]; Meggett,  PA; Svens[place], [place] [address] [place], [place] [address] [phone] M-Thurs: 7:30am - 7:00pm; Friday 7:30am - 4pm; Sat: 8:00 - 1:00 Medicaid - Yes; Tricare - yes  [place] Morton, MD; Conni Elliot, MD; Carroll Kinds, MD; Lyn[place], DO [address], [place], [place] [address] [phone] M-Thur: 8:00 - 5:00, Fri: 8:00 - Noon Medicaid - yes; Tricare - yes No  [facility]  [place] [facility] [place], MD; Nadine Counts, DO; [place], NP; Daphine Deutscher, NP; Lequita Halt, NP; Ellamae Sia, NP; Reginia Forts, NP; Darlyn Read, MD; [place], [place] [address], [place], [place] [address] [phone] M-F 8:00 - 5:00 Medicaid - yes; Tricare - yes  [facility], FNP-C; [place], FNP-C [address] [place], [place] [address] [phone] M, W, R 8:00-5:00, Tues: 8:00am - 7:00pm; Fri 8:00 - noon Medicaid - Yes; Tricare - yes  [facility], MD [address] [place], [place] [address] [phone]  M-Thurs 8:30-5:30, Fri: 8:30-12:30pm Medicaid - Yes; Tricare - N

## 2023-08-01 ENCOUNTER — Encounter: Payer: Self-pay | Admitting: Family Medicine

## 2023-08-01 ENCOUNTER — Ambulatory Visit: Payer: MEDICAID | Admitting: Family Medicine

## 2023-08-01 ENCOUNTER — Other Ambulatory Visit: Payer: Self-pay

## 2023-08-01 VITALS — BP 125/65 | HR 73 | Wt 228.4 lb

## 2023-08-01 DIAGNOSIS — O219 Vomiting of pregnancy, unspecified: Secondary | ICD-10-CM

## 2023-08-01 DIAGNOSIS — O0991 Supervision of high risk pregnancy, unspecified, first trimester: Secondary | ICD-10-CM | POA: Diagnosis not present

## 2023-08-01 DIAGNOSIS — O0990 Supervision of high risk pregnancy, unspecified, unspecified trimester: Secondary | ICD-10-CM

## 2023-08-01 DIAGNOSIS — Z3A11 11 weeks gestation of pregnancy: Secondary | ICD-10-CM

## 2023-08-01 DIAGNOSIS — Z1332 Encounter for screening for maternal depression: Secondary | ICD-10-CM

## 2023-08-01 LAB — HEMOGLOBIN A1C
Est. average glucose Bld gHb Est-mCnc: 105 mg/dL
Hgb A1c MFr Bld: 5.3 % (ref 4.8–5.6)

## 2023-08-01 MED ORDER — PREPLUS 27-1 MG PO TABS: 1.0000 | ORAL_TABLET | Freq: Every day | ORAL | 13 refills | Status: AC

## 2023-08-01 MED ORDER — ONDANSETRON 4 MG PO TBDP: 4.0000 mg | ORAL_TABLET | Freq: Four times a day (QID) | ORAL | 3 refills | Status: AC | PRN

## 2023-08-01 MED ORDER — ASPIRIN 81 MG PO CHEW
81.0000 mg | CHEWABLE_TABLET | Freq: Every day | ORAL | 3 refills | Status: AC
Start: 2023-08-01 — End: ?

## 2023-08-01 MED ORDER — SCOPOLAMINE 1 MG/3DAYS TD PT72: 1.0000 | MEDICATED_PATCH | TRANSDERMAL | 0 refills | Status: DC

## 2023-08-01 NOTE — Progress Notes (Signed)
   PRENATAL VISIT NOTE  Subjective:  Kristen Dean is a 23 y.o. [phone] at [redacted]w[redacted]d being seen today for transferring prenatal care from [facility] due to obesity.  She is currently monitored for the following issues for this low-risk pregnancy and has Supervision of high risk pregnancy, antepartum on their problem list.  Patient reports nausea and vomiting.  Contractions: Not present. Vag. Bleeding: None.  Movement: Absent. Denies leaking of fluid.   The following portions of the patient's history were reviewed and updated as appropriate: allergies, current medications, past family history, past medical history, past social history, past surgical history and problem list.   Objective:    Vitals:   08/01/23 0937  BP: 125/65  Pulse: 73  Weight: 228 lb 6.4 oz (103.6 kg)    Fetal Status:  Fetal Heart Rate (bpm): 164   Movement: Absent    General: Alert, oriented and cooperative. Patient is in no acute distress.  Skin: Skin is warm and dry. No rash noted.   Cardiovascular: Normal heart rate noted  Respiratory: Normal respiratory effort, no problems with respiration noted  Abdomen: Soft, gravid, appropriate for gestational age.  Pain/Pressure: Absent     Pelvic: Cervical exam deferred        Extremities: Normal range of motion.  Edema: None  Mental Status: Normal mood and affect. Normal behavior. Normal judgment and thought content.   Assessment and Plan:  Pregnancy: [id number] at [redacted]w[redacted]d 1. [redacted] weeks gestation of pregnancy (Primary)   2. Supervision of high risk pregnancy, antepartum New OB labs reviewed Check A1C, Genetics today Add ASA - Hemoglobin A1c - PANORAMA PRENATAL TEST - HORIZON Basic Panel - Prenatal Vit-Fe Fumarate-FA (PREPLUS) 27-1 MG TABS; Take 1 tablet by mouth daily.  Dispense: 30 tablet; Refill: 13 - aspirin  81 MG chewable tablet; Chew 1 tablet (81 mg total) by mouth daily.  Dispense: 90 tablet; Refill: 3  3. Nausea and vomiting in pregnancy prior to [redacted] weeks  gestation Trial of scopolamine  Zofran  prn Eat frequently  Push fluids Protein at hs - ondansetron  (ZOFRAN -ODT) 4 MG disintegrating tablet; Take 1 tablet (4 mg total) by mouth every 6 (six) hours as needed for nausea.  Dispense: 42 tablet; Refill: 3 - scopolamine  (TRANSDERM-SCOP) 1 MG/3DAYS; Place 1 patch (1.5 mg total) onto the skin every 3 (three) days.  Dispense: 10 patch; Refill: 0  General obstetric precautions including but not limited to vaginal bleeding, contractions, leaking of fluid and fetal movement were reviewed in detail with the patient. Please refer to After Visit Summary for other counseling recommendations.   Return in 4 weeks (on 08/29/2023).  Future Appointments  Date Time Provider Department Center  08/28/2023  9:35 AM Claudene Petty , CNM [facility]  09/26/2023  8:00 AM [facility] PROVIDER 1 [facility]  09/26/2023  8:30 AM [facility] US3 [facility]    Glenys GORMAN Birk, MD

## 2023-08-02 ENCOUNTER — Ambulatory Visit: Payer: Self-pay | Admitting: Family Medicine

## 2023-08-02 ENCOUNTER — Encounter: Payer: Self-pay | Admitting: *Deleted

## 2023-08-02 DIAGNOSIS — O0990 Supervision of high risk pregnancy, unspecified, unspecified trimester: Secondary | ICD-10-CM

## 2023-08-08 ENCOUNTER — Encounter: Payer: Self-pay | Admitting: Family Medicine

## 2023-08-08 LAB — PANORAMA PRENATAL TEST FULL PANEL:PANORAMA TEST PLUS 5 ADDITIONAL MICRODELETIONS: FETAL FRACTION: 11.4

## 2023-08-11 LAB — HORIZON CUSTOM: REPORT SUMMARY: NEGATIVE

## 2023-08-28 ENCOUNTER — Ambulatory Visit (INDEPENDENT_AMBULATORY_CARE_PROVIDER_SITE_OTHER): Payer: MEDICAID | Admitting: Advanced Practice Midwife

## 2023-08-28 ENCOUNTER — Other Ambulatory Visit (HOSPITAL_COMMUNITY)
Admission: RE | Admit: 2023-08-28 | Discharge: 2023-08-28 | Disposition: A | Discharge status: Home / Self Care | Payer: MEDICAID | Source: Ambulatory Visit | Attending: Advanced Practice Midwife | Admitting: Advanced Practice Midwife

## 2023-08-28 ENCOUNTER — Other Ambulatory Visit: Payer: Self-pay

## 2023-08-28 VITALS — BP 102/68 | HR 67 | Wt 228.4 lb

## 2023-08-28 DIAGNOSIS — O0992 Supervision of high risk pregnancy, unspecified, second trimester: Secondary | ICD-10-CM | POA: Diagnosis not present

## 2023-08-28 DIAGNOSIS — Z3A15 15 weeks gestation of pregnancy: Secondary | ICD-10-CM | POA: Diagnosis not present

## 2023-08-28 DIAGNOSIS — N898 Other specified noninflammatory disorders of vagina: Secondary | ICD-10-CM | POA: Insufficient documentation

## 2023-08-28 DIAGNOSIS — O0990 Supervision of high risk pregnancy, unspecified, unspecified trimester: Secondary | ICD-10-CM

## 2023-08-28 DIAGNOSIS — K5900 Constipation, unspecified: Secondary | ICD-10-CM

## 2023-08-28 DIAGNOSIS — R350 Frequency of micturition: Secondary | ICD-10-CM

## 2023-08-28 NOTE — Patient Instructions (Signed)
  CenteringPregnancy is a model of prenatal care that started 30 years ago and is used in about 600 practices around the [place]. You meet with a group of 8-12 women due around the same time as you. In Centering you will have individual time with the provider and meet as a group. There's much more time for discussion and learning. You will actually have much more time with your provider in Centering than in traditional prenatal care.? You will come directly into the Centering room and will not wait in the lobby so there is no wasted time. You will have 2-hour visits every 4 weeks then every 2 weeks. You will know your Centering prenatal appointments in advance. In your last month of pregnancy, you may also come in for some individual visits. Additional appointments can be scheduled if you need more care. Studies have shown that CenteringPregnancy improves birth outcomes. We have seen especially big improvements in fewer Black women delivering babies who are too small or born too early. Visit the website CenteringHealthcare for more information. Let your provider or clinic staff know if you want to sign up or email [email or web address] for more information.   CenteringPregnancy Video

## 2023-08-28 NOTE — Progress Notes (Addendum)
   PRENATAL VISIT NOTE  Subjective:  Kristen Dean is a 23 y.o. [id number] at [redacted]w[redacted]d being seen today for ongoing prenatal care.  She is currently monitored for the following issues for this low-risk pregnancy and has Supervision of high risk pregnancy, antepartum on their problem list.  Patient reports constipation. Reports not having a bowel movement for 4 days.  Was  finally able to have a bowel movement yesterday. Stool was soft.  Would like to hold off on medication at this time. Also, reports vaginal discharge with some intermittent irritation. Denies odor.  Contractions: Not present. Vag. Bleeding: None.  Movement: Absent. Denies leaking of fluid.   The following portions of the patient's history were reviewed and updated as appropriate: allergies, current medications, past family history, past medical history, past social history, past surgical history and problem list.   Objective:    Vitals:   08/28/23 1040  BP: 102/68  Pulse: 67  Weight: 228 lb 6.4 oz (103.6 kg)    Fetal Status:  Fetal Heart Rate (bpm): 153   Movement: Absent    General: Alert, oriented and cooperative. Patient is in no acute distress.  Skin: Skin is warm and dry. No rash noted.   Cardiovascular: Normal heart rate noted  Respiratory: Normal respiratory effort, no problems with respiration noted  Abdomen: Soft, gravid, appropriate for gestational age.  Pain/Pressure: Absent     Pelvic: Cervical exam deferred        Extremities: Normal range of motion.  Edema: None  Mental Status: Normal mood and affect. Normal behavior. Normal judgment and thought content.   Assessment and Plan:  Pregnancy: [id number] at [redacted]w[redacted]d 1. Supervision of high risk pregnancy, antepartum (Primary) Doing well. BP and FHR normal today. Defer AFP to next visit Anatomy U/S scheduled for 9/24  2. [redacted] weeks gestation of pregnancy Defer AFP to next visit  3. Constipation, unspecified constipation type Hold on medication at this time.  Educated patient on diet and hydration. Can take otc colace and miralax if needed.  4. Vaginal discharge Self swab collected today  5. Urinary frequency Urinalysis completed today.   Preterm labor symptoms and general obstetric precautions including but not limited to vaginal bleeding, contractions, leaking of fluid and fetal movement were reviewed in detail with the patient. Please refer to After Visit Summary for other counseling recommendations.   No follow-ups on file.  Future Appointments  Date Time Provider Department Center  09/25/2023  3:55 PM [name] [name] DELENA EDDY [facility]  09/26/2023  8:00 AM [facility] PROVIDER 1 [facility]  09/26/2023  8:30 AM [facility] US3 [facility]  10/23/2023 10:15 AM Davis, Devon E, PA-C [facility]  11/20/2023  8:20 AM [facility] LAB [facility]  11/20/2023  9:15 AM W[facility]-GENERAL 2 [facility]  12/11/2023  9:15 AM [facility]-GENERAL 2 [facility]  12/25/2023  4:15 PM WMC-GENERAL 2 [facility]  01/08/2024  9:35 AM WMC-GENERAL 2 [facility]  01/22/2024 10:55 AM WMC-GENERAL 2 [facility]  01/29/2024 11:15 AM WMC-GENERAL 2 [facility]  02/05/2024 11:15 AM WMC-GENERAL 2 [facility]  02/12/2024 11:15 AM WMC-GENERAL 2 [facility]  02/19/2024 11:15 AM WMC-GENERAL 2 [facility]    Magdeline Prange JINNY Freund, NP Student

## 2023-08-29 LAB — POCT URINALYSIS DIP (DEVICE)
Bilirubin Urine: NEGATIVE
Glucose, UA: NEGATIVE mg/dL
Hgb urine dipstick: NEGATIVE
Ketones, ur: NEGATIVE mg/dL
Leukocytes,Ua: NEGATIVE
Nitrite: NEGATIVE
Protein, ur: NEGATIVE mg/dL
Specific Gravity, Urine: 1.02 (ref 1.005–1.030)
Urobilinogen, UA: 0.2 mg/dL (ref 0.0–1.0)
pH: 7.5 (ref 5.0–8.0)

## 2023-08-30 LAB — CERVICOVAGINAL ANCILLARY ONLY
Bacterial Vaginitis (gardnerella): NEGATIVE
Candida Glabrata: NEGATIVE
Candida Vaginitis: NEGATIVE
Comment: NEGATIVE
Comment: NEGATIVE
Comment: NEGATIVE

## 2023-09-06 ENCOUNTER — Other Ambulatory Visit: Payer: Self-pay

## 2023-09-06 ENCOUNTER — Inpatient Hospital Stay (HOSPITAL_COMMUNITY)
Admission: AD | Admit: 2023-09-06 | Discharge: 2023-09-06 | Disposition: A | Discharge status: Home / Self Care | Payer: MEDICAID | Attending: Obstetrics & Gynecology | Admitting: Obstetrics & Gynecology

## 2023-09-06 DIAGNOSIS — W500XXA Accidental hit or strike by another person, initial encounter: Secondary | ICD-10-CM | POA: Diagnosis not present

## 2023-09-06 DIAGNOSIS — Z3A16 16 weeks gestation of pregnancy: Secondary | ICD-10-CM

## 2023-09-06 DIAGNOSIS — Z0379 Encounter for other suspected maternal and fetal conditions ruled out: Secondary | ICD-10-CM | POA: Diagnosis present

## 2023-09-06 DIAGNOSIS — O9A22 Injury, poisoning and certain other consequences of external causes complicating childbirth: Secondary | ICD-10-CM | POA: Diagnosis not present

## 2023-09-06 DIAGNOSIS — Z711 Person with feared health complaint in whom no diagnosis is made: Secondary | ICD-10-CM

## 2023-09-06 MED ORDER — FAMOTIDINE 20 MG PO TABS: 20.0000 mg | ORAL_TABLET | Freq: Two times a day (BID) | ORAL | 6 refills | Status: DC

## 2023-09-06 NOTE — Discharge Instructions (Signed)
 Everything is very reassuring today that your baby is doing well!
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
## 2023-09-09 ENCOUNTER — Ambulatory Visit: Payer: Self-pay | Admitting: Advanced Practice Midwife

## 2023-09-18 ENCOUNTER — Inpatient Hospital Stay (HOSPITAL_COMMUNITY)
Admission: AD | Admit: 2023-09-18 | Discharge: 2023-09-18 | Disposition: A | Discharge status: Home / Self Care | Payer: MEDICAID | Attending: Family Medicine | Admitting: Family Medicine

## 2023-09-18 ENCOUNTER — Encounter (HOSPITAL_COMMUNITY): Payer: Self-pay | Admitting: Obstetrics & Gynecology

## 2023-09-18 ENCOUNTER — Other Ambulatory Visit: Payer: Self-pay

## 2023-09-18 DIAGNOSIS — Z3A18 18 weeks gestation of pregnancy: Secondary | ICD-10-CM | POA: Insufficient documentation

## 2023-09-18 DIAGNOSIS — O26892 Other specified pregnancy related conditions, second trimester: Secondary | ICD-10-CM | POA: Diagnosis not present

## 2023-09-18 DIAGNOSIS — Z3A16 16 weeks gestation of pregnancy: Secondary | ICD-10-CM | POA: Diagnosis not present

## 2023-09-18 DIAGNOSIS — N898 Other specified noninflammatory disorders of vagina: Secondary | ICD-10-CM | POA: Diagnosis not present

## 2023-09-18 DIAGNOSIS — O9A212 Injury, poisoning and certain other consequences of external causes complicating pregnancy, second trimester: Secondary | ICD-10-CM

## 2023-09-18 DIAGNOSIS — W500XXA Accidental hit or strike by another person, initial encounter: Secondary | ICD-10-CM | POA: Diagnosis not present

## 2023-09-18 DIAGNOSIS — K5900 Constipation, unspecified: Secondary | ICD-10-CM | POA: Diagnosis present

## 2023-09-18 DIAGNOSIS — Z0371 Encounter for suspected problem with amniotic cavity and membrane ruled out: Secondary | ICD-10-CM | POA: Diagnosis present

## 2023-09-18 DIAGNOSIS — J00 Acute nasopharyngitis [common cold]: Secondary | ICD-10-CM | POA: Diagnosis present

## 2023-09-18 LAB — URINALYSIS, ROUTINE W REFLEX MICROSCOPIC
Bilirubin Urine: NEGATIVE
Glucose, UA: NEGATIVE mg/dL
Hgb urine dipstick: NEGATIVE
Ketones, ur: NEGATIVE mg/dL
Leukocytes,Ua: NEGATIVE
Nitrite: NEGATIVE
Protein, ur: NEGATIVE mg/dL
Specific Gravity, Urine: 1.025 (ref 1.005–1.030)
pH: 7 (ref 5.0–8.0)

## 2023-09-18 LAB — WET PREP, GENITAL
Clue Cells Wet Prep HPF POC: NONE SEEN
Sperm: NONE SEEN
Trich, Wet Prep: NONE SEEN
WBC, Wet Prep HPF POC: <10 (ref <10)
Yeast Wet Prep HPF POC: NONE SEEN

## 2023-09-18 LAB — POCT FERN TEST: POCT Fern Test: NEGATIVE
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
## 2023-09-18 NOTE — MAU Note (Signed)
 Kristen Dean is a 23 y.o. at [redacted]w[redacted]d here in MAU reporting: she's been overly wet down there since Saturday.  Reports she's been leaking clear fluid with mucous.  Denies VB.  Unsure if she began feeling movement yesterday.  LMP: 04/07/2023 Onset of complaint: Saturday Pain score: 3 Vitals:   09/18/23 1049  BP: 123/69  Pulse: 87  Resp: 18  Temp: 98.3 F (36.8 C)  SpO2: 99%     FHT: 154 bpm  Lab orders placed from triage: None

## 2023-09-24 NOTE — Progress Notes (Unsigned)
   PRENATAL VISIT NOTE  Subjective:  Kristen Dean is a 23 y.o. H4E7977 at [redacted]w[redacted]d being seen today for ongoing prenatal care.  She is currently monitored for the following issues for this {Blank single:19197::high-risk,low-risk} pregnancy and has Supervision of high risk pregnancy, antepartum on their problem list.  Patient reports {sx:14538}.   .  .   . Denies leaking of fluid.   The following portions of the patient's history were reviewed and updated as appropriate: allergies, current medications, past family history, past medical history, past social history, past surgical history and problem list.   Objective:    There were no vitals filed for this visit.  Fetal Status:           General: Alert, oriented and cooperative. Patient is in no acute distress.  Skin: Skin is warm and dry. No rash noted.   Cardiovascular: Normal heart rate noted  Respiratory: Normal respiratory effort, no problems with respiration noted  Abdomen: Soft, gravid, appropriate for gestational age.        Pelvic: {Blank single:19197::Cervical exam performed in the presence of a chaperone,Cervical exam deferred}        Extremities: Normal range of motion.     Mental Status: Normal mood and affect. Normal behavior. Normal judgment and thought content.   Assessment and Plan:  Pregnancy: [id number] at [redacted]w[redacted]d 1. Supervision of high risk pregnancy, antepartum (Primary) ***  2. [redacted] weeks gestation of pregnancy ***  {Blank single:19197::Term,Preterm} labor symptoms and general obstetric precautions including but not limited to vaginal bleeding, contractions, leaking of fluid and fetal movement were reviewed in detail with the patient. Please refer to After Visit Summary for other counseling recommendations.   No follow-ups on file.  Future Appointments  Date Time Provider Department Center  09/25/2023  3:55 PM [name] [name] DELENA EDDY [facility]  09/26/2023  8:00 AM [facility] PROVIDER 1 [facility]   09/26/2023  8:30 AM [facility] US3 [facility]  10/23/2023 10:15 AM Davis, Devon E, PA-C [facility]  11/20/2023  8:20 AM [facility] LAB [facility]  11/20/2023  9:15 AM [name] [name] DELENA, CNM [facility]  12/11/2023  9:15 AM [facility] 2 [facility]  12/25/2023  4:15 PM [facility] 2 [facility]  01/08/2024  9:35 AM [facility] 2 [facility]  01/22/2024 10:55 AM [facility] 2 [facility]  01/29/2024 11:15 AM [facility] 2 [facility]  02/05/2024 11:15 AM [facility] 2 [facility]  02/12/2024 11:15 AM [facility] 2 [facility]  02/19/2024 11:15 AM [facility] 2 [facility]    [name] DELENA [name], CNM

## 2023-09-25 ENCOUNTER — Other Ambulatory Visit: Payer: Self-pay

## 2023-09-25 ENCOUNTER — Ambulatory Visit (INDEPENDENT_AMBULATORY_CARE_PROVIDER_SITE_OTHER): Payer: MEDICAID | Admitting: Certified Nurse Midwife

## 2023-09-25 VITALS — BP 105/75 | HR 73 | Wt 230.0 lb

## 2023-09-25 DIAGNOSIS — Z3A19 19 weeks gestation of pregnancy: Secondary | ICD-10-CM

## 2023-09-25 DIAGNOSIS — O0992 Supervision of high risk pregnancy, unspecified, second trimester: Secondary | ICD-10-CM

## 2023-09-25 DIAGNOSIS — O0990 Supervision of high risk pregnancy, unspecified, unspecified trimester: Secondary | ICD-10-CM

## 2023-09-25 NOTE — Patient Instructions (Signed)
 Magnesium glycinate or oxide 1-2 pills nightly at bedtime.

## 2023-09-26 ENCOUNTER — Ambulatory Visit: Payer: MEDICAID | Attending: Family Medicine

## 2023-09-26 ENCOUNTER — Other Ambulatory Visit: Payer: Self-pay | Admitting: *Deleted

## 2023-09-26 ENCOUNTER — Ambulatory Visit (HOSPITAL_BASED_OUTPATIENT_CLINIC_OR_DEPARTMENT_OTHER): Payer: MEDICAID | Admitting: Obstetrics and Gynecology

## 2023-09-26 VITALS — BP 109/61 | HR 81

## 2023-09-26 DIAGNOSIS — E669 Obesity, unspecified: Secondary | ICD-10-CM

## 2023-09-26 DIAGNOSIS — O0990 Supervision of high risk pregnancy, unspecified, unspecified trimester: Secondary | ICD-10-CM | POA: Insufficient documentation

## 2023-09-26 DIAGNOSIS — Z3A19 19 weeks gestation of pregnancy: Secondary | ICD-10-CM

## 2023-09-26 DIAGNOSIS — O99212 Obesity complicating pregnancy, second trimester: Secondary | ICD-10-CM | POA: Diagnosis not present

## 2023-09-26 DIAGNOSIS — Z362 Encounter for other antenatal screening follow-up: Secondary | ICD-10-CM

## 2023-09-26 NOTE — Progress Notes (Signed)
 Maternal-Fetal Medicine Consultation  Name: Kristen Dean  MRN: [id number]  GA: [id number] [redacted]w[redacted]d   Patient is here for fetal anatomy scan. On cell-free fetal DNA screening, the risks of aneuploidies are not increased. MSAFP screening showed low risk for open-neural tube defects. Obstetric history significant for 2 term vaginal deliveries.  Her children weighed 7 pounds and 8 pounds at birth.  She has a new partner now.  Ultrasound We performed fetal anatomy scan. No makers of aneuploidies or fetal structural defects are seen. Fetal biometry is consistent with her previously-established dates.  Estimated fetal weight is at the 12th percentile.  Amniotic fluid is normal and good fetal activity is seen. Patient understands the limitations of ultrasound in detecting fetal anomalies.  I reassured the patient of the findings.  I counseled the patient on estimated fetal weight at the 12 percentile and that it is more likely to be constitutional and fetal growth restriction.  She has a different partner and that could influence the fetal weight. She does not have other risk factors including hypertension.  Recommendations - An appointment was made for her to return in 6 weeks for fetal growth assessment.     Consultation including face-to-face (more than 50%) counseling 15 minutes.

## 2023-10-10 ENCOUNTER — Ambulatory Visit: Payer: Self-pay

## 2023-10-12 ENCOUNTER — Ambulatory Visit
Admission: RE | Admit: 2023-10-12 | Discharge: 2023-10-12 | Disposition: A | Discharge status: Home / Self Care | Payer: MEDICAID | Source: Ambulatory Visit | Attending: Physician Assistant | Admitting: Physician Assistant

## 2023-10-12 VITALS — BP 117/68 | HR 78 | Temp 98.8°F | Resp 16

## 2023-10-12 DIAGNOSIS — J0100 Acute maxillary sinusitis, unspecified: Secondary | ICD-10-CM

## 2023-10-12 DIAGNOSIS — N898 Other specified noninflammatory disorders of vagina: Secondary | ICD-10-CM

## 2023-10-12 DIAGNOSIS — R350 Frequency of micturition: Secondary | ICD-10-CM | POA: Diagnosis present

## 2023-10-12 LAB — POCT URINE DIPSTICK
Bilirubin, UA: NEGATIVE
Blood, UA: NEGATIVE
Glucose, UA: NEGATIVE mg/dL
Leukocytes, UA: NEGATIVE
Nitrite, UA: NEGATIVE
POC PROTEIN,UA: 30 — AB
Spec Grav, UA: 1.02 (ref 1.010–1.025)
Urobilinogen, UA: 0.2 U/dL
pH, UA: 6.5 (ref 5.0–8.0)

## 2023-10-12 MED ORDER — FLUTICASONE PROPIONATE 50 MCG/ACT NA SUSP: 1.0000 | Freq: Every day | NASAL | 0 refills | Status: DC

## 2023-10-12 MED ORDER — AMOXICILLIN 875 MG PO TABS: 875.0000 mg | ORAL_TABLET | Freq: Two times a day (BID) | ORAL | 0 refills | Status: DC
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
## 2023-10-12 NOTE — ED Triage Notes (Signed)
 Pt reports vaginal irritation and white milk discharge x 1 week.   Pt reports nasal congestion and headache since 09/14/2023. Pt has not taken any meds as she is [redacted] weeks pregnant.

## 2023-10-12 NOTE — Discharge Instructions (Signed)
 I am concerned that you have a sinus infection given your ongoing congestion and headache.  Start amoxicillin  twice daily for 7 days.  Use fluticasone  nasal spray for the next 7 days.  You can also use nasal saline and sinus rinses.  Ensure that you rest and drink plenty of fluid.  Follow-up with your primary care if your symptoms or not improving.  If anything worsens and you have severe cough, high fever, nausea/vomiting interfering with oral intake you need to be seen immediately.  We will contact you if your swab is positive we will need to start treatment.  Wear loosefitting cotton underwear and use hypotonic soaps and detergents.  If you have any pelvic pain, abnormal bleeding, significant change in your discharge you should be reevaluated.

## 2023-10-14 LAB — URINE CULTURE

## 2023-10-15 ENCOUNTER — Ambulatory Visit (HOSPITAL_COMMUNITY): Payer: Self-pay

## 2023-10-15 LAB — CERVICOVAGINAL ANCILLARY ONLY
Bacterial Vaginitis (gardnerella): NEGATIVE
Candida Glabrata: NEGATIVE
Candida Vaginitis: NEGATIVE
Chlamydia: NEGATIVE
Comment: NEGATIVE
Comment: NEGATIVE
Comment: NEGATIVE
Comment: NEGATIVE
Comment: NEGATIVE
Comment: NORMAL
Neisseria Gonorrhea: NEGATIVE
Trichomonas: NEGATIVE

## 2023-10-19 ENCOUNTER — Encounter: Payer: Self-pay | Admitting: Physician Assistant

## 2023-10-23 ENCOUNTER — Ambulatory Visit: Payer: MEDICAID | Admitting: Physician Assistant

## 2023-10-23 ENCOUNTER — Other Ambulatory Visit: Payer: Self-pay

## 2023-10-23 VITALS — BP 124/73 | HR 80 | Wt 234.8 lb

## 2023-10-23 DIAGNOSIS — R3915 Urgency of urination: Secondary | ICD-10-CM | POA: Diagnosis not present

## 2023-10-23 DIAGNOSIS — R42 Dizziness and giddiness: Secondary | ICD-10-CM

## 2023-10-23 DIAGNOSIS — O0992 Supervision of high risk pregnancy, unspecified, second trimester: Secondary | ICD-10-CM

## 2023-10-23 DIAGNOSIS — Z3A23 23 weeks gestation of pregnancy: Secondary | ICD-10-CM | POA: Diagnosis not present

## 2023-10-23 DIAGNOSIS — O0990 Supervision of high risk pregnancy, unspecified, unspecified trimester: Secondary | ICD-10-CM

## 2023-10-23 DIAGNOSIS — J019 Acute sinusitis, unspecified: Secondary | ICD-10-CM | POA: Diagnosis not present

## 2023-10-23 DIAGNOSIS — R232 Flushing: Secondary | ICD-10-CM

## 2023-10-23 LAB — POCT URINALYSIS DIP (DEVICE)
Bilirubin Urine: NEGATIVE
Glucose, UA: NEGATIVE mg/dL
Hgb urine dipstick: NEGATIVE
Ketones, ur: NEGATIVE mg/dL
Leukocytes,Ua: NEGATIVE
Nitrite: NEGATIVE
Protein, ur: NEGATIVE mg/dL
Specific Gravity, Urine: 1.02 (ref 1.005–1.030)
Urobilinogen, UA: 0.2 mg/dL (ref 0.0–1.0)
pH: 7 (ref 5.0–8.0)

## 2023-10-23 NOTE — Progress Notes (Signed)
 Pt concerned of Protein in Urine when she went to Urgent Care.

## 2023-10-23 NOTE — Progress Notes (Unsigned)
   PRENATAL VISIT NOTE  Subjective:  Kristen Dean is a 23 y.o. [id number] at [redacted]w[redacted]d being seen today for ongoing prenatal care.  She is currently monitored for the following issues for this low-risk pregnancy and has Supervision of high risk pregnancy, antepartum on their problem list.  Patient went to urgent care to sinus infection and was treated with antibiotics. She reported urinary urgency and culture suggested recollection; she requests repeat today. She is concerned about feeling more hot intermittently in pregnancy. Request to check thyroid. Patient intermittently feels dizzy/lightheaded without dyspnea, palpitations, cp, LE edema  Contractions: None.  Vag. Bleeding: None.  Movement: Present. Denies leaking of fluid.   The following portions of the patient's history were reviewed and updated as appropriate: allergies, current medications, past family history, past medical history, past social history, past surgical history and problem list.   Objective:    Vitals:   10/23/23 1018  BP: 124/73  Pulse: 80  Weight: 234 lb 12.8 oz (106.5 kg)    Fetal Status:  Fetal Heart Rate (bpm): 154 Fundal Height: 22 cm Movement: Present    General: Alert, oriented and cooperative. Patient is in no acute distress.  Skin: Skin is warm and dry. No rash noted.   Cardiovascular: Normal heart rate noted  Respiratory: Normal respiratory effort, no problems with respiration noted  Abdomen: Soft, gravid, appropriate for gestational age.  Pain/Pressure: Absent     Pelvic: Cervical exam deferred        Extremities: Normal range of motion.     Mental Status: Normal mood and affect. Normal behavior. Normal judgment and thought content.   Assessment and Plan:  Pregnancy: [id number] at [redacted]w[redacted]d  1. Supervision of high risk pregnancy, antepartum (Primary) Patient doing well, feeling regular fetal movement BP, FHR, FH appropriate   2. [redacted] weeks gestation of pregnancy Anticipatory guidance about next  visits/weeks of pregnancy given.   3. Acute sinusitis, recurrence not specified, unspecified location Dx 10/12/23 Amoxicillin  875 mg BID x 7 days  Fluticasone  nasal spray Asymptomatic  4. Urinary urgency 10/10 culture with multiple species present, suggests recollection. Repeat today  5. Dizziness No red flag or cardiac symptoms. Last hgb 13.6. Reminded patient re: adequate nutrition and hydration, compression socks when standing for long periods, slowly changing positions from standing to sitting and vice versa. Left lateral decubitus when resting. If she develops cp, dyspnea, palpitations, she should seek emergency care.   6. Hot flashes - TSH Rfx on Abnormal to Free T4  Preterm labor symptoms and general obstetric precautions including but not limited to vaginal bleeding, contractions, leaking of fluid and fetal movement were reviewed in detail with the patient.  Please refer to After Visit Summary for other counseling recommendations.   No follow-ups on file.  Future Appointments  Date Time Provider Department Center  11/08/2023  1:15 PM [facility] PROVIDER 1 [facility]  11/08/2023  1:30 PM [facility] US3 [facility]  11/20/2023  8:20 AM [facility] LAB [facility]  11/20/2023  9:15 AM [name] [name] DELENA EDDY [facility]  12/11/2023  8:55 AM Cleatus Moccasin, MD [facility]  12/25/2023  4:15 PM Wallace Joesph DELENA, PA [facility]  01/08/2024  9:35 AM [facility] 2 [facility]  01/22/2024 10:55 AM [facility] 2 [facility]  01/29/2024 11:15 AM [facility] 2 [facility]  02/05/2024 11:15 AM [facility]GENERAL 2 [facility]  02/12/2024 11:15 AM [facility] 2 [facility]  02/19/2024 11:15 AM [facility]    Jorene FORBES Moats, PA-C

## 2023-10-23 NOTE — Patient Instructions (Addendum)
 Excedrin tension Get electrolytes  Compression socks

## 2023-10-24 LAB — TSH RFX ON ABNORMAL TO FREE T4: TSH: 1.63 u[IU]/mL (ref 0.450–4.500)

## 2023-10-25 ENCOUNTER — Ambulatory Visit: Payer: Self-pay | Admitting: Physician Assistant

## 2023-10-25 LAB — AFP, SERUM, OPEN SPINA BIFIDA
AFP MoM: 1.33
AFP Value: 93.6 ng/mL
Gest. Age on Collection Date: 23 wk
Maternal Age At EDD: 25.4 a
OSBR Risk 1 IN: 8798
Test Results:: NEGATIVE
Weight: 234 [lb_av]

## 2023-10-26 DIAGNOSIS — O99210 Obesity complicating pregnancy, unspecified trimester: Secondary | ICD-10-CM | POA: Insufficient documentation

## 2023-11-02 ENCOUNTER — Inpatient Hospital Stay (HOSPITAL_COMMUNITY)
Admission: AD | Admit: 2023-11-02 | Discharge: 2023-11-02 | Disposition: A | Discharge status: Home / Self Care | Payer: MEDICAID | Attending: Obstetrics and Gynecology | Admitting: Obstetrics and Gynecology

## 2023-11-02 ENCOUNTER — Encounter (HOSPITAL_COMMUNITY): Payer: Self-pay | Admitting: Obstetrics and Gynecology

## 2023-11-02 ENCOUNTER — Other Ambulatory Visit: Payer: Self-pay

## 2023-11-02 DIAGNOSIS — O26892 Other specified pregnancy related conditions, second trimester: Secondary | ICD-10-CM | POA: Diagnosis not present

## 2023-11-02 DIAGNOSIS — N898 Other specified noninflammatory disorders of vagina: Secondary | ICD-10-CM | POA: Diagnosis not present

## 2023-11-02 DIAGNOSIS — O0992 Supervision of high risk pregnancy, unspecified, second trimester: Secondary | ICD-10-CM | POA: Diagnosis not present

## 2023-11-02 DIAGNOSIS — O368120 Decreased fetal movements, second trimester, not applicable or unspecified: Secondary | ICD-10-CM | POA: Insufficient documentation

## 2023-11-02 DIAGNOSIS — Z3492 Encounter for supervision of normal pregnancy, unspecified, second trimester: Secondary | ICD-10-CM

## 2023-11-02 DIAGNOSIS — Z3A25 25 weeks gestation of pregnancy: Secondary | ICD-10-CM

## 2023-11-02 DIAGNOSIS — O0990 Supervision of high risk pregnancy, unspecified, unspecified trimester: Secondary | ICD-10-CM

## 2023-11-02 LAB — WET PREP, GENITAL
Clue Cells Wet Prep HPF POC: NONE SEEN
Sperm: NONE SEEN
Trich, Wet Prep: NONE SEEN
WBC, Wet Prep HPF POC: <10 (ref <10)
Yeast Wet Prep HPF POC: NONE SEEN

## 2023-11-02 LAB — URINALYSIS, ROUTINE W REFLEX MICROSCOPIC
Bilirubin Urine: NEGATIVE
Glucose, UA: NEGATIVE mg/dL
Hgb urine dipstick: NEGATIVE
Ketones, ur: NEGATIVE mg/dL
Leukocytes,Ua: NEGATIVE
Nitrite: NEGATIVE
Protein, ur: NEGATIVE mg/dL
Specific Gravity, Urine: 1.019 (ref 1.005–1.030)
pH: 6 (ref 5.0–8.0)

## 2023-11-02 MED ORDER — METRONIDAZOLE 500 MG PO TABS: 500.0000 mg | ORAL_TABLET | Freq: Two times a day (BID) | ORAL | 0 refills | Status: DC

## 2023-11-02 NOTE — MAU Note (Signed)
 Kristen Dean is a 23 y.o. at [redacted]w[redacted]d here in MAU reporting: no FM for 3 hours and white/yellow thick discharge, no c.o VB, LOF, contractions, HA, chest pain, or visual disturbances.   LMP:  Onset of complaint: 3 hours ago Pain score: 5/10 Vitals:   11/02/23 1158 11/02/23 1200  BP: 123/61   Pulse: 84   Resp: 16   SpO2:  98%     FHT: 150  Lab orders placed from triage: ua swabs
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
## 2023-11-05 LAB — GC/CHLAMYDIA PROBE AMP (~~LOC~~) NOT AT ARMC
Chlamydia: NEGATIVE
Comment: NEGATIVE
Comment: NORMAL
Neisseria Gonorrhea: NEGATIVE

## 2023-11-08 ENCOUNTER — Ambulatory Visit: Payer: MEDICAID

## 2023-11-08 ENCOUNTER — Other Ambulatory Visit: Payer: Self-pay | Admitting: *Deleted

## 2023-11-08 ENCOUNTER — Ambulatory Visit: Payer: MEDICAID | Attending: Obstetrics and Gynecology | Admitting: Obstetrics

## 2023-11-08 ENCOUNTER — Other Ambulatory Visit: Payer: Self-pay | Admitting: Obstetrics and Gynecology

## 2023-11-08 VITALS — BP 121/68

## 2023-11-08 DIAGNOSIS — Z362 Encounter for other antenatal screening follow-up: Secondary | ICD-10-CM

## 2023-11-08 DIAGNOSIS — O99210 Obesity complicating pregnancy, unspecified trimester: Secondary | ICD-10-CM

## 2023-11-08 DIAGNOSIS — Z3A25 25 weeks gestation of pregnancy: Secondary | ICD-10-CM | POA: Insufficient documentation

## 2023-11-08 DIAGNOSIS — E669 Obesity, unspecified: Secondary | ICD-10-CM | POA: Diagnosis not present

## 2023-11-08 DIAGNOSIS — O365920 Maternal care for other known or suspected poor fetal growth, second trimester, not applicable or unspecified: Secondary | ICD-10-CM | POA: Diagnosis not present

## 2023-11-08 DIAGNOSIS — O99212 Obesity complicating pregnancy, second trimester: Secondary | ICD-10-CM | POA: Insufficient documentation

## 2023-11-08 DIAGNOSIS — O99213 Obesity complicating pregnancy, third trimester: Secondary | ICD-10-CM

## 2023-11-08 DIAGNOSIS — O0990 Supervision of high risk pregnancy, unspecified, unspecified trimester: Secondary | ICD-10-CM

## 2023-11-08 DIAGNOSIS — O365990 Maternal care for other known or suspected poor fetal growth, unspecified trimester, not applicable or unspecified: Secondary | ICD-10-CM

## 2023-11-08 NOTE — Progress Notes (Signed)
 MFM Consult Note  Kristen Dean is currently at 25 weeks and 1 day.  She has been followed as a small for gestational age fetus was noted during her prior ultrasound exam.    She denies any problems since her last exam and reports feeling fetal movements throughout the day.    Sonographic findings Single intrauterine pregnancy at 25w 6d.  Fetal cardiac activity:  Observed and appears normal. Presentation: Cephalic. Fetal biometry shows the estimated fetal weight of 1 pound 10 ounces which measures at the 7th percentile, indicating IUGR. Amniotic fluid volume: Within normal limits. MVP: 6.85 cm. Placenta: Posterior.  Doppler studies of the umbilical arteries showed an elevated S/D ratio of 4.08 .  There were no signs of absent or reversed end-diastolic flow.    IUGR  The patient was reassured that IUGR is a common finding.  Most cases of IUGR resulted in the delivery of a healthy infant at or close to term.  The patient reports that this is a new FOB.  There is a family history of having smaller size children in his family.  Due to IUGR, we will continue to follow her with frequent ultrasounds for fetal assessment and umbilical artery Doppler studies.  She will return in 2 weeks for an NST and umbilical artery Doppler study.    The patient understands that should IUGR be noted later in her pregnancy, delivery will be recommended at between 37 to 38 weeks.    She stated that all of her questions were answered today.  A total of 20 minutes was spent counseling and coordinating the care for this patient.  Greater than 50% of the time was spent in direct face-to-face contact.

## 2023-11-12 ENCOUNTER — Inpatient Hospital Stay (HOSPITAL_COMMUNITY)
Admission: AD | Admit: 2023-11-12 | Discharge: 2023-11-12 | Disposition: A | Discharge status: Home / Self Care | Payer: MEDICAID | Attending: Obstetrics & Gynecology | Admitting: Obstetrics & Gynecology

## 2023-11-12 ENCOUNTER — Encounter (HOSPITAL_COMMUNITY): Payer: Self-pay | Admitting: Obstetrics & Gynecology

## 2023-11-12 DIAGNOSIS — B9689 Other specified bacterial agents as the cause of diseases classified elsewhere: Secondary | ICD-10-CM | POA: Diagnosis not present

## 2023-11-12 DIAGNOSIS — O23592 Infection of other part of genital tract in pregnancy, second trimester: Secondary | ICD-10-CM

## 2023-11-12 DIAGNOSIS — Z3A26 26 weeks gestation of pregnancy: Secondary | ICD-10-CM

## 2023-11-12 DIAGNOSIS — O26852 Spotting complicating pregnancy, second trimester: Secondary | ICD-10-CM | POA: Diagnosis present

## 2023-11-12 LAB — URINALYSIS, ROUTINE W REFLEX MICROSCOPIC
Bilirubin Urine: NEGATIVE
Glucose, UA: NEGATIVE mg/dL
Ketones, ur: NEGATIVE mg/dL
Leukocytes,Ua: NEGATIVE
Nitrite: NEGATIVE
Protein, ur: NEGATIVE mg/dL
Specific Gravity, Urine: 1.02 (ref 1.005–1.030)
pH: 6 (ref 5.0–8.0)

## 2023-11-12 LAB — WET PREP, GENITAL
Sperm: NONE SEEN
Trich, Wet Prep: NONE SEEN
WBC, Wet Prep HPF POC: >=10 — AB (ref <10)
Yeast Wet Prep HPF POC: NONE SEEN

## 2023-11-12 MED ORDER — METRONIDAZOLE 0.75 % VA GEL: 1.0000 | Freq: Every day | VAGINAL | 1 refills | Status: DC
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
## 2023-11-12 NOTE — MAU Note (Signed)
 MAU Triage Note: Kristen Dean is a 23 y.o. at [redacted]w[redacted]d here in MAU reporting: intermittent light pink spotting that started yesterday. She also reports mild cramping in her upper and lower abdomen. She has recently been treated for BV (finished last dose on Saturday) and has continued to have clear, white, thick discharge. She is complains of perineal itching/irritation and believes she may be developing a yeast infection. Denies watery LOF. Reports +FM.  Patient complaint: Spotting  Pain Score: 4  Pain Location: Abdomen     Onset of complaint: yesterday LMP: Patient's last menstrual period was 04/07/2023.  Vitals:   11/12/23 2132  BP: 127/71  Pulse: 86  Resp: 18  Temp: 97.9 F (36.6 C)  SpO2: 100%    FHT:  Fetal Heart Rate Mode: External Baseline Rate (A): 155 bpm Lab orders placed from triage: UA

## 2023-11-13 ENCOUNTER — Encounter: Payer: Self-pay | Admitting: Physician Assistant

## 2023-11-13 LAB — GC/CHLAMYDIA PROBE AMP (~~LOC~~) NOT AT ARMC
Chlamydia: NEGATIVE
Comment: NEGATIVE
Comment: NORMAL
Neisseria Gonorrhea: NEGATIVE

## 2023-11-16 ENCOUNTER — Other Ambulatory Visit: Payer: Self-pay

## 2023-11-16 DIAGNOSIS — Z3A27 27 weeks gestation of pregnancy: Secondary | ICD-10-CM

## 2023-11-19 NOTE — Progress Notes (Unsigned)
   PRENATAL VISIT NOTE  Subjective:  Kristen Dean is a 23 y.o. [phone] at [redacted]w[redacted]d being seen today for ongoing prenatal care.  She is currently monitored for the following issues for this {Blank single:19197::high-risk,low-risk} pregnancy and has Supervision of high risk pregnancy, antepartum and Obesity affecting pregnancy, antepartum on their problem list.  Patient reports {sx:14538}.   .  .   . Denies leaking of fluid.   The following portions of the patient's history were reviewed and updated as appropriate: allergies, current medications, past family history, past medical history, past social history, past surgical history and problem list.   Objective:   There were no vitals filed for this visit.  Fetal Status:           General: Alert, oriented and cooperative. Patient is in no acute distress.  Skin: Skin is warm and dry. No rash noted.   Cardiovascular: Normal heart rate noted  Respiratory: Normal respiratory effort, no problems with respiration noted  Abdomen: Soft, gravid, appropriate for gestational age.        Pelvic: {Blank single:19197::Cervical exam performed in the presence of a chaperone,Cervical exam deferred}        Extremities: Normal range of motion.     Mental Status: Normal mood and affect. Normal behavior. Normal judgment and thought content.      08/01/2023    9:38 AM 06/19/2023   10:04 AM 06/14/2023    1:56 PM  Depression screen PHQ 2/9  Decreased Interest 0 0 0  Down, Depressed, Hopeless 0 0 0  PHQ - 2 Score 0 0 0  Altered sleeping 1 1 1   Tired, decreased energy 1 1 1   Change in appetite 2 0 0  Feeling bad or failure about yourself  0 0 0  Trouble concentrating 0 1 1  Moving slowly or fidgety/restless 0 0 0  Suicidal thoughts 0 0 0  PHQ-9 Score 4  3  3       Data saved with a previous flowsheet row definition        08/01/2023    9:38 AM  GAD 7 : Generalized Anxiety Score  Nervous, Anxious, on Edge 0  Control/stop worrying 0  Worry  too much - different things 0  Trouble relaxing 2  Restless 0  Easily annoyed or irritable 0  Afraid - awful might happen 0  Total GAD 7 Score 2    Assessment and Plan:  Pregnancy: [id number] at [redacted]w[redacted]d 1. Supervision of high risk pregnancy, antepartum (Primary) ***  2. [redacted] weeks gestation of pregnancy ***  {Blank single:19197::Term,Preterm} labor symptoms and general obstetric precautions including but not limited to vaginal bleeding, contractions, leaking of fluid and fetal movement were reviewed in detail with the patient. Please refer to After Visit Summary for other counseling recommendations.   No follow-ups on file.  Future Appointments  Date Time Provider Department Center  11/20/2023  8:20 AM [facility]  11/20/2023  9:15 AM [name] [name] DELENA EDDY [facility]  12/11/2023  8:55 AM Cleatus Moccasin, MD [facility]  12/25/2023  4:15 PM Wallace Joesph DELENA, PA [facility]  01/08/2024  9:35 AM Littie Olam DELENA, NP [facility]  01/22/2024 10:55 AM [name] [name] DELENA, CNM [facility]  01/29/2024 11:15 AM [name] [name] DELENA, CNM [facility]  02/05/2024 11:15 AM Nicholaus Burnard HERO, MD [facility]  02/12/2024 11:15 AM [facility] 2 [facility]  02/19/2024 11:15 AM [facility] 2 [facility]    [name] DELENA [name], CNM

## 2023-11-20 ENCOUNTER — Other Ambulatory Visit: Payer: MEDICAID

## 2023-11-20 ENCOUNTER — Ambulatory Visit (INDEPENDENT_AMBULATORY_CARE_PROVIDER_SITE_OTHER): Payer: MEDICAID | Admitting: Certified Nurse Midwife

## 2023-11-20 ENCOUNTER — Other Ambulatory Visit: Payer: Self-pay

## 2023-11-20 VITALS — BP 111/58 | HR 87 | Wt 238.0 lb

## 2023-11-20 DIAGNOSIS — Z3A27 27 weeks gestation of pregnancy: Secondary | ICD-10-CM

## 2023-11-20 DIAGNOSIS — O0992 Supervision of high risk pregnancy, unspecified, second trimester: Secondary | ICD-10-CM | POA: Diagnosis not present

## 2023-11-20 DIAGNOSIS — O0990 Supervision of high risk pregnancy, unspecified, unspecified trimester: Secondary | ICD-10-CM

## 2023-11-20 DIAGNOSIS — N898 Other specified noninflammatory disorders of vagina: Secondary | ICD-10-CM

## 2023-11-20 LAB — POCT URINALYSIS DIP (DEVICE)
Bilirubin Urine: NEGATIVE
Glucose, UA: NEGATIVE mg/dL
Hgb urine dipstick: NEGATIVE
Ketones, ur: NEGATIVE mg/dL
Leukocytes,Ua: NEGATIVE
Nitrite: NEGATIVE
Protein, ur: NEGATIVE mg/dL
Specific Gravity, Urine: 1.02 (ref 1.005–1.030)
Urobilinogen, UA: 0.2 mg/dL (ref 0.0–1.0)
pH: 6.5 (ref 5.0–8.0)

## 2023-11-20 MED ORDER — FLUCONAZOLE 150 MG PO TABS: 150.0000 mg | ORAL_TABLET | Freq: Once | ORAL | 0 refills | Status: AC

## 2023-11-20 MED ORDER — TERCONAZOLE 0.4 % VA CREA: 1.0000 | TOPICAL_CREAM | Freq: Every day | VAGINAL | 0 refills | Status: AC

## 2023-11-21 ENCOUNTER — Ambulatory Visit: Payer: Self-pay | Admitting: Certified Nurse Midwife

## 2023-11-21 ENCOUNTER — Other Ambulatory Visit: Payer: Self-pay | Admitting: *Deleted

## 2023-11-21 DIAGNOSIS — O24419 Gestational diabetes mellitus in pregnancy, unspecified control: Secondary | ICD-10-CM

## 2023-11-21 LAB — HIV ANTIBODY (ROUTINE TESTING W REFLEX): HIV Screen 4th Generation wRfx: NONREACTIVE

## 2023-11-21 LAB — CBC
Hematocrit: 35.3 % (ref 34.0–46.6)
Hemoglobin: 11.6 g/dL (ref 11.1–15.9)
MCH: 30.8 pg (ref 26.6–33.0)
MCHC: 32.9 g/dL (ref 31.5–35.7)
MCV: 94 fL (ref 79–97)
Platelets: 274 x10E3/uL (ref 150–450)
RBC: 3.77 x10E6/uL (ref 3.77–5.28)
RDW: 13.5 % (ref 11.7–15.4)
WBC: 10.3 x10E3/uL (ref 3.4–10.8)

## 2023-11-21 LAB — GLUCOSE TOLERANCE, 2 HOURS W/ 1HR
Glucose, 1 hour: 194 mg/dL — ABNORMAL HIGH (ref 70–179)
Glucose, 2 hour: 104 mg/dL (ref 70–152)
Glucose, Fasting: 85 mg/dL (ref 70–91)

## 2023-11-21 LAB — RPR: RPR Ser Ql: NONREACTIVE

## 2023-11-21 MED ORDER — ACCU-CHEK GUIDE W/DEVICE KIT: 1.0000 | PACK | Freq: Four times a day (QID) | 0 refills | Status: AC

## 2023-11-21 MED ORDER — GLUCOSE BLOOD VI STRP
ORAL_STRIP | 12 refills | Status: AC
Start: 2023-11-21 — End: ?

## 2023-11-21 MED ORDER — ACCU-CHEK SOFTCLIX LANCETS MISC: 12 refills | Status: AC

## 2023-11-21 NOTE — Progress Notes (Signed)
 Patient with abnormal GTT, diagnostic for gestational diabetes. Please order diabetes coordinator visit and supplies.  Will send MyChart message to patient.   [name] Rote, MSN, CNM, RNC-OB Certified Nurse Midwife, [facility] 11/21/2023 10:52 AM

## 2023-11-23 ENCOUNTER — Ambulatory Visit: Payer: MEDICAID | Attending: Obstetrics and Gynecology

## 2023-11-23 ENCOUNTER — Ambulatory Visit (HOSPITAL_BASED_OUTPATIENT_CLINIC_OR_DEPARTMENT_OTHER): Payer: MEDICAID

## 2023-11-23 ENCOUNTER — Other Ambulatory Visit: Payer: Self-pay | Admitting: *Deleted

## 2023-11-23 ENCOUNTER — Ambulatory Visit (HOSPITAL_BASED_OUTPATIENT_CLINIC_OR_DEPARTMENT_OTHER): Payer: MEDICAID | Admitting: Obstetrics and Gynecology

## 2023-11-23 VITALS — BP 112/59 | HR 79

## 2023-11-23 DIAGNOSIS — O99210 Obesity complicating pregnancy, unspecified trimester: Secondary | ICD-10-CM | POA: Diagnosis present

## 2023-11-23 DIAGNOSIS — Z3A28 28 weeks gestation of pregnancy: Secondary | ICD-10-CM | POA: Insufficient documentation

## 2023-11-23 DIAGNOSIS — O99213 Obesity complicating pregnancy, third trimester: Secondary | ICD-10-CM | POA: Diagnosis not present

## 2023-11-23 DIAGNOSIS — O24410 Gestational diabetes mellitus in pregnancy, diet controlled: Secondary | ICD-10-CM | POA: Diagnosis present

## 2023-11-23 DIAGNOSIS — O0990 Supervision of high risk pregnancy, unspecified, unspecified trimester: Secondary | ICD-10-CM | POA: Diagnosis present

## 2023-11-23 DIAGNOSIS — O365990 Maternal care for other known or suspected poor fetal growth, unspecified trimester, not applicable or unspecified: Secondary | ICD-10-CM | POA: Diagnosis present

## 2023-11-23 DIAGNOSIS — O365930 Maternal care for other known or suspected poor fetal growth, third trimester, not applicable or unspecified: Secondary | ICD-10-CM | POA: Diagnosis present

## 2023-11-23 DIAGNOSIS — E669 Obesity, unspecified: Secondary | ICD-10-CM | POA: Diagnosis not present

## 2023-11-23 NOTE — Progress Notes (Signed)
 Maternal-Fetal Medicine Consultation  Name: Kristen Dean  MRN: [id number]  GA: [id number] [redacted]w[redacted]d   Fetal growth restriction.  On ultrasound performed 2 weeks ago, the estimated fetal weight was at the 7th percentile. Patient has a new diagnosis of gestational diabetes.  Ultrasound Elevated ultrasound study was performed.  Normal amniotic fluid.  Umbilical artery Doppler showed normal forward diastolic flow.  NST is reactive.  Gestational diabetes I explained the diagnosis of gestational diabetes.  I emphasized the importance of good blood glucose control to prevent adverse fetal or neonatal outcomes.  I discussed normal range of blood glucose values. I encouraged her to check her blood glucose regularly. Patient reports she just started checking her blood glucose levels and has an appointment with our diabetic educator. Possible complications of gestational diabetes include fetal macrosomia, shoulder dystocia and birth injuries, stillbirth (in poorly controlled diabetes) and neonatal respiratory syndrome and other complications. Since fetal growth restriction complicates her pregnancy, macrosomia seems unlikely.  In about 85% of cases, gestational diabetes is well controlled by diet alone.  Exercise reduces the need for insulin.  Medical treatment includes oral hypoglycemics or insulin.  Timing of delivery: In well-controlled diabetes on diet, patient can be delivered at 4- or 40-weeks' gestation. Vaginal delivery can be safely attempted. Type 2 diabetes develops in up to 50% of women with GDM. I recommend postpartum screening with 75-g glucose load at 6 to 12 weeks after delivery.  Fetal growth restriction If fetal growth restriction is suspected, most fetuses are constitutionally small.  Her previous children weighed 7 pounds and 8 pounds at birth.  Patient is 5 feet 8 inches tall.  I counseled the patient that placental insufficiency is the most common cause of fetal growth restriction.   Infection is unlikely as the patient did not have fever or rashes.  Early onset fetal growth restriction is associated with increased risk of chromosomal anomalies and genetic conditions.  I explained that only amniocentesis will give a definitive result on the fetal karyotype or some genetic conditions. Patient opted not to have amniocentesis.  Recommendations -Fetal growth assessment next week and weekly antenatal testing till delivery.   Consultation including face-to-face (more than 50%) counseling 30 minutes.

## 2023-11-23 NOTE — Procedures (Signed)
 Kristen Dean [birth year] [redacted]w[redacted]d  Fetus A Non-Stress Test Interpretation for 11/23/23  Indication: IUGR  Fetal Heart Rate A Mode: External Baseline Rate (A): 140 bpm Variability: Moderate Accelerations: 10 x 10 Decelerations: None Multiple birth?: No  Uterine Activity Mode: Palpation Contraction Frequency (min): none noted Resting Tone Palpated: Relaxed  Interpretation (Fetal Testing) Nonstress Test Interpretation: Reactive Comments: Reviewed with Dr. Arna

## 2023-11-23 NOTE — Progress Notes (Unsigned)
 Patient was seen on 11/14/2023 for Gestational Diabetes self-management class at the [facility]. The following learning objectives were met by the patient during this group class of six. Class start: 0905 Class end: 1050  States the definition of Gestational Diabetes States why dietary management is important in controlling blood glucose Describes the effects each nutrient has on blood glucose levels Demonstrates ability to create a balanced meal plan Demonstrates carbohydrate counting  States when to check blood glucose levels Demonstrates proper blood glucose monitoring techniques States the effect of stress and exercise on blood glucose levels States the importance of limiting caffeine and abstaining from alcohol and smoking   Patient states a plan to pick up meter prior to visit. Patient is instructed to begin testing pre breakfast and 2 hours after each meal. Blood glucose today in class 88 mg/dL, reported as 2 hour post prandial per Pt  Patient instructed to monitor glucose levels:  QID FBS: 60 - <95 1 hour: <140 2 hour: <120  Patient received handouts:  Nutrition Diabetes and Pregnancy Carbohydrate Counting List Blood glucose log Snack ideas for diabetes during pregnancy Plate Planner  Patient will be seen for follow-up as needed.

## 2023-11-27 ENCOUNTER — Other Ambulatory Visit: Payer: MEDICAID

## 2023-11-28 ENCOUNTER — Encounter: Payer: MEDICAID | Attending: Certified Nurse Midwife | Admitting: Dietician

## 2023-11-28 DIAGNOSIS — O24419 Gestational diabetes mellitus in pregnancy, unspecified control: Secondary | ICD-10-CM | POA: Diagnosis present

## 2023-12-03 ENCOUNTER — Ambulatory Visit: Payer: MEDICAID | Attending: Obstetrics

## 2023-12-03 ENCOUNTER — Ambulatory Visit: Payer: MEDICAID | Admitting: *Deleted

## 2023-12-03 ENCOUNTER — Ambulatory Visit: Payer: MEDICAID | Admitting: Maternal & Fetal Medicine

## 2023-12-03 VITALS — BP 110/67 | HR 102

## 2023-12-03 DIAGNOSIS — O365930 Maternal care for other known or suspected poor fetal growth, third trimester, not applicable or unspecified: Secondary | ICD-10-CM | POA: Insufficient documentation

## 2023-12-03 DIAGNOSIS — O0990 Supervision of high risk pregnancy, unspecified, unspecified trimester: Secondary | ICD-10-CM | POA: Diagnosis present

## 2023-12-03 DIAGNOSIS — O24415 Gestational diabetes mellitus in pregnancy, controlled by oral hypoglycemic drugs: Secondary | ICD-10-CM | POA: Diagnosis present

## 2023-12-03 DIAGNOSIS — O99210 Obesity complicating pregnancy, unspecified trimester: Secondary | ICD-10-CM | POA: Diagnosis present

## 2023-12-03 DIAGNOSIS — E669 Obesity, unspecified: Secondary | ICD-10-CM

## 2023-12-03 DIAGNOSIS — Z3A29 29 weeks gestation of pregnancy: Secondary | ICD-10-CM | POA: Insufficient documentation

## 2023-12-03 DIAGNOSIS — O99213 Obesity complicating pregnancy, third trimester: Secondary | ICD-10-CM | POA: Diagnosis present

## 2023-12-03 DIAGNOSIS — O24410 Gestational diabetes mellitus in pregnancy, diet controlled: Secondary | ICD-10-CM | POA: Insufficient documentation

## 2023-12-03 DIAGNOSIS — O365990 Maternal care for other known or suspected poor fetal growth, unspecified trimester, not applicable or unspecified: Secondary | ICD-10-CM | POA: Insufficient documentation

## 2023-12-03 NOTE — Progress Notes (Signed)
 Patient information  Patient Name: KHIA DIETERICH  Patient MRN:   [id number]  Referring practice: MFM Referring Provider: [facility])  Problem List   Patient Active Problem List   Diagnosis Date Noted   Gestational diabetes, diet controlled 12/03/2023   Obesity affecting pregnancy, antepartum 10/26/2023   Supervision of high risk pregnancy, antepartum 07/24/2023    Maternal Fetal medicine Consult  Elody Recchia is a 23 y.o. [id number] at [redacted]w[redacted]d here for ultrasound and consultation. Aicha Ernandez is doing well today with no acute concerns. Today we focused on the following:   Poor fetal growth: I discussed the estimated fetal weight is at the 10th percentile overall.  The fetus is weighing 2 pounds 11 ounces.  This does not technically qualify for growth restriction since the estimated fetal weight in the [facility] are not less than the 10th percentile..  But we should continue to follow the patient closely until the appropriate pattern of fetal growth has been established and is consistently above the 10th percentile.  The umbilical artery Dopplers were normal with an SD ratio of 3.61 at the 83rd percentile on average.  This indicates that the majority of the placenta is functioning appropriately.  Patient will come back for weekly NSTs for the next 3 weeks with an umbilical artery Doppler in 2 weeks and the growth ultrasound with umbilical artery Doppler in 3 weeks.  I discussed the potential association with poor placental function and stillbirth when the estimated fetal weight is low but that since the Dopplers are normal and the weight is technically above the 9th percentile the recurrent risk is low.  The NST was also reactive.  Gestational diabetes, diet-controlled: Patient reports that her diabetes is well-controlled with diet alone.  If over 25% of her blood sugars are not well-controlled then antidiabetic medication should be started with insulin as the  first-line followed by metformin for patients to cannot/will not tolerate insulin/injections and have been counseled the risks and benefits of metformin in pregnancy.  The patient had time to ask questions that were answered to her satisfaction.  She verbalized understanding and agrees to proceed with the plan below.  Recommendations 1w - NST 2w - NST, Limited US  and UA dopplers 3w - NST, UA doppler and growth US   I spent 20 minutes reviewing the patients chart, including labs and images as well as counseling the patient about her medical conditions. Greater than 50% of the time was spent in direct face-to-face patient counseling.  Delora Smaller  MFM, [place]   12/03/2023  1:27 PM   Review of Systems: A review of systems was performed and was negative except per HPI   Vitals and Physical Exam    12/03/2023   11:37 AM 11/23/2023    7:28 AM 11/20/2023    9:13 AM  Vitals with BMI  Weight   238 lbs  BMI   36.2  Systolic 110 112 888  Diastolic 67 59 58  Pulse 102 79 87    Sitting comfortably on the sonogram table Nonlabored breathing Normal rate and rhythm Abdomen is nontender  Past pregnancies OB History  Gravida Para Term Preterm AB Living  5 2 2  2 2   SAB IAB Ectopic Multiple Live Births  2   0 2    # Outcome Date GA Lbr Len/2nd Weight Sex Type Anes PTL Lv  5 Current           4 Term 03/14/20 [redacted]w[redacted]d 03:26 /  02:58 8 lb (3.629 kg) M Vag-Spont EPI  LIV  3 SAB 03/08/19 [redacted]w[redacted]d   U SAB     2 Term 09/27/17 [redacted]w[redacted]d / 02:58 7 lb 13.9 oz (3.569 kg) M Vag-Spont EPI  LIV  1 SAB 2018 [redacted]w[redacted]d           Obstetric Comments  Pregnancy #2 Baby Boy, Vit D Deficiency, Fetal Pyelectasis that resolved     Future Appointments  Date Time Provider Department Center  12/11/2023  8:55 AM Cleatus Moccasin, MD [facility]  12/25/2023  4:15 PM Wallace Joesph LABOR, PA [facility]  01/08/2024  9:35 AM Littie Olam LABOR, NP [facility]  01/22/2024 10:55 AM [name] [name] LABOR, CNM [facility]  01/29/2024  11:15 AM [name] [name] LABOR, CNM [facility]  02/05/2024 11:15 AM Nicholaus Burnard HERO, MD [facility]  02/12/2024 11:15 AM [facility] 2 [facility]  02/19/2024 11:15 AM [facility] 2 [facility]

## 2023-12-03 NOTE — Procedures (Signed)
 Kristen Dean 10-11-2000 [redacted]w[redacted]d  Fetus A Non-Stress Test Interpretation for 12/03/23  NST only   Indication: Diabetes  Fetal Heart Rate A Mode: External Baseline Rate (A): 150 bpm Variability: Moderate Accelerations: 10 x 10 Decelerations: Variable Multiple birth?: No  Uterine Activity Mode: Palpation, Toco Contraction Frequency (min): none Resting Tone Palpated: Relaxed  Interpretation (Fetal Testing) Nonstress Test Interpretation: Reactive Overall Impression: Reassuring for gestational age Comments: Dr. William reviewed tracing

## 2023-12-10 ENCOUNTER — Other Ambulatory Visit: Payer: MEDICAID

## 2023-12-10 ENCOUNTER — Ambulatory Visit: Payer: MEDICAID

## 2023-12-11 ENCOUNTER — Ambulatory Visit: Payer: MEDICAID

## 2023-12-11 ENCOUNTER — Encounter: Payer: MEDICAID | Admitting: Obstetrics and Gynecology

## 2023-12-11 ENCOUNTER — Ambulatory Visit: Payer: MEDICAID | Attending: Obstetrics and Gynecology

## 2023-12-11 DIAGNOSIS — Z3A30 30 weeks gestation of pregnancy: Secondary | ICD-10-CM

## 2023-12-11 DIAGNOSIS — O99213 Obesity complicating pregnancy, third trimester: Secondary | ICD-10-CM

## 2023-12-11 DIAGNOSIS — O365930 Maternal care for other known or suspected poor fetal growth, third trimester, not applicable or unspecified: Secondary | ICD-10-CM

## 2023-12-11 DIAGNOSIS — O24410 Gestational diabetes mellitus in pregnancy, diet controlled: Secondary | ICD-10-CM

## 2023-12-11 NOTE — Procedures (Signed)
 Tammi Boulier Apr 10, 2000 [redacted]w[redacted]d  Fetus A Non-Stress Test Interpretation for 12/11/23  Indication: IUGR and Gestational Diabetes medication controlled - NST only  Fetal Heart Rate A Mode: External Baseline Rate (A): 140 bpm Variability: Moderate Accelerations: 10 x 10 Decelerations: None Multiple birth?: No  Uterine Activity Mode: Palpation, Toco Contraction Frequency (min): none noted Resting Tone Palpated: Relaxed  Interpretation (Fetal Testing) Nonstress Test Interpretation: Reactive Comments: Reviewed with Dr. Ileana

## 2023-12-12 NOTE — Progress Notes (Unsigned)
 PRENATAL VISIT NOTE  Subjective:  Kristen Dean is a 23 y.o. [phone] at [redacted]w[redacted]d being seen today for ongoing prenatal care.  She is currently monitored for the following issues for this high-risk pregnancy and has Supervision of high risk pregnancy, antepartum; Obesity affecting pregnancy, antepartum; Gestational diabetes, diet controlled; and Poor fetal growth affecting management of mother in third trimester on their problem list.  Patient reports {sx:[id number]}.  Contractions: Not present. Vag. Bleeding: None.  Movement: Present. Denies leaking of fluid.   The following portions of the patient's history were reviewed and updated as appropriate: allergies, current medications, past family history, past medical history, past social history, past surgical history and problem list.   Objective:   Vitals:   12/13/23 1431  BP: 119/65  Pulse: 69  Weight: 236 lb 6.4 oz (107.2 kg)    Fetal Status:  Fetal Heart Rate (bpm): 143   Movement: Present    General: Alert, oriented and cooperative. Patient is in no acute distress.  Skin: Skin is warm and dry. No rash noted.   Cardiovascular: Normal heart rate noted  Respiratory: Normal respiratory effort, no problems with respiration noted  Abdomen: Soft, gravid, appropriate for gestational age.  Pain/Pressure: Present     Pelvic: Cervical exam deferred        Extremities: Normal range of motion.  Edema: None  Mental Status: Normal mood and affect. Normal behavior. Normal judgment and thought content.      11/28/2023   10:42 AM 08/01/2023    9:38 AM 06/19/2023   10:04 AM  Depression screen PHQ 2/9  Decreased Interest 0 0 0  Down, Depressed, Hopeless 0 0 0  PHQ - 2 Score 0 0 0  Altered sleeping  1 1  Tired, decreased energy  1 1  Change in appetite  2 0  Feeling bad or failure about yourself   0 0  Trouble concentrating  0 1  Moving slowly or fidgety/restless  0 0  Suicidal thoughts  0 0  PHQ-9 Score  4  3      Data saved with a  previous flowsheet row definition        08/01/2023    9:38 AM  GAD 7 : Generalized Anxiety Score  Nervous, Anxious, on Edge 0  Control/stop worrying 0  Worry too much - different things 0  Trouble relaxing 2  Restless 0  Easily annoyed or irritable 0  Afraid - awful might happen 0  Total GAD 7 Score 2    Assessment and Plan:  Pregnancy: [id number] at [redacted]w[redacted]d 1. Supervision of high risk pregnancy, antepartum (Primary) - Doing well, feeling regular and vigorous fetal movement  2. [redacted] weeks gestation of pregnancy - Routine PNC, anticipatory guidance.   3. Gestational diabetes mellitus (GDM) in second trimester, gestational diabetes method of control unspecified - Reviewed log - majority of recorded values are in range. Encouraged to test 4 times daily to better understand   4. Poor fetal growth affecting management of mother in third trimester, single or unspecified fetus - Followed by MFM. Does not qualify for IUGR due to >10%ile.  - NST weekly, see Dr. Renford excellent note for further plans.   Preterm labor symptoms and general obstetric precautions including but not limited to vaginal bleeding, contractions, leaking of fluid and fetal movement were reviewed in detail with the patient. Please refer to After Visit Summary for other counseling recommendations.   No follow-ups on file.  Future Appointments  Date Time Provider Department  Center  12/18/2023 11:15 AM [facility] PROVIDER 1 [facility]  12/18/2023 11:30 AM [facility] US1 [facility]  12/18/2023 12:30 PM [facility] NST [facility]  12/25/2023  4:15 PM Wallace Joesph LABOR, PA [facility]  12/28/2023  7:15 AM [facility] PROVIDER 1 [facility]  12/28/2023  7:30 AM [facility] US2 [facility]  12/28/2023  8:45 AM [facility] NST [facility]  01/08/2024  9:35 AM Cooleen, Olam LABOR, NP [facility]  01/22/2024 10:55 AM [name] [name] LABOR, CNM [facility]  01/29/2024 11:15 AM [name] [name] LABOR, CNM [facility]  02/05/2024 11:15 AM Nicholaus Burnard HERO, MD [facility]  02/12/2024 11:15 AM [facility]AL 2 [facility]  02/19/2024 11:15 AM [facility]GENERAL 2 [facility]    [name] LABOR [name], CNM

## 2023-12-13 ENCOUNTER — Other Ambulatory Visit: Payer: Self-pay

## 2023-12-13 ENCOUNTER — Other Ambulatory Visit (HOSPITAL_COMMUNITY)
Admission: RE | Admit: 2023-12-13 | Discharge: 2023-12-13 | Disposition: A | Payer: MEDICAID | Source: Ambulatory Visit | Attending: Certified Nurse Midwife | Admitting: Certified Nurse Midwife

## 2023-12-13 ENCOUNTER — Ambulatory Visit: Payer: MEDICAID | Admitting: Certified Nurse Midwife

## 2023-12-13 VITALS — BP 119/65 | HR 69 | Wt 236.4 lb

## 2023-12-13 DIAGNOSIS — Z23 Encounter for immunization: Secondary | ICD-10-CM

## 2023-12-13 DIAGNOSIS — O365930 Maternal care for other known or suspected poor fetal growth, third trimester, not applicable or unspecified: Secondary | ICD-10-CM | POA: Diagnosis not present

## 2023-12-13 DIAGNOSIS — Z3A30 30 weeks gestation of pregnancy: Secondary | ICD-10-CM | POA: Diagnosis not present

## 2023-12-13 DIAGNOSIS — O26893 Other specified pregnancy related conditions, third trimester: Secondary | ICD-10-CM | POA: Diagnosis present

## 2023-12-13 DIAGNOSIS — O0993 Supervision of high risk pregnancy, unspecified, third trimester: Secondary | ICD-10-CM | POA: Diagnosis not present

## 2023-12-13 DIAGNOSIS — O24419 Gestational diabetes mellitus in pregnancy, unspecified control: Secondary | ICD-10-CM | POA: Diagnosis not present

## 2023-12-13 DIAGNOSIS — N898 Other specified noninflammatory disorders of vagina: Secondary | ICD-10-CM

## 2023-12-13 DIAGNOSIS — O0990 Supervision of high risk pregnancy, unspecified, unspecified trimester: Secondary | ICD-10-CM

## 2023-12-17 ENCOUNTER — Encounter: Payer: Self-pay | Admitting: Certified Nurse Midwife

## 2023-12-17 ENCOUNTER — Encounter (HOSPITAL_COMMUNITY): Payer: Self-pay | Admitting: Obstetrics & Gynecology

## 2023-12-17 ENCOUNTER — Inpatient Hospital Stay (HOSPITAL_COMMUNITY)
Admission: AD | Admit: 2023-12-17 | Discharge: 2023-12-17 | Disposition: A | Discharge status: Home / Self Care | Payer: MEDICAID | Attending: Obstetrics & Gynecology | Admitting: Obstetrics & Gynecology

## 2023-12-17 ENCOUNTER — Other Ambulatory Visit: Payer: Self-pay

## 2023-12-17 DIAGNOSIS — B9689 Other specified bacterial agents as the cause of diseases classified elsewhere: Secondary | ICD-10-CM

## 2023-12-17 DIAGNOSIS — Z0371 Encounter for suspected problem with amniotic cavity and membrane ruled out: Secondary | ICD-10-CM

## 2023-12-17 DIAGNOSIS — O23593 Infection of other part of genital tract in pregnancy, third trimester: Secondary | ICD-10-CM | POA: Diagnosis not present

## 2023-12-17 DIAGNOSIS — Z113 Encounter for screening for infections with a predominantly sexual mode of transmission: Secondary | ICD-10-CM | POA: Diagnosis present

## 2023-12-17 DIAGNOSIS — Z3A31 31 weeks gestation of pregnancy: Secondary | ICD-10-CM

## 2023-12-17 LAB — CERVICOVAGINAL ANCILLARY ONLY
Bacterial Vaginitis (gardnerella): NEGATIVE
Candida Glabrata: NEGATIVE
Candida Vaginitis: NEGATIVE
Comment: NEGATIVE
Comment: NEGATIVE
Comment: NEGATIVE

## 2023-12-17 LAB — WET PREP, GENITAL
Sperm: NONE SEEN
Trich, Wet Prep: NONE SEEN
WBC, Wet Prep HPF POC: >=10 — AB (ref <10)
Yeast Wet Prep HPF POC: NONE SEEN

## 2023-12-17 MED ORDER — TERCONAZOLE 0.4 % VA CREA: 1.0000 | TOPICAL_CREAM | Freq: Every day | VAGINAL | 0 refills | Status: DC

## 2023-12-17 MED ORDER — METRONIDAZOLE 500 MG PO TABS: 500.0000 mg | ORAL_TABLET | Freq: Two times a day (BID) | ORAL | 0 refills | Status: DC

## 2023-12-17 MED ORDER — TERCONAZOLE 0.8 % VA CREA: 1.0000 | TOPICAL_CREAM | Freq: Every day | VAGINAL | 0 refills | Status: DC

## 2023-12-17 NOTE — MAU Note (Signed)
.  Kristen Dean is a 23 y.o. at [redacted]w[redacted]d here in MAU reporting: reports dripping clear fluid at 0700 but has been overly wet there for 3 days ongoing cramping that has worsened to day Reports +FM  Denies vaginal bleeding   Onset of complaint: 3 days  Pain score: 6/10 There were no vitals filed for this visit.    Lab orders placed from triage:   ua

## 2023-12-17 NOTE — MAU Provider Note (Signed)
 History     CSN: [id number]  Arrival date and time: 12/17/23 1247   Event Date/Time   First Provider Initiated Contact with Patient 12/17/23 1359      Chief Complaint  Patient presents with   Contractions   Rupture of Membranes   HPI Deslyn Serpa is a 23 y.o. H4E7977 at [redacted]w[redacted]d who presents with LOF. Reports watery discharge for the last 2 days. Says there was initially an odd odor to it but that hasn't continued. Denies abdominal pain or vaginal bleeding. Has had some irritation. Has had intercourse since leaking started. Reports good fetal movement.   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   Generalized abdominal pain 08/04/2020   GERD (gastroesophageal reflux disease)    Learning disability 01/27/2013   Lower abdominal pain 02/14/2022   Tension headache 06/16/2020   Vitamin D  deficiency 02/21/2015    Past Surgical History:  Procedure Laterality Date   WISDOM TOOTH EXTRACTION      Family History  Problem Relation Age of Onset   Diabetes Mother    Hypertension Mother    Diabetes Sister    Mental retardation Sister    Heart disease Maternal Aunt    Diabetes Maternal Aunt    Diabetes Maternal Uncle    Diabetes Maternal Grandmother    Heart disease Maternal Grandfather    Diabetes Maternal Grandfather    Colon cancer Neg Hx    Esophageal cancer Neg Hx    Pancreatic cancer Neg Hx    Stomach cancer Neg Hx     Social History[1]  Allergies: Allergies[2]  No medications prior to admission.    Review of Systems  All other systems reviewed and  are negative.  Physical Exam   Blood pressure 113/60, pulse 80, temperature (!) 97.4 F (36.3 C), resp. rate 20, last menstrual period 04/07/2023, SpO2 100%.  Physical Exam Vitals and nursing note reviewed. Exam conducted with a chaperone present.  Constitutional:      General: She is not in acute distress.    Appearance: Normal appearance. She is not ill-appearing.  HENT:     Head: Normocephalic and atraumatic.  Eyes:     General: No scleral icterus.    Pupils: Pupils are equal, round, and reactive to light.  Pulmonary:     Effort: Pulmonary effort is normal. No respiratory distress.  Abdominal:     Tenderness: There is no abdominal tenderness.     Comments: gravid  Genitourinary:    Comments: SSE: Small amount of yellow mucoid discharge. No pooling of fluid. No blood. Cervix visually closed.  Skin:    General: Skin is warm and dry.  Neurological:     Mental Status: She is alert.    NST:  Baseline: 140 bpm, Variability: Good {> 6 bpm), Accelerations: Reactive, and Decelerations: Absent  MAU  Course  Procedures Results for orders placed or performed during the hospital encounter of 12/17/23 (from the past 24 hours)  Wet prep, genital     Status: Abnormal   Collection Time: 12/17/23  2:41 PM   Specimen: PATH Cytology Cervicovaginal Ancillary Only  Result Value Ref Range   Yeast Wet Prep HPF POC NONE SEEN NONE SEEN   Trich, Wet Prep NONE SEEN NONE SEEN   Clue Cells Wet Prep HPF POC PRESENT (A) NONE SEEN   WBC, Wet Prep HPF POC >=10 (A) <10   Sperm NONE SEEN     MDM   Assessment and Plan   1. Encounter for suspected PROM, with rupture of membranes not found   2. Bacterial vaginosis in pregnancy   3. [redacted] weeks gestation of pregnancy    -SSE performed. No pooling. Fern negative -Wet prep positive for clue cells. Rx flagyl . Patient states gets yeast infections after BV tx. Terconazole  sent to pharmacy -GC/CT pending -Reviewed reasons to return to MAU  Rocky Satterfield 12/17/2023, 3:56 PM      [1]  Social History Tobacco Use   Smoking status: Never   Smokeless tobacco: Never  Vaping Use   Vaping status: Never Used  Substance Use Topics   Alcohol use: Not Currently   Drug use: No  [2]  Allergies Allergen Reactions   Latex Itching

## 2023-12-18 ENCOUNTER — Ambulatory Visit: Payer: MEDICAID

## 2023-12-18 ENCOUNTER — Other Ambulatory Visit: Payer: MEDICAID

## 2023-12-18 VITALS — BP 120/71

## 2023-12-18 DIAGNOSIS — Z3A31 31 weeks gestation of pregnancy: Secondary | ICD-10-CM

## 2023-12-18 DIAGNOSIS — O0993 Supervision of high risk pregnancy, unspecified, third trimester: Secondary | ICD-10-CM | POA: Diagnosis not present

## 2023-12-18 DIAGNOSIS — O365930 Maternal care for other known or suspected poor fetal growth, third trimester, not applicable or unspecified: Secondary | ICD-10-CM

## 2023-12-18 DIAGNOSIS — O0990 Supervision of high risk pregnancy, unspecified, unspecified trimester: Secondary | ICD-10-CM

## 2023-12-18 DIAGNOSIS — O24419 Gestational diabetes mellitus in pregnancy, unspecified control: Secondary | ICD-10-CM

## 2023-12-18 DIAGNOSIS — O99213 Obesity complicating pregnancy, third trimester: Secondary | ICD-10-CM | POA: Insufficient documentation

## 2023-12-18 DIAGNOSIS — E119 Type 2 diabetes mellitus without complications: Secondary | ICD-10-CM | POA: Diagnosis not present

## 2023-12-18 DIAGNOSIS — O24410 Gestational diabetes mellitus in pregnancy, diet controlled: Secondary | ICD-10-CM | POA: Insufficient documentation

## 2023-12-18 DIAGNOSIS — E669 Obesity, unspecified: Secondary | ICD-10-CM | POA: Diagnosis not present

## 2023-12-18 DIAGNOSIS — O99212 Obesity complicating pregnancy, second trimester: Secondary | ICD-10-CM | POA: Diagnosis not present

## 2023-12-18 DIAGNOSIS — O24113 Pre-existing diabetes mellitus, type 2, in pregnancy, third trimester: Secondary | ICD-10-CM | POA: Diagnosis not present

## 2023-12-18 LAB — GC/CHLAMYDIA PROBE AMP (~~LOC~~) NOT AT ARMC
Chlamydia: NEGATIVE
Comment: NEGATIVE
Comment: NORMAL
Neisseria Gonorrhea: NEGATIVE

## 2023-12-18 NOTE — Progress Notes (Signed)
 Patient information  Patient Name: Kristen Dean  Patient MRN:   [id number]  Referring practice: MFM Referring Provider: [facility])  Problem List   Patient Active Problem List   Diagnosis Date Noted   Gestational diabetes, diet controlled 12/03/2023   Poor fetal growth affecting management of mother in third trimester 12/03/2023   Obesity affecting pregnancy, antepartum 10/26/2023   Supervision of high risk pregnancy, antepartum 07/24/2023    Maternal Fetal medicine Consult  Maecie Difiore is a 23 y.o. H4E7977 at [redacted]w[redacted]d here for ultrasound and consultation. Brigett Demir is doing well today with no acute concerns. Today we focused on the following:   The patient is seen today for a limited ultrasound and Doppler evaluation in the setting of fetal growth restriction with diet-controlled diabetes. She reports excellent blood glucose control and good fetal movement. Ultrasound findings today are reassuring, with normal amniotic fluid volume, normal umbilical artery Doppler waveforms, and normal fetal movement and tone. Ongoing surveillance was reviewed, and plans for continued close monitoring were discussed.  NST was reactive as well.  Recommendations -Weekly umbilical artery Dopplers and antenatal testing -Serial growth ultrasounds approximately every 3 weeks, as indicated -Continue dietary management of diabetes with ongoing glucose monitoring -Daily fetal movement monitoring -Return sooner for evaluation with decreased fetal movement or changes in maternal or fetal status  Standard OB precautions were given to the patient. The patient had time to ask questions that were answered to her satisfaction.  She verbalized understanding and agrees to proceed with the plan above.   I spent 20 minutes reviewing the patients chart, including labs and images as well as counseling the patient about her medical conditions. Greater than 50% of the time was  spent in direct face-to-face patient counseling.  Delora Smaller  MFM, [place]   12/18/2023  4:19 PM   Review of Systems: A review of systems was performed and was negative except per HPI   Vitals and Physical Exam    12/18/2023   11:47 AM 12/17/2023    1:34 PM 12/13/2023    2:31 PM  Vitals with BMI  Weight   236 lbs 6 oz  Systolic 120 113 880  Diastolic 71 60 65  Pulse  80 69    Sitting comfortably on the sonogram table Nonlabored breathing Normal rate and rhythm Abdomen is nontender  Past pregnancies OB History  Gravida Para Term Preterm AB Living  5 2 2  2 2   SAB IAB Ectopic Multiple Live Births  2   0 2    # Outcome Date GA Lbr Len/2nd Weight Sex Type Anes PTL Lv  5 Current           4 Term 03/14/20 [redacted]w[redacted]d 03:26 / 02:58 8 lb (3.629 kg) M Vag-Spont EPI  LIV  3 SAB 03/08/19 [redacted]w[redacted]d   U SAB     2 Term 09/27/17 [redacted]w[redacted]d / 02:58 7 lb 13.9 oz (3.569 kg) M Vag-Spont EPI  LIV  1 SAB 2018 [redacted]w[redacted]d           Obstetric Comments  Pregnancy #2 Baby Boy, Vit D Deficiency, Fetal Pyelectasis that resolved     Future Appointments  Date Time Provider Department Center  12/25/2023  4:15 PM Wallace Joesph DELENA DORISTINE [facility]  12/28/2023  7:15 AM [facility] PROVIDER 1 [facility]  12/28/2023  7:30 AM [facility] US2 [facility]  12/28/2023  8:45 AM [facility] NST [facility]  01/08/2024  9:35 AM Cooleen,  Olam LABOR, NP [facility]  01/22/2024 10:55 AM [name] [name] LABOR ED[facility]  01/29/2024 11:15 AM [name] [name] LABOR EDDY [facility]  02/05/2024 11:15 AM Nicholaus Burnard HERO, MD [facility]  02/12/2024 11:15 AM [facility]AL 2 [facility]  02/19/2024 11:15 AM [facility]-GENERAL 2 [facility]

## 2023-12-18 NOTE — Procedures (Signed)
 Kristen Dean [birth year] [redacted]w[redacted]d  Fetus A Non-Stress Test Interpretation for 12/18/2023  Indication: IUGR and Gestational Diabetes   Fetal Heart Rate A Mode: External Baseline Rate (A): 150 bpm Variability: Moderate Accelerations: 10 x 10 Decelerations: None Multiple birth?: No  Uterine Activity Mode: Palpation, Toco Contraction Frequency (min): none noted Resting Tone Palpated: Relaxed  Interpretation (Fetal Testing) Nonstress Test Interpretation: Reactive Comments: Reviewed with Dr. William

## 2023-12-25 ENCOUNTER — Ambulatory Visit (INDEPENDENT_AMBULATORY_CARE_PROVIDER_SITE_OTHER): Payer: MEDICAID | Admitting: Family Medicine

## 2023-12-25 ENCOUNTER — Other Ambulatory Visit: Payer: Self-pay

## 2023-12-25 ENCOUNTER — Other Ambulatory Visit (HOSPITAL_COMMUNITY)
Admission: RE | Admit: 2023-12-25 | Discharge: 2023-12-25 | Disposition: A | Payer: MEDICAID | Source: Ambulatory Visit | Attending: Family Medicine | Admitting: Family Medicine

## 2023-12-25 VITALS — BP 123/73 | HR 99 | Wt 238.6 lb

## 2023-12-25 DIAGNOSIS — O365930 Maternal care for other known or suspected poor fetal growth, third trimester, not applicable or unspecified: Secondary | ICD-10-CM

## 2023-12-25 DIAGNOSIS — O99213 Obesity complicating pregnancy, third trimester: Secondary | ICD-10-CM

## 2023-12-25 DIAGNOSIS — O24410 Gestational diabetes mellitus in pregnancy, diet controlled: Secondary | ICD-10-CM | POA: Diagnosis not present

## 2023-12-25 DIAGNOSIS — O0993 Supervision of high risk pregnancy, unspecified, third trimester: Secondary | ICD-10-CM

## 2023-12-25 DIAGNOSIS — O0990 Supervision of high risk pregnancy, unspecified, unspecified trimester: Secondary | ICD-10-CM

## 2023-12-25 DIAGNOSIS — O99210 Obesity complicating pregnancy, unspecified trimester: Secondary | ICD-10-CM

## 2023-12-25 DIAGNOSIS — Z3A32 32 weeks gestation of pregnancy: Secondary | ICD-10-CM

## 2023-12-25 DIAGNOSIS — N898 Other specified noninflammatory disorders of vagina: Secondary | ICD-10-CM

## 2023-12-25 NOTE — Progress Notes (Signed)
 "  PRENATAL VISIT NOTE  Subjective:  Kristen Dean is a 23 y.o. H4E7977 at [redacted]w[redacted]d being seen today for ongoing prenatal care.  She is currently monitored for the following issues for this high-risk pregnancy and has Supervision of high risk pregnancy, antepartum; Obesity affecting pregnancy, antepartum; Gestational diabetes, diet controlled; and Poor fetal growth affecting management of mother in third trimester on their problem list.  Patient reports vaginal irritation.  Contractions: Not present. Vag. Bleeding: None.  Movement: Present. Denies leaking of fluid.   The following portions of the patient's history were reviewed and updated as appropriate: allergies, current medications, past family history, past medical history, past social history, past surgical history and problem list.   Objective:   Vitals:   12/25/23 1636  BP: 123/73  Pulse: 99  Weight: 238 lb 9.6 oz (108.2 kg)    Fetal Status:  Fetal Heart Rate (bpm): 151   Movement: Present    General: Alert, oriented and cooperative. Patient is in no acute distress.  Skin: Skin is warm and dry. No rash noted.   Cardiovascular: Normal heart rate noted  Respiratory: Normal respiratory effort, no problems with respiration noted  Abdomen: Soft, gravid, appropriate for gestational age.  Pain/Pressure: Present     Pelvic: Cervical exam deferred        Extremities: Normal range of motion.  Edema: None  Mental Status: Normal mood and affect. Normal behavior. Normal judgment and thought content.      11/28/2023   10:42 AM 08/01/2023    9:38 AM 06/19/2023   10:04 AM  Depression screen PHQ 2/9  Decreased Interest 0 0 0  Down, Depressed, Hopeless 0 0 0  PHQ - 2 Score 0 0 0  Altered sleeping  1 1  Tired, decreased energy  1 1  Change in appetite  2 0  Feeling bad or failure about yourself   0 0  Trouble concentrating  0 1  Moving slowly or fidgety/restless  0 0  Suicidal thoughts  0 0  PHQ-9 Score  4  3      Data saved with  a previous flowsheet row definition        08/01/2023    9:38 AM  GAD 7 : Generalized Anxiety Score  Nervous, Anxious, on Edge 0  Control/stop worrying 0  Worry too much - different things 0  Trouble relaxing 2  Restless 0  Easily annoyed or irritable 0  Afraid - awful might happen 0  Total GAD 7 Score 2    Assessment and Plan:  Pregnancy: [id number] at [redacted]w[redacted]d 1. Supervision of high risk pregnancy, antepartum (Primary) Prenatal course reviewed BP, HR, FHR within normal limits Feeling regular FM   2. Diet controlled gestational diabetes mellitus (GDM) in third trimester Reports fasting <90 and postprandial <120  3. Poor fetal growth affecting management of mother in third trimester, single or unspecified fetus Weekly umbilical artery Dopplers and antenatal testing Serial growth US  every 3 weeks Follow up with MFM as scheduled  4. Obesity affecting pregnancy, antepartum, unspecified obesity type Continue aspirin  81 mg daily   5. [redacted] weeks gestation of pregnancy Follow up in 2 weeks  Preterm labor symptoms and general obstetric precautions including but not limited to vaginal bleeding, contractions, leaking of fluid and fetal movement were reviewed in detail with the patient. Please refer to After Visit Summary for other counseling recommendations.   Return in about 2 weeks (around 01/08/2024) for [facility].  Future Appointments  Date Time Provider Department  Center  12/28/2023  7:15 AM [facility] PROVIDER 1 [facility]  12/28/2023  7:30 AM [facility] US2 [facility]  12/28/2023  8:45 AM [facility] NST [facility]  01/08/2024  9:35 AM Cooleen, Olam LABOR, NP [facility]  01/22/2024 10:55 AM [name] [name] LABOR, CNM [facility]  01/29/2024 11:15 AM [name] [name] LABOR, CNM [facility]  02/05/2024 11:15 AM Nicholaus Burnard HERO, MD [facility]  02/12/2024 11:15 AM [facility]GENERAL 2 [facility]  02/19/2024 11:15 AM [facility]AL 2 [facility]    Joesph LABOR Sear, PA "

## 2023-12-25 NOTE — Addendum Note (Signed)
 Addended by: LENNIE RONCO T on: 12/25/2023 05:14 PM   Modules accepted: Orders

## 2023-12-28 ENCOUNTER — Ambulatory Visit: Payer: MEDICAID | Admitting: Maternal & Fetal Medicine

## 2023-12-28 ENCOUNTER — Ambulatory Visit: Payer: MEDICAID

## 2023-12-28 ENCOUNTER — Other Ambulatory Visit: Payer: Self-pay | Admitting: Obstetrics and Gynecology

## 2023-12-28 VITALS — BP 115/73

## 2023-12-28 DIAGNOSIS — E669 Obesity, unspecified: Secondary | ICD-10-CM | POA: Diagnosis not present

## 2023-12-28 DIAGNOSIS — O99213 Obesity complicating pregnancy, third trimester: Secondary | ICD-10-CM | POA: Insufficient documentation

## 2023-12-28 DIAGNOSIS — Z3A33 33 weeks gestation of pregnancy: Secondary | ICD-10-CM | POA: Insufficient documentation

## 2023-12-28 DIAGNOSIS — O365990 Maternal care for other known or suspected poor fetal growth, unspecified trimester, not applicable or unspecified: Secondary | ICD-10-CM | POA: Diagnosis present

## 2023-12-28 DIAGNOSIS — Z362 Encounter for other antenatal screening follow-up: Secondary | ICD-10-CM | POA: Insufficient documentation

## 2023-12-28 DIAGNOSIS — O0990 Supervision of high risk pregnancy, unspecified, unspecified trimester: Secondary | ICD-10-CM

## 2023-12-28 DIAGNOSIS — O365930 Maternal care for other known or suspected poor fetal growth, third trimester, not applicable or unspecified: Secondary | ICD-10-CM | POA: Diagnosis not present

## 2023-12-28 DIAGNOSIS — O24410 Gestational diabetes mellitus in pregnancy, diet controlled: Secondary | ICD-10-CM | POA: Insufficient documentation

## 2023-12-28 NOTE — Progress Notes (Signed)
 "  Patient information  Patient Name: Kristen Dean  Patient MRN:   [id number]  Referring practice: MFM Referring Provider: [facility])  Problem List   Patient Active Problem List   Diagnosis Date Noted   Gestational diabetes, diet controlled 12/03/2023   Poor fetal growth affecting management of mother in third trimester 12/03/2023   Obesity affecting pregnancy, antepartum 10/26/2023   Supervision of high risk pregnancy, antepartum 07/24/2023    Maternal Fetal medicine Consult  Avaeh Moehring is a 23 y.o. H4E7977 at [redacted]w[redacted]d here for ultrasound and consultation. Danicia Schar is doing well today with no acute concerns. Today we focused on the following:   The patient presents for a follow-up growth ultrasound due to diet-controlled gestational diabetes mellitus. She reports that nearly all home blood glucose values are at goal. On prior growth ultrasound, the estimated fetal weight was at the 10th percentile; today, interval growth is appropriate with an estimated fetal weight at the 23rd percentile. Given well-controlled blood sugars and reassuring fetal growth, there is no indication for umbilical artery Doppler studies or a biophysical profile at this time, although the fetus would have passed a biophysical profile if performed. The patient reports good fetal movement. She will return in four weeks for repeat growth assessment. Fetal movement precautions were reviewed, and she was encouraged to continue her diabetic diet and moderate exercise.  RECOMMENDATIONS -Continue diet-controlled gestational diabetes management with home glucose monitoring -Maintain diabetic diet and moderate exercise as tolerated -Fetal movement precautions reviewed -No doppler studies or biophysical profile indicated at this time -Return in four weeks for follow-up growth ultrasound  The patient had time to ask questions that were answered to her satisfaction.  She  verbalized understanding and agrees to proceed with the plan above.   I spent 30 minutes reviewing the patients chart, including labs and images as well as counseling the patient about her medical conditions. Greater than 50% of the time was spent in direct face-to-face patient counseling.  Delora Smaller  MFM, [place]   12/28/2023  8:57 AM   Review of Systems: A review of systems was performed and was negative except per HPI   Vitals and Physical Exam    12/28/2023    7:37 AM 12/25/2023    4:36 PM 12/18/2023   11:47 AM  Vitals with BMI  Weight  238 lbs 10 oz   Systolic 115 123 879  Diastolic 73 73 71  Pulse  99     Sitting comfortably on the sonogram table Nonlabored breathing Normal rate and rhythm Abdomen is nontender  Past pregnancies OB History  Gravida Para Term Preterm AB Living  5 2 2  2 2   SAB IAB Ectopic Multiple Live Births  2   0 2    # Outcome Date GA Lbr Len/2nd Weight Sex Type Anes PTL Lv  5 Current           4 Term 03/14/20 [redacted]w[redacted]d 03:26 / 02:58 8 lb (3.629 kg) M Vag-Spont EPI  LIV  3 SAB 03/08/19 [redacted]w[redacted]d   U SAB     2 Term 09/27/17 [redacted]w[redacted]d / 02:58 7 lb 13.9 oz (3.569 kg) M Vag-Spont EPI  LIV  1 SAB 2018 [redacted]w[redacted]d           Obstetric Comments  Pregnancy #2 Baby Boy, Vit D Deficiency, Fetal Pyelectasis that resolved     Future Appointments  Date Time Provider Department Center  01/08/2024  9:35 AM Littie Planas  A, NP [facility]  01/22/2024 10:55 AM [name] [name] DELENA EDDY [facility]  01/25/2024  9:15 AM [facility] PROVIDER 1 [facility]  01/25/2024  9:30 AM [facility] US1 [facility]  01/29/2024 11:15 AM [name] [name] DELENA EDDY [facility]  02/05/2024 11:15 AM Nicholaus Burnard HERO, MD [facility]  02/12/2024 11:15 AM WMC-GENERAL 2 [facility]  02/19/2024 11:15 AM [facility]-GENERAL 2 [facility]      "

## 2023-12-28 NOTE — Progress Notes (Signed)
 Patient reports that she checks blood glucose at home and readings are as follows: Fasting levels range from 76 to 90, and two hour post-meal reading ranges from 96.to 136.

## 2023-12-31 LAB — CERVICOVAGINAL ANCILLARY ONLY
Bacterial Vaginitis (gardnerella): NEGATIVE
Candida Glabrata: NEGATIVE
Candida Vaginitis: NEGATIVE
Chlamydia: NEGATIVE
Comment: NEGATIVE
Comment: NEGATIVE
Comment: NEGATIVE
Comment: NEGATIVE
Comment: NEGATIVE
Comment: NORMAL
Neisseria Gonorrhea: NEGATIVE
Trichomonas: NEGATIVE

## 2024-01-03 NOTE — L&D Delivery Note (Addendum)
 OB/GYN Faculty Practice Delivery Note  Kristen Dean is a 24 y.o. [id number] s/p NSVD at [redacted]w[redacted]d. She was admitted for IOL for GDMA1.   ROM: 4h 47m with meconium fluid GBS Status: negative  Maximum Maternal Temperature: 97.86F  Labor Progress: Initial SVE 1/20/-3. Induction with AROM, cytotec , and pitocin . Progressed to complete.  Delivery Date/Time: 02/08/24 1445 Delivery: Called to room and patient was complete and pushing. Head delivered bregma anterior. No nuchal cord present. Shoulder and body delivered in usual fashion. Infant with spontaneous cry, placed on mother's abdomen, dried and stimulated. Cord clamped x 2 after 1-minute delay and cut. Cord blood drawn. Placenta delivered spontaneously, intact, with 3-vessel cord. Fundus firm with massage and Pitocin . Labia, perineum, vagina, and cervix inspected, no lacerations requiring hemostatic repair.   Placenta: intact Complications: none Lacerations: none QBL: 496 Analgesia: epidural  Infant: viable female  APGARs 8,9  3120 g  Elio Alena Morrison, MD 02/08/2024, 6:01 PM  I was present for the entire delivery of baby and placenta and inspection of pericuem and agree with above.  Claudene, Zara Wendt , CNM 02/08/2024 6:19 PM

## 2024-01-07 ENCOUNTER — Encounter (HOSPITAL_COMMUNITY): Payer: Self-pay | Admitting: Family Medicine

## 2024-01-07 ENCOUNTER — Inpatient Hospital Stay (HOSPITAL_COMMUNITY)
Admission: AD | Admit: 2024-01-07 | Discharge: 2024-01-07 | Disposition: A | Discharge status: Home / Self Care | Payer: MEDICAID | Attending: Obstetrics and Gynecology | Admitting: Obstetrics and Gynecology

## 2024-01-07 DIAGNOSIS — R0602 Shortness of breath: Secondary | ICD-10-CM | POA: Diagnosis present

## 2024-01-07 DIAGNOSIS — K219 Gastro-esophageal reflux disease without esophagitis: Secondary | ICD-10-CM

## 2024-01-07 DIAGNOSIS — Z3689 Encounter for other specified antenatal screening: Secondary | ICD-10-CM | POA: Diagnosis not present

## 2024-01-07 DIAGNOSIS — Z3A34 34 weeks gestation of pregnancy: Secondary | ICD-10-CM | POA: Insufficient documentation

## 2024-01-07 DIAGNOSIS — O219 Vomiting of pregnancy, unspecified: Secondary | ICD-10-CM | POA: Diagnosis present

## 2024-01-07 DIAGNOSIS — R0789 Other chest pain: Secondary | ICD-10-CM | POA: Diagnosis present

## 2024-01-07 DIAGNOSIS — O99613 Diseases of the digestive system complicating pregnancy, third trimester: Secondary | ICD-10-CM | POA: Diagnosis not present

## 2024-01-07 LAB — URINALYSIS, ROUTINE W REFLEX MICROSCOPIC
Bilirubin Urine: NEGATIVE
Glucose, UA: NEGATIVE mg/dL
Ketones, ur: 80 mg/dL — AB
Leukocytes,Ua: NEGATIVE
Nitrite: NEGATIVE
Protein, ur: 100 mg/dL — AB
Specific Gravity, Urine: 1.025 (ref 1.005–1.030)
pH: 5 (ref 5.0–8.0)

## 2024-01-07 MED ORDER — FAMOTIDINE IN NACL 20-0.9 MG/50ML-% IV SOLN
20.0000 mg | Freq: Once | INTRAVENOUS | Status: AC
  Administered 2024-01-07: 20 mg via INTRAVENOUS
  Filled 2024-01-07: qty 50

## 2024-01-07 MED ORDER — HYOSCYAMINE SULFATE 0.125 MG SL SUBL
0.2500 mg | SUBLINGUAL_TABLET | Freq: Once | SUBLINGUAL | Status: AC
  Administered 2024-01-07: 0.25 mg via SUBLINGUAL
  Filled 2024-01-07: qty 2

## 2024-01-07 MED ORDER — SCOPOLAMINE 1 MG/3DAYS TD PT72
1.0000 | MEDICATED_PATCH | Freq: Once | TRANSDERMAL | Status: DC
  Administered 2024-01-07: 1 mg via TRANSDERMAL
  Filled 2024-01-07: qty 1

## 2024-01-07 MED ORDER — LACTATED RINGERS IV BOLUS
1000.0000 mL | Freq: Once | INTRAVENOUS | Status: AC
  Administered 2024-01-07: 1000 mL via INTRAVENOUS

## 2024-01-07 MED ORDER — SODIUM CHLORIDE 0.9 % IV SOLN
25.0000 mg | Freq: Once | INTRAVENOUS | Status: AC
  Administered 2024-01-07: 25 mg via INTRAVENOUS
  Filled 2024-01-07: qty 1

## 2024-01-07 MED ORDER — PANTOPRAZOLE SODIUM 40 MG PO TBEC: 40.0000 mg | DELAYED_RELEASE_TABLET | Freq: Every day | ORAL | 0 refills | Status: AC

## 2024-01-07 MED ORDER — ALUM & MAG HYDROXIDE-SIMETH 200-200-20 MG/5ML PO SUSP
30.0000 mL | Freq: Once | ORAL | Status: AC
  Administered 2024-01-07: 30 mL via ORAL
  Filled 2024-01-07: qty 30

## 2024-01-07 NOTE — MAU Note (Addendum)
..  Kristen Dean is a 24 y.o. at [redacted]w[redacted]d here in MAU reporting: shortness of breath that began 2 days ago. Reports intermittent chest pain that began today. Believes pain might be d/t acid reflux, has not taken any medication for acid reflux. Initially the pain was burning but now it is pressure.  Has had nausea and vomiting throughout the pregnancy. Reports 4 episodes of emesis, reports last two times there was blood in it.  Reports constant upper abdominal pain that began today, that feels like cramping.  Decreased fetal movement the last 2 days, last time she felt baby move was an hour ago.  Denies vaginal bleeding or leaking of fluid.  Pt is requesting vaginal swabs to test for yeast infection and BV, because last time she had abdominal cramping it was because of BV. Pain score: chest pain 7/10; abdominal pain 8/10 Vitals:   01/07/24 0059  BP: (!) 99/56  Pulse: (!) 110  Resp: 17  Temp: 97.7 F (36.5 C)  SpO2: 100%     FHT:145 Lab orders placed from triage: UA

## 2024-01-07 NOTE — MAU Provider Note (Signed)
 " History     CSN: [id number]  Arrival date and time: 01/07/24 0025   Event Date/Time   First Provider Initiated Contact with Patient 01/07/24 0214      Chief Complaint  Patient presents with   Abdominal Pain   Chest Pain   HPI Kristen Dean is a 24 y.o. year old [id number] female at [redacted]w[redacted]d weeks gestation who presents to MAU reporting being SOB x 2 days and intermittent chest pain that she thinks is reflux; rated 7/10. She hasn't taken any medications for the reflux. She describes the pain starting out as burning, but it is now pressure. She has had N/V throughout this pregnancy. She reports she has vomited 4 times today; the last 2 episodes had blood in it. She reports increased abdominal pain/cramping; rated 8/10. She reports decreased FM x 2 days; last movement felt 1 hour prior to arriving in MAU. She receives [facility]; next appt is 01/08/2024.   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   Generalized abdominal pain 08/04/2020   GERD (gastroesophageal reflux disease)    Learning disability 01/27/2013   Lower abdominal pain 02/14/2022   Tension headache 06/16/2020   Vitamin D  deficiency 02/21/2015    Past Surgical History:  Procedure Laterality Date   WISDOM TOOTH EXTRACTION      Family History  Problem Relation Age of Onset   Diabetes Mother    Hypertension Mother    Diabetes Sister    Mental retardation Sister    Heart disease Maternal Aunt    Diabetes Maternal Aunt    Diabetes Maternal Uncle    Diabetes Maternal Grandmother    Cancer  Maternal Grandfather    Heart disease Maternal Grandfather    Diabetes Maternal Grandfather    Cancer Other    Colon cancer Neg Hx    Esophageal cancer Neg Hx    Pancreatic cancer Neg Hx    Stomach cancer Neg Hx    Asthma Neg Hx     Social History[1]  Allergies: Allergies[2]  No medications prior to admission.    Review of Systems  Constitutional: Negative.   HENT: Negative.    Eyes: Negative.   Respiratory: Negative.    Cardiovascular: Negative.   Gastrointestinal:  Positive for abdominal pain (upper abdomen burning/pressure, cramping), nausea and vomiting.  Endocrine: Negative.   Genitourinary: Negative.   Musculoskeletal: Negative.   Skin: Negative.   Allergic/Immunologic: Negative.   Neurological: Negative.   Hematological: Negative.   Psychiatric/Behavioral: Negative.     Physical Exam   Blood pressure (!) 114/55, pulse 88, temperature 97.7 F (36.5 C), temperature source Oral, resp. rate 17, height 5' 8 (1.727 m), weight 103.5 kg, last menstrual period 04/07/2023, SpO2 93%.  Physical Exam Vitals and nursing note reviewed.  Constitutional:  Appearance: Normal appearance. She is normal weight.  Cardiovascular:     Rate and Rhythm: Tachycardia present.  Pulmonary:     Effort: Pulmonary effort is normal.  Abdominal:     Palpations: Abdomen is soft.  Neurological:     Mental Status: She is alert and oriented to person, place, and time.  Psychiatric:        Mood and Affect: Mood normal.        Behavior: Behavior normal.        Thought Content: Thought content normal.        Judgment: Judgment normal.   REACTIVE NST - FHR: 135 bpm / moderate variability / accels present / decels absent / TOCO: 1 UC noted  Reassessment @ 0400: Patient feeling somewhat better. Requesting Rx for reflux medication. MAU Course  Procedures  MDM CCUA Start IV LR 1 liter bolus Phenergan  25 mg in 200 mg NS IVPB Pepcid  20 mg IVPB Scope Patch x 1 G.I. Cocktail  Results  for orders placed or performed during the hospital encounter of 01/07/24 (from the past 24 hours)  Urinalysis, Routine w reflex microscopic -Urine, Clean Catch     Status: Abnormal   Collection Time: 01/07/24 12:53 AM  Result Value Ref Range   Color, Urine YELLOW YELLOW   APPearance HAZY (A) CLEAR   Specific Gravity, Urine 1.025 1.005 - 1.030   pH 5.0 5.0 - 8.0   Glucose, UA NEGATIVE NEGATIVE mg/dL   Hgb urine dipstick SMALL (A) NEGATIVE   Bilirubin Urine NEGATIVE NEGATIVE   Ketones, ur 80 (A) NEGATIVE mg/dL   Protein, ur 899 (A) NEGATIVE mg/dL   Nitrite NEGATIVE NEGATIVE   Leukocytes,Ua NEGATIVE NEGATIVE   RBC / HPF 6-10 0 - 5 RBC/hpf   WBC, UA 0-5 0 - 5 WBC/hpf   Bacteria, UA RARE (A) NONE SEEN   Squamous Epithelial / HPF 0-5 0 - 5 /HPF   Mucus PRESENT     Assessment and Plan  1. NST (non-stress test) reactive (Primary) - Category 1 FHR tracing  2. Gastroesophageal reflux during pregnancy in third trimester, antepartum - Prescription for: Protonix  40 mg po daily  3. Nausea and vomiting during pregnancy - Advised Scope patch would manage N/V x 72 hrs  4. [redacted] weeks gestation of pregnancy   - Discharge home - Keep scheduled appt with [facility] on 01/08/2024 - Patient verbalized an understanding of the plan of care and agrees.   Ala Cart, CNM 01/07/2024, 2:14 AM      [1]  Social History Tobacco Use   Smoking status: Never   Smokeless tobacco: Never  Vaping Use   Vaping status: Never Used  Substance Use Topics   Alcohol use: Not Currently   Drug use: No  [2]  Allergies Allergen Reactions   Latex Itching   "

## 2024-01-08 ENCOUNTER — Encounter: Payer: Self-pay | Admitting: Nurse Practitioner

## 2024-01-08 ENCOUNTER — Other Ambulatory Visit (HOSPITAL_COMMUNITY)
Admission: RE | Admit: 2024-01-08 | Discharge: 2024-01-08 | Disposition: A | Discharge status: Home / Self Care | Payer: MEDICAID | Source: Ambulatory Visit | Attending: Nurse Practitioner | Admitting: Nurse Practitioner

## 2024-01-08 ENCOUNTER — Ambulatory Visit (INDEPENDENT_AMBULATORY_CARE_PROVIDER_SITE_OTHER): Payer: MEDICAID | Admitting: Nurse Practitioner

## 2024-01-08 VITALS — BP 109/74 | HR 85 | Wt 228.0 lb

## 2024-01-08 DIAGNOSIS — N9489 Other specified conditions associated with female genital organs and menstrual cycle: Secondary | ICD-10-CM | POA: Diagnosis present

## 2024-01-08 DIAGNOSIS — O99210 Obesity complicating pregnancy, unspecified trimester: Secondary | ICD-10-CM

## 2024-01-08 DIAGNOSIS — O99213 Obesity complicating pregnancy, third trimester: Secondary | ICD-10-CM | POA: Diagnosis not present

## 2024-01-08 DIAGNOSIS — O24410 Gestational diabetes mellitus in pregnancy, diet controlled: Secondary | ICD-10-CM | POA: Diagnosis not present

## 2024-01-08 DIAGNOSIS — O0993 Supervision of high risk pregnancy, unspecified, third trimester: Secondary | ICD-10-CM

## 2024-01-08 DIAGNOSIS — Z3A34 34 weeks gestation of pregnancy: Secondary | ICD-10-CM | POA: Diagnosis not present

## 2024-01-08 DIAGNOSIS — O219 Vomiting of pregnancy, unspecified: Secondary | ICD-10-CM

## 2024-01-08 DIAGNOSIS — O99613 Diseases of the digestive system complicating pregnancy, third trimester: Secondary | ICD-10-CM | POA: Diagnosis not present

## 2024-01-08 DIAGNOSIS — N898 Other specified noninflammatory disorders of vagina: Secondary | ICD-10-CM

## 2024-01-08 DIAGNOSIS — K219 Gastro-esophageal reflux disease without esophagitis: Secondary | ICD-10-CM

## 2024-01-08 DIAGNOSIS — O0990 Supervision of high risk pregnancy, unspecified, unspecified trimester: Secondary | ICD-10-CM

## 2024-01-08 MED ORDER — FAMOTIDINE 20 MG PO TABS: 20.0000 mg | ORAL_TABLET | Freq: Two times a day (BID) | ORAL | 2 refills | Status: AC | PRN

## 2024-01-08 NOTE — Patient Instructions (Signed)

## 2024-01-08 NOTE — Progress Notes (Signed)
" ° °  PRENATAL VISIT NOTE  Subjective:  Kristen Dean is a 24 y.o. H4E7977 at [redacted]w[redacted]d being seen today for ongoing prenatal care.  She is currently monitored for the following issues for this high-risk pregnancy and has Supervision of high risk pregnancy, antepartum; Obesity affecting pregnancy, antepartum; Gestational diabetes, diet controlled; and Poor fetal growth affecting management of mother in third trimester on their problem list.  Patient reports vaginal irration .  Contractions: Irritability. Vag. Bleeding: None.  Movement: Present. Denies leaking of fluid.   The following portions of the patient's history were reviewed and updated as appropriate: allergies, current medications, past family history, past medical history, past social history, past surgical history and problem list.   Objective:   Vitals:   01/08/24 1042  BP: 109/74  Pulse: 85  Weight: 228 lb (103.4 kg)    Fetal Status: Fetal Heart Rate (bpm): 136   Movement: Present     General:  Alert, oriented and cooperative. Patient is in no acute distress.  Skin: Skin is warm and dry. No rash noted.   Cardiovascular: Normal heart rate noted  Respiratory: Normal respiratory effort, no problems with respiration noted  Abdomen: Soft, gravid, appropriate for gestational age.  Pain/Pressure: Absent     Pelvic: Cervical exam deferred        Extremities: Normal range of motion.  Edema: None  Mental Status: Normal mood and affect. Normal behavior. Normal judgment and thought content.   Assessment and Plan:  Pregnancy: H4E7977 at [redacted]w[redacted]d  Vaginal burning Self swabs obtained  Vaginal swabs  Diet controlled gestational diabetes mellitus (GDM) in third trimester Diet controlled Patient did not bring BS logs Encouraged to bring CBG logs at each visit Reports FBS < 90 and 2 hr PP <130 the majority of the time per patient verbal report  MFM Antenatal testing as scheduled for fetal growth and as indicated per MFM  12/28/23 sono  reviewed   Est. FW: 1947 gm 4 lb 5 oz 22 %   [redacted] weeks gestation of pregnancy FHR FH Appropriate  Obesity affecting pregnancy, antepartum, unspecified obesity type Weight gain appropriate  Gastroesophageal reflux during pregnancy in third trimester, antepartum Pepcid  RX sent  GERD diet reviewed with patient  Supervision of high risk pregnancy, antepartum   Nausea and vomiting during pregnancy Taking antiemetics prn  Vaginal irritation Swabs obtained Will f/u if treatment needed     Preterm labor symptoms and general obstetric precautions including but not limited to vaginal bleeding, contractions, leaking of fluid and fetal movement were reviewed in detail with the patient.  Please refer to After Visit Summary for other counseling recommendations.    Future Appointments  Date Time Provider Department Center  01/22/2024 10:55 AM [name] [name] DELENA EDDY [facility]  01/25/2024  3:15 PM [facility] PROVIDER 1 [facility]  01/25/2024  3:30 PM [facility] US1 [facility]  01/29/2024 11:15 AM [name] [name] DELENA EDDY [facility]  02/05/2024 11:15 AM Nicholaus Burnard HERO, MD [facility]  02/12/2024 11:15 AM [facility]-GENERAL 2 [facility]  02/19/2024 11:15 AM [place], [name] DELENA, CNM [facility]   Olam Dalton, MSN, WHNP-BC [facility] [employer]  "

## 2024-01-09 ENCOUNTER — Encounter: Payer: Self-pay | Admitting: Nurse Practitioner

## 2024-01-09 LAB — CERVICOVAGINAL ANCILLARY ONLY
Bacterial Vaginitis (gardnerella): NEGATIVE
Candida Glabrata: NEGATIVE
Candida Vaginitis: NEGATIVE
Chlamydia: NEGATIVE
Comment: NEGATIVE
Comment: NEGATIVE
Comment: NEGATIVE
Comment: NEGATIVE
Comment: NEGATIVE
Comment: NORMAL
Neisseria Gonorrhea: NEGATIVE
Trichomonas: NEGATIVE

## 2024-01-10 ENCOUNTER — Other Ambulatory Visit: Payer: Self-pay | Admitting: *Deleted

## 2024-01-10 DIAGNOSIS — O219 Vomiting of pregnancy, unspecified: Secondary | ICD-10-CM

## 2024-01-10 MED ORDER — SCOPOLAMINE 1 MG/3DAYS TD PT72: 1.0000 | MEDICATED_PATCH | TRANSDERMAL | 0 refills | Status: AC

## 2024-01-22 ENCOUNTER — Ambulatory Visit (INDEPENDENT_AMBULATORY_CARE_PROVIDER_SITE_OTHER): Payer: MEDICAID | Admitting: Certified Nurse Midwife

## 2024-01-22 ENCOUNTER — Other Ambulatory Visit (HOSPITAL_COMMUNITY)
Admission: RE | Admit: 2024-01-22 | Discharge: 2024-01-22 | Disposition: A | Discharge status: Home / Self Care | Payer: MEDICAID | Source: Ambulatory Visit | Attending: Certified Nurse Midwife | Admitting: Certified Nurse Midwife

## 2024-01-22 VITALS — BP 123/75 | HR 72 | Wt 239.3 lb

## 2024-01-22 DIAGNOSIS — Z3A36 36 weeks gestation of pregnancy: Secondary | ICD-10-CM

## 2024-01-22 DIAGNOSIS — K219 Gastro-esophageal reflux disease without esophagitis: Secondary | ICD-10-CM

## 2024-01-22 DIAGNOSIS — N898 Other specified noninflammatory disorders of vagina: Secondary | ICD-10-CM | POA: Insufficient documentation

## 2024-01-22 DIAGNOSIS — O24410 Gestational diabetes mellitus in pregnancy, diet controlled: Secondary | ICD-10-CM | POA: Diagnosis not present

## 2024-01-22 DIAGNOSIS — O0990 Supervision of high risk pregnancy, unspecified, unspecified trimester: Secondary | ICD-10-CM

## 2024-01-22 DIAGNOSIS — O26893 Other specified pregnancy related conditions, third trimester: Secondary | ICD-10-CM | POA: Insufficient documentation

## 2024-01-22 DIAGNOSIS — O0993 Supervision of high risk pregnancy, unspecified, third trimester: Secondary | ICD-10-CM

## 2024-01-22 NOTE — Progress Notes (Signed)
 Pt has meter for glucose numbers

## 2024-01-22 NOTE — Addendum Note (Signed)
 Addended by: [name] CREDIT A on: 01/22/2024 12:49 PM   Modules accepted: Orders

## 2024-01-22 NOTE — Progress Notes (Addendum)
 "  PRENATAL VISIT NOTE  Subjective:  Kristen Dean is a 24 y.o. [id number] at [redacted]w[redacted]d being seen today for ongoing prenatal care.  She is currently monitored for the following issues for this high-risk pregnancy and has Vaginal irritation; Gastroesophageal reflux during pregnancy in third trimester, antepartum; Nausea and vomiting during pregnancy; Supervision of high risk pregnancy, antepartum; Obesity affecting pregnancy, antepartum; Gestational diabetes, diet controlled; Poor fetal growth affecting management of mother in third trimester; and [redacted] weeks gestation of pregnancy on their problem list.  Patient reports fatigue and heartburn.   . Vag. Bleeding: None.  Movement: Present. Denies leaking of fluid.   The following portions of the patient's history were reviewed and updated as appropriate: allergies, current medications, past family history, past medical history, past social history, past surgical history and problem list.   Objective:   Vitals:   01/22/24 1137  BP: 123/75  Pulse: 72  Weight: 239 lb 4.8 oz (108.5 kg)    Fetal Status:  Fetal Heart Rate (bpm): 141 Fundal Height: 36 cm Movement: Present    General: Alert, oriented and cooperative. Patient is in no acute distress.  Skin: Skin is warm and dry. No rash noted.   Cardiovascular: Normal heart rate noted  Respiratory: Normal respiratory effort, no problems with respiration noted  Abdomen: Soft, gravid, appropriate for gestational age.  Pain/Pressure: Present     Pelvic: Cervical exam deferred        Extremities: Normal range of motion.     Mental Status: Normal mood and affect. Normal behavior. Normal judgment and thought content.      11/28/2023   10:42 AM 08/01/2023    9:38 AM 06/19/2023   10:04 AM  Depression screen PHQ 2/9  Decreased Interest 0 0 0  Down, Depressed, Hopeless 0 0 0  PHQ - 2 Score 0 0 0  Altered sleeping  1 1  Tired, decreased energy  1 1  Change in appetite  2 0  Feeling bad or failure about  yourself   0 0  Trouble concentrating  0 1  Moving slowly or fidgety/restless  0 0  Suicidal thoughts  0 0  PHQ-9 Score  4  3      Data saved with a previous flowsheet row definition        08/01/2023    9:38 AM  GAD 7 : Generalized Anxiety Score  Nervous, Anxious, on Edge 0   Control/stop worrying 0   Worry too much - different things 0   Trouble relaxing 2   Restless 0   Easily annoyed or irritable 0   Afraid - awful might happen 0   Total GAD 7 Score 2     Data saved with a previous flowsheet row definition    Assessment and Plan:  Pregnancy: [id number] at [redacted]w[redacted]d 1. Supervision of high risk pregnancy, antepartum (Primary) - Doing well, feeling regular and vigorous fetal movement - Culture, beta strep (group b only)  2. [redacted] weeks gestation - Routine PNC, anticipatory guidance.  - CBC and iron panel today due to report of fatigeu.   3. Gastroesophageal reflux disease without esophagitis - Taking Pepcid  and Protonix   4. Vaginal discharge during pregnancy in third trimester - Swabs today. Reports initially had a sour odor, none now.  - Recently used terconazole , reports no improvement.  - No discharge noted by clinician today, will wait on lab results.   5. Diet controlled GDM - Reviewed meter today. Mostly in range. New log given.  -  Discussed diet and exercise.  - MFM appointment Friday and next OB appointment with me 1/27, plan IOL 39 weeks due to GDM. Await MFM recommendations.  Preterm labor symptoms and general obstetric precautions including but not limited to vaginal bleeding, contractions, leaking of fluid and fetal movement were reviewed in detail with the patient. Please refer to After Visit Summary for other counseling recommendations.   [facility] Guidelines for Antenatal Testing and Sonography                       Updated  10.09.25 with MFM Department  INDICATION Growth U/S Antenatal testing (weekly unless specified) DELIVERY RECOMMENDATION (GA)  Diabetes    A1 - good glycemic control     A1 - good glycemic control w/ LGA and/or polyhydramnioos      A2 - good glycemic control     A2  - poor glycemic control or poor compliance    (LGA, polyhydramnios, abnormal glucose)     Pregestational Type  I or II DM - good glycemic control    Pregestational Type  I or II DM - poor glycemic control or other comorbidities  Q4w  Q4w  Q4w  Q4w   Q4w  Q4w  None  32w  32w  28-32w per MFM   32w  28w - weekly 32w - 2x/wk  39.0-39.6w  37.0-39.0w  39-39.6w  37-38.0w or per MFM    39-39.6w  36-37.0w (consider admission)  CHTN   Group I: no meds, no preeclampsia, AGA, nml AFV    Group II: on meds, no preeclampsia, AGA, nml AFV   Group III: with superimposed preeclampsia WITHOUT severe features   Group IV: with superimposed preeclampsia WITH severe features  Q4w @ 24w  Q4w @ 24w  Q4w @ 24w  Q4w @ 24w  None  32w  At diagnosis per MFM  At diagnosis per MFM  38-39.6w  37-39w (or as per MFM with poor control)   37w or at diagnosis if later than 37  34w or sooner if indicated per MDM (consider admission)  Pre-eclampsia  GHTN or Preeclampsia without severe features   Preeclampsia with severe features   Q4w @ 24w or at dx  Q3-4w @ 24w or at dx  28w  Inpatient  37.0w or at diagnosis if later than 37  34.0w or sooner if indicated per MFM   IUGR (see separate protocol)   EFW < 10% +/- AC<10%, nml UA Dopplers, nml AFV, no other comorbidities      EFW < 3% +/- AC<3% OR abnormal UA dopplers OR comorbidities  Q3w  As per MFM algorithm  28w - weekly NST  As per MFM algorithm  38.0-39.0w  37w As per MFM algorithm   Multiple Gestation    DC/DA  Uncomplicated (nml  growth, AFV, no other comorbidities)  Complicated (FGR, discordance or other comorbidities)      MC/DA   Uncomplicated (nml  growth, AFV, no other comorbidities)  Complicated (TTTS, FGR, discordance or other comorbidities)    TRI/TRI  Concordant growth   Q4w Q 2-3 wks   Q4w with Q2w limited US  for TTTS screening  As per MFM   36w  28-30w   32w   As per MFM   38.0-38.6w 36.0-37.6w or as per MFM   37w (34-37.6w as per MFM) 32.0-35.0w as per MFM  35.0-36.6w (uncomplicated)  Advanced Maternal Age >= 36 y.o.  Q4w @ 32w 34w 39.0-40.0w  Oligohydramnios    MVP<2  Q4w @ 24w <32w - weekly  32w - 2x/week 36.0-37.6w or as per MFM  Polyhydramnios, mild (AFI 25-29 cm) Q4-6w @ 28w None 39.0-40w  Polyhydramnios, severe (AFI >=30 cm or SDP > 12 cm) Q4-6w @ 28w 32w 37.0-40.0w (37-38w for severe maternal discomfort)  Cholestasis   Bile acids <100   Bile acids >100 or significant maternal signs/symptoms  Q4w @ 24w Q4w @ 24w NST from 28-30w BPP +/-NST from 32w As per MFM  37w  36w (discuss with MFM)  History of Intrauterine Fetal Demise (>24 weeks)   Unexplained or with a recurrent risk factor   >35w prior IUFD with extreme maternal anxiety (? History of abruption)  Q4w @ 24w  32w  39.0w 37.0w  Chronic Renal Disease (Cr >=1.2, proteinuria) Q4w @ 24w As per MFM 37.0w or as per MFM  SLE (lupus) Q4w @ 24w 32w 39.0w (stable) or as per MFM if +flares/steroids  Antiphospholipid syndrome Q4w @ 24w 32w 39.0w  Sickle Cell Disease Q4w @ 24w 32w 39.0-40.0w (if stable); early term if with complications  Hypothyroidism (poorly controlled)  Q4-6w @ 28w 32w As per MFM  Hyperthyroidism (on medications)   Well controlled   Poor control Q4w @ 24w 32w  39.0w  37.0w or as per MFM  Major fetal anomaly Q4w @ 24w 36w or earlier as per MFM As per MFM  Fetal Trisomy 21 (confirmed on amnio or NIPS)  Q4w @ 24w 32w 39w (As per MFM)  Placenta Previa without hemorrhage Q4w @ 28w None 37.0w  Placenta Previa with hemorrhage Q4w @ 24w At diagnosis 34.0w-36.0w or as per MFM  Low lying placenta without hemorrhage (until ruled out) Q4-8w @ 28w None N/A  Chronic placental abruption Q4w @ 24w 28w or at diagnosis 37.0w or earlier as per  MFM  Vasa Previa (managed inpatient or outpatient) Q4w @ 24w 32w 34.0w-36.0w or as per MFM  Velamentous cord insertion Q4-6w @ 28w 36w N/A  Single umbilical artery (2VC) Q4-6w @ 28w None N/A  Marginal cord insertion 20w & 32w None N/A  Polysubstance Use Q4w @ 24w 36w As per MFM  Opiate Maintenance (stable) 32w & 36w None N/A  Tobacco/Nicotine use 32w None N/A  History of severe preeclampsia 32w & 36w None N/A  Abnormal serum markers  (PAPP-A <0.4, Inhibin A >2.0, AFP >3.0 ) Q4w @ 24w 32w As per MFM  Obesity (Pregravid BMI >35 and < 40 32w None 39-40.6w  Grade 3 Obesity (BMI > 40) 099.210, E66.01 Q4w @ 24w 34w 39-40.6w  IVF Pregnancy Q4-6w @ 28w 36w 39.0-40.6w  HIV  Viral load >1000 copies/mL  Viral load <1000 copies/ML w/ART compliant Q4-6w @ 28w None  38.0w (or at onset of labor) via cesarean + AZT 39.0-40.0w  Postdates - O48.0  (schedule testing at [redacted]w[redacted]d or later if possible) N/A 40w 40-41w        OTHER CLINICAL SCENARIOS AND RECOMMENDED DELIVERY TIMES *PPROM: [redacted]w[redacted]d-[redacted]w[redacted]d as per ACOG, patient should be counseled *Previous Myomectomy: If cavity entered, 37w, consider 36w if persistent pain/uterine contractions *Previous Classical Cesarean: 37w, consider 36w if persistent pain/uterine contractions *Previous Uterine Rupture:[redacted]w[redacted]d-[redacted]w[redacted]d, consider GA at time of rupture, may need to be done earlier *Placenta Accreta: [redacted]w[redacted]d-[redacted]w[redacted]d (with steroids) *For any planned delivery at less than 36w, a course of steroids should be given 2-3 days prior to planned delivery date.  *Gestational weeks: 36 means 36 0/7 weeks; number after decimal means days (e.g. 37.6 means [redacted]w[redacted]d)  **All [facility] guidelines are meant  to serve as a general approach for clinical practice and deviations from these guidelines are permitted when appropriate to meet the needs of the individual patient.  This approach serves a guideline for patient care within the department; however, it is not intended to be applied  universally to all patients.  Our physicians retain the discretion to modify or deviate from these guidelines based on the specific clinical circumstances and individual patient needs, ensuring that care is personalized and responsive to each situation.  Return in about 1 week (around 01/29/2024) for LOB.  Future Appointments  Date Time Provider Department Center  01/25/2024  3:15 PM [facility] PROVIDER 1 [facility]  01/25/2024  3:30 PM [facility] US1 [facility]  01/29/2024 11:15 AM [name] [name] DELENA EDDY [facility]  02/05/2024 11:15 AM Nicholaus Burnard HERO, MD [facility]  02/12/2024  9:55 AM Magali Barkley CROME, MD [facility]  02/19/2024 11:15 AM [name] [name] DELENA, CNM [facility]    [name] DELENA [name], CNM  "

## 2024-01-23 ENCOUNTER — Ambulatory Visit: Payer: Self-pay | Admitting: Certified Nurse Midwife

## 2024-01-23 LAB — CERVICOVAGINAL ANCILLARY ONLY
Bacterial Vaginitis (gardnerella): NEGATIVE
Candida Glabrata: NEGATIVE
Candida Vaginitis: NEGATIVE
Comment: NEGATIVE
Comment: NEGATIVE
Comment: NEGATIVE

## 2024-01-25 ENCOUNTER — Ambulatory Visit: Payer: MEDICAID

## 2024-01-25 ENCOUNTER — Ambulatory Visit: Payer: MEDICAID | Attending: Obstetrics and Gynecology | Admitting: Obstetrics and Gynecology

## 2024-01-25 VITALS — BP 117/66 | HR 86

## 2024-01-25 DIAGNOSIS — Z3A37 37 weeks gestation of pregnancy: Secondary | ICD-10-CM | POA: Insufficient documentation

## 2024-01-25 DIAGNOSIS — E669 Obesity, unspecified: Secondary | ICD-10-CM

## 2024-01-25 DIAGNOSIS — O24410 Gestational diabetes mellitus in pregnancy, diet controlled: Secondary | ICD-10-CM | POA: Insufficient documentation

## 2024-01-25 DIAGNOSIS — O99212 Obesity complicating pregnancy, second trimester: Secondary | ICD-10-CM

## 2024-01-25 DIAGNOSIS — Z362 Encounter for other antenatal screening follow-up: Secondary | ICD-10-CM | POA: Insufficient documentation

## 2024-01-25 DIAGNOSIS — O365930 Maternal care for other known or suspected poor fetal growth, third trimester, not applicable or unspecified: Secondary | ICD-10-CM | POA: Insufficient documentation

## 2024-01-25 DIAGNOSIS — O99213 Obesity complicating pregnancy, third trimester: Secondary | ICD-10-CM | POA: Diagnosis not present

## 2024-01-25 DIAGNOSIS — O0990 Supervision of high risk pregnancy, unspecified, unspecified trimester: Secondary | ICD-10-CM

## 2024-01-25 NOTE — Progress Notes (Signed)
 After review, MFM consult with provider is not indicated for today  Arna Ranks, MD 01/25/2024 4:06 PM  [facility]

## 2024-01-26 LAB — CULTURE, BETA STREP (GROUP B ONLY): Strep Gp B Culture: NEGATIVE

## 2024-01-29 ENCOUNTER — Ambulatory Visit: Payer: MEDICAID | Admitting: Certified Nurse Midwife

## 2024-01-29 ENCOUNTER — Other Ambulatory Visit: Payer: Self-pay

## 2024-01-29 VITALS — BP 114/70 | HR 72 | Wt 240.4 lb

## 2024-01-29 DIAGNOSIS — O219 Vomiting of pregnancy, unspecified: Secondary | ICD-10-CM

## 2024-01-29 DIAGNOSIS — O99213 Obesity complicating pregnancy, third trimester: Secondary | ICD-10-CM | POA: Diagnosis not present

## 2024-01-29 DIAGNOSIS — Z3A37 37 weeks gestation of pregnancy: Secondary | ICD-10-CM | POA: Diagnosis not present

## 2024-01-29 DIAGNOSIS — O24410 Gestational diabetes mellitus in pregnancy, diet controlled: Secondary | ICD-10-CM

## 2024-01-29 DIAGNOSIS — N9089 Other specified noninflammatory disorders of vulva and perineum: Secondary | ICD-10-CM | POA: Diagnosis not present

## 2024-01-29 DIAGNOSIS — O0990 Supervision of high risk pregnancy, unspecified, unspecified trimester: Secondary | ICD-10-CM

## 2024-01-29 DIAGNOSIS — K219 Gastro-esophageal reflux disease without esophagitis: Secondary | ICD-10-CM

## 2024-01-29 DIAGNOSIS — O99210 Obesity complicating pregnancy, unspecified trimester: Secondary | ICD-10-CM

## 2024-01-29 DIAGNOSIS — O0993 Supervision of high risk pregnancy, unspecified, third trimester: Secondary | ICD-10-CM

## 2024-01-29 MED ORDER — MUPIROCIN 2 % EX OINT: 1.0000 | TOPICAL_OINTMENT | Freq: Two times a day (BID) | CUTANEOUS | 0 refills | Status: AC

## 2024-01-29 NOTE — Progress Notes (Signed)
 "  PRENATAL VISIT NOTE  Subjective:  Kristen Dean is a 24 y.o. H4E7977 at [redacted]w[redacted]d being seen today for ongoing prenatal care.  She is currently monitored for the following issues for this high-risk pregnancy and has Vaginal irritation; Gastroesophageal reflux during pregnancy in third trimester, antepartum; Nausea and vomiting during pregnancy; Supervision of high risk pregnancy, antepartum; Obesity affecting pregnancy, antepartum; Gestational diabetes, diet controlled; Poor fetal growth affecting management of mother in third trimester; and [redacted] weeks gestation of pregnancy on their problem list.  Patient reports boil on perineum.  Contractions: Irritability. Vag. Bleeding: None.  Movement: Present. Denies leaking of fluid.   The following portions of the patient's history were reviewed and updated as appropriate: allergies, current medications, past family history, past medical history, past social history, past surgical history and problem list.   Objective:   Vitals:   01/29/24 1143  BP: 114/70  Pulse: 72  Weight: 240 lb 6.4 oz (109 kg)    Fetal Status:  Fetal Heart Rate (bpm): 145 Fundal Height: 38 cm Movement: Present Presentation: Vertex  General: Alert, oriented and cooperative. Patient is in no acute distress.  Skin: Skin is warm and dry. No rash noted.   Cardiovascular: Normal heart rate noted  Respiratory: Normal respiratory effort, no problems with respiration noted  Abdomen: Soft, gravid, appropriate for gestational age.  Pain/Pressure: Present     Pelvic: Cervical exam deferred        Extremities: Normal range of motion.     Mental Status: Normal mood and affect. Normal behavior. Normal judgment and thought content.      11/28/2023   10:42 AM 08/01/2023    9:38 AM 06/19/2023   10:04 AM  Depression screen PHQ 2/9  Decreased Interest 0 0 0  Down, Depressed, Hopeless 0 0 0  PHQ - 2 Score 0 0 0  Altered sleeping  1 1  Tired, decreased energy  1 1  Change in  appetite  2 0  Feeling bad or failure about yourself   0 0  Trouble concentrating  0 1  Moving slowly or fidgety/restless  0 0  Suicidal thoughts  0 0  PHQ-9 Score  4  3      Data saved with a previous flowsheet row definition        08/01/2023    9:38 AM  GAD 7 : Generalized Anxiety Score  Nervous, Anxious, on Edge 0   Control/stop worrying 0   Worry too much - different things 0   Trouble relaxing 2   Restless 0   Easily annoyed or irritable 0   Afraid - awful might happen 0   Total GAD 7 Score 2     Data saved with a previous flowsheet row definition    Assessment and Plan:  Pregnancy: [id number] at [redacted]w[redacted]d 1. Supervision of high risk pregnancy, antepartum (Primary) - Doing well, feeling regular and vigorous fetal movement  2. [redacted] weeks gestation of pregnancy - Routine PNC, anticipatory guidance.   3. Gastroesophageal reflux disease without esophagitis - Coping well.   4. Diet controlled gestational diabetes mellitus (GDM) in third trimester - Reviewed oral recall of CBGs, pt did not bring log to appointment. Asked to upload a picture to MyChart. Reports most in range with a few postprandial out of range.  - IOL scheduled 02/08/24 at [facility] for GDM with diet control.   5. Nausea and vomiting during pregnancy - Coping well.   6. Obesity affecting pregnancy, antepartum, unspecified obesity type -  IOL at [redacted] weeks gestation. 02/08/24 at [facility] (night of 02/07/24). IOL orders placed.   7. Cyst of perineum in female - Left perineum/buttock area - Slight induration, no signs of active infection - Suspect may be 2/2 ingrown hair. - Recommend warm soaks  - Mupirocin  ointment 2% BID topically PRN.   [facility] Guidelines for Antenatal Testing and Sonography                       Updated  10.09.25 with MFM Department  INDICATION Growth U/S Antenatal testing (weekly unless specified) DELIVERY RECOMMENDATION (GA)  Diabetes   A1 - good glycemic control     A1 - good glycemic control w/  LGA and/or polyhydramnioos      A2 - good glycemic control     A2  - poor glycemic control or poor compliance    (LGA, polyhydramnios, abnormal glucose)     Pregestational Type  I or II DM - good glycemic control    Pregestational Type  I or II DM - poor glycemic control or other comorbidities  Q4w  Q4w  Q4w  Q4w   Q4w  Q4w  None  32w  32w  28-32w per MFM   32w  28w - weekly 32w - 2x/wk  39.0-39.6w  37.0-39.0w  39-39.6w  37-38.0w or per MFM    39-39.6w  36-37.0w (consider admission)  CHTN   Group I: no meds, no preeclampsia, AGA, nml AFV    Group II: on meds, no preeclampsia, AGA, nml AFV   Group III: with superimposed preeclampsia WITHOUT severe features   Group IV: with superimposed preeclampsia WITH severe features  Q4w @ 24w  Q4w @ 24w  Q4w @ 24w  Q4w @ 24w  None  32w  At diagnosis per MFM  At diagnosis per MFM  38-39.6w  37-39w (or as per MFM with poor control)   37w or at diagnosis if later than 37  34w or sooner if indicated per MDM (consider admission)  Pre-eclampsia  GHTN or Preeclampsia without severe features   Preeclampsia with severe features   Q4w @ 24w or at dx  Q3-4w @ 24w or at dx  28w  Inpatient  37.0w or at diagnosis if later than 37  34.0w or sooner if indicated per MFM   IUGR (see separate protocol)   EFW < 10% +/- AC<10%, nml UA Dopplers, nml AFV, no other comorbidities      EFW < 3% +/- AC<3% OR abnormal UA dopplers OR comorbidities  Q3w  As per MFM algorithm  28w - weekly NST  As per MFM algorithm  38.0-39.0w  37w As per MFM algorithm   Multiple Gestation    DC/DA  Uncomplicated (nml  growth, AFV, no other comorbidities)  Complicated (FGR, discordance or other comorbidities)      MC/DA   Uncomplicated (nml  growth, AFV, no other comorbidities)  Complicated (TTTS, FGR, discordance or other comorbidities)    TRI/TRI Concordant growth   Q4w Q 2-3 wks   Q4w with Q2w limited US  for  TTTS screening  As per MFM   36w  28-30w   32w   As per MFM   38.0-38.6w 36.0-37.6w or as per MFM   37w (34-37.6w as per MFM) 32.0-35.0w as per MFM  35.0-36.6w (uncomplicated)  Advanced Maternal Age >= 75 y.o.  Q4w @ 32w 34w 39.0-40.0w  Oligohydramnios    MVP<2    Q4w @ 24w <32w - weekly  32w - 2x/week  36.0-37.6w or as per MFM  Polyhydramnios, mild (AFI 25-29 cm) Q4-6w @ 28w None 39.0-40w  Polyhydramnios, severe (AFI >=30 cm or SDP > 12 cm) Q4-6w @ 28w 32w 37.0-40.0w (37-38w for severe maternal discomfort)  Cholestasis   Bile acids <100   Bile acids >100 or significant maternal signs/symptoms  Q4w @ 24w Q4w @ 24w NST from 28-30w BPP +/-NST from 32w As per MFM  37w  36w (discuss with MFM)  History of Intrauterine Fetal Demise (>24 weeks)   Unexplained or with a recurrent risk factor   >35w prior IUFD with extreme maternal anxiety (? History of abruption)  Q4w @ 24w  32w  39.0w 37.0w  Chronic Renal Disease (Cr >=1.2, proteinuria) Q4w @ 24w As per MFM 37.0w or as per MFM  SLE (lupus) Q4w @ 24w 32w 39.0w (stable) or as per MFM if +flares/steroids  Antiphospholipid syndrome Q4w @ 24w 32w 39.0w  Sickle Cell Disease Q4w @ 24w 32w 39.0-40.0w (if stable); early term if with complications  Hypothyroidism (poorly controlled)  Q4-6w @ 28w 32w As per MFM  Hyperthyroidism (on medications)   Well controlled   Poor control Q4w @ 24w 32w  39.0w  37.0w or as per MFM  Major fetal anomaly Q4w @ 24w 36w or earlier as per MFM As per MFM  Fetal Trisomy 21 (confirmed on amnio or NIPS)  Q4w @ 24w 32w 39w (As per MFM)  Placenta Previa without hemorrhage Q4w @ 28w None 37.0w  Placenta Previa with hemorrhage Q4w @ 24w At diagnosis 34.0w-36.0w or as per MFM  Low lying placenta without hemorrhage (until ruled out) Q4-8w @ 28w None N/A  Chronic placental abruption Q4w @ 24w 28w or at diagnosis 37.0w or earlier as per MFM  Vasa Previa (managed inpatient or outpatient) Q4w @ 24w 32w  34.0w-36.0w or as per MFM  Velamentous cord insertion Q4-6w @ 28w 36w N/A  Single umbilical artery (2VC) Q4-6w @ 28w None N/A  Marginal cord insertion 20w & 32w None N/A  Polysubstance Use Q4w @ 24w 36w As per MFM  Opiate Maintenance (stable) 32w & 36w None N/A  Tobacco/Nicotine use 32w None N/A  History of severe preeclampsia 32w & 36w None N/A  Abnormal serum markers  (PAPP-A <0.4, Inhibin A >2.0, AFP >3.0 ) Q4w @ 24w 32w As per MFM  Obesity (Pregravid BMI >35 and < 40 32w None 39-40.6w  Grade 3 Obesity (BMI > 40) 099.210, E66.01 Q4w @ 24w 34w 39-40.6w  IVF Pregnancy Q4-6w @ 28w 36w 39.0-40.6w  HIV  Viral load >1000 copies/mL  Viral load <1000 copies/ML w/ART compliant Q4-6w @ 28w None  38.0w (or at onset of labor) via cesarean + AZT 39.0-40.0w  Postdates - O48.0  (schedule testing at [redacted]w[redacted]d or later if possible) N/A 40w 40-41w        OTHER CLINICAL SCENARIOS AND RECOMMENDED DELIVERY TIMES *PPROM: [redacted]w[redacted]d-[redacted]w[redacted]d as per ACOG, patient should be counseled *Previous Myomectomy: If cavity entered, 37w, consider 36w if persistent pain/uterine contractions *Previous Classical Cesarean: 37w, consider 36w if persistent pain/uterine contractions *Previous Uterine Rupture:[redacted]w[redacted]d-[redacted]w[redacted]d, consider GA at time of rupture, may need to be done earlier *Placenta Accreta: [redacted]w[redacted]d-[redacted]w[redacted]d (with steroids) *For any planned delivery at less than 36w, a course of steroids should be given 2-3 days prior to planned delivery date.  *Gestational weeks: 36 means 36 0/7 weeks; number after decimal means days (e.g. 37.6 means [redacted]w[redacted]d)  **All [facility] guidelines are meant to serve as a general approach for clinical practice and  deviations from these guidelines are permitted when appropriate to meet the needs of the individual patient.  This approach serves a guideline for patient care within the department; however, it is not intended to be applied universally to all patients.  Our physicians retain the discretion to  modify or deviate from these guidelines based on the specific clinical circumstances and individual patient needs, ensuring that care is personalized and responsive to each situation.   Term labor symptoms and general obstetric precautions including but not limited to vaginal bleeding, contractions, leaking of fluid and fetal movement were reviewed in detail with the patient. Please refer to After Visit Summary for other counseling recommendations.   Return in about 1 week (around 02/05/2024).  Future Appointments  Date Time Provider Department Center  02/05/2024 11:15 AM Nicholaus Burnard HERO, MD [facility]  02/12/2024  9:55 AM Magali Barkley CROME, MD [facility]  02/19/2024 11:15 AM [name] [name] LABOR, CNM [facility]    [name] LABOR [name], CNM  "

## 2024-01-30 LAB — CBC
Hematocrit: 34.9 % (ref 34.0–46.6)
Hemoglobin: 11.5 g/dL (ref 11.1–15.9)
MCH: 29.3 pg (ref 26.6–33.0)
MCHC: 33 g/dL (ref 31.5–35.7)
MCV: 89 fL (ref 79–97)
Platelets: 244 10*3/uL (ref 150–450)
RBC: 3.92 x10E6/uL (ref 3.77–5.28)
RDW: 13.4 % (ref 11.7–15.4)
WBC: 10.2 10*3/uL (ref 3.4–10.8)

## 2024-01-30 LAB — IRON AND TIBC

## 2024-01-30 LAB — IRON,TIBC AND FERRITIN PANEL
Ferritin: 11 ng/mL — ABNORMAL LOW (ref 15–150)
Iron Saturation: 8 % — CL (ref 15–55)
Iron: 44 ug/dL (ref 27–159)
Total Iron Binding Capacity: 523 ug/dL — ABNORMAL HIGH (ref 250–450)
UIBC: 479 ug/dL — ABNORMAL HIGH (ref 131–425)

## 2024-02-01 ENCOUNTER — Telehealth (HOSPITAL_COMMUNITY): Payer: Self-pay | Admitting: *Deleted

## 2024-02-01 NOTE — Telephone Encounter (Signed)
 Preadmission screen

## 2024-02-04 ENCOUNTER — Telehealth (HOSPITAL_COMMUNITY): Payer: Self-pay | Admitting: *Deleted

## 2024-02-04 ENCOUNTER — Encounter (HOSPITAL_COMMUNITY): Payer: Self-pay | Admitting: *Deleted

## 2024-02-04 NOTE — Telephone Encounter (Signed)
 Preadmission screen

## 2024-02-05 ENCOUNTER — Encounter: Payer: MEDICAID | Admitting: Obstetrics and Gynecology

## 2024-02-06 ENCOUNTER — Telehealth: Payer: MEDICAID | Admitting: Family Medicine

## 2024-02-06 DIAGNOSIS — O219 Vomiting of pregnancy, unspecified: Secondary | ICD-10-CM

## 2024-02-06 DIAGNOSIS — O0990 Supervision of high risk pregnancy, unspecified, unspecified trimester: Secondary | ICD-10-CM

## 2024-02-06 DIAGNOSIS — O99613 Diseases of the digestive system complicating pregnancy, third trimester: Secondary | ICD-10-CM

## 2024-02-06 DIAGNOSIS — J069 Acute upper respiratory infection, unspecified: Secondary | ICD-10-CM

## 2024-02-06 DIAGNOSIS — O365930 Maternal care for other known or suspected poor fetal growth, third trimester, not applicable or unspecified: Secondary | ICD-10-CM

## 2024-02-06 DIAGNOSIS — Z3A38 38 weeks gestation of pregnancy: Secondary | ICD-10-CM

## 2024-02-06 DIAGNOSIS — E611 Iron deficiency: Secondary | ICD-10-CM

## 2024-02-06 DIAGNOSIS — O24410 Gestational diabetes mellitus in pregnancy, diet controlled: Secondary | ICD-10-CM

## 2024-02-06 MED ORDER — FERROUS SULFATE 325 (65 FE) MG PO TABS: 325.0000 mg | ORAL_TABLET | ORAL | 3 refills | Status: AC

## 2024-02-06 NOTE — Patient Instructions (Signed)
 For cold and allergy symptoms  You may take Mucinex, (Allegra, Claritin, Zyrtec, Xyzal, Clarinex-any one of these--they are the same class--same as Benadryl), Sudafed (must be behind the counter, should not be phenylephrine), saline or steroid (Nasacort, Nasonex and Flonase) nasal sprays.

## 2024-02-06 NOTE — Progress Notes (Signed)
 "  OBSTETRICS PRENATAL VIRTUAL VISIT ENCOUNTER NOTE  Provider location: Center for [facility]   Patient location: Home  I connected with Delara Brents on 02/06/24 at  9:15 AM EST by MyChart Video Encounter and verified that I am speaking with the correct person using two identifiers. I discussed the limitations, risks, security and privacy concerns of performing an evaluation and management service virtually and the availability of in person appointments. I also discussed with the patient that there may be a patient responsible charge related to this service. The patient expressed understanding and agreed to proceed. Subjective:  Rosina Wyndham is a 24 y.o. [phone] at [redacted]w[redacted]d being seen today for ongoing prenatal care.  She is currently monitored for the following issues for this high-risk pregnancy and has Vaginal irritation; Gastroesophageal reflux during pregnancy in third trimester, antepartum; Nausea and vomiting during pregnancy; Supervision of high risk pregnancy, antepartum; Obesity affecting pregnancy, antepartum; Gestational diabetes, diet controlled; and Poor fetal growth affecting management of mother in third trimester on their problem list.  Patient reports no complaints.  Contractions: Not present. Vag. Bleeding: None.  Movement: Present. Denies any leaking of fluid.   The following portions of the patient's history were reviewed and updated as appropriate: allergies, current medications, past family history, past medical history, past social history, past surgical history and problem list.   Objective:    There were no vitals filed for this visit.  Fetal Status:      Movement: Present    General: Alert, oriented and cooperative. Patient is in no acute distress.  Respiratory: Normal respiratory effort, no problems with respiration noted  Mental Status: Normal mood and affect. Normal behavior. Normal judgment and thought content.  Rest of  physical exam deferred due to type of encounter  Imaging: US  MFM OB FOLLOW UP Result Date: 01/25/2024 ----------------------------------------------------------------------  OBSTETRICS REPORT                       (Signed Final 01/25/2024 04:05 pm) ---------------------------------------------------------------------- Patient Info  ID #:       [id number]                          D.O.B.:  02-Jul-2000 (24 yrs)(F)  Name:       EDSEL MORRIS              Visit Date: 01/25/2024 03:12 pm ---------------------------------------------------------------------- Performed By  Attending:        Fredia Fresh MD        Ref. Address:     [address]                                                             [place], [place]                                                             [address]  Performed By:     Emmaline Forte         [facility]  Referred By:      [facility] ---------------------------------------------------------------------- Orders  #  Description                           Code        Ordered By  1  US  MFM OB FOLLOW UP                   76816.01    DELORA SMALLER ----------------------------------------------------------------------  #  Order #                     Accession #                Episode #  1  [id number]                   [id number]                 [id number] ---------------------------------------------------------------------- Indications  Maternal care for known or suspected poor      O36.5930  fetal growth, third trimester, single fetus  IUGR (RESOLVED)  Gestational diabetes in pregnancy, diet        O24.410  controlled  Obesity complicating pregnancy, third          O99.212  trimester  Low risk NIPS female - NEG Horizon   Encounter for other antenatal screening        Z36.2  follow-up  [redacted] weeks gestation of pregnancy                Z3A.37 ---------------------------------------------------------------------- Vital Signs  BP:          117/66 ---------------------------------------------------------------------- Fetal Evaluation  Num Of Fetuses:         1  Fetal Heart Rate(bpm):  140  Cardiac Activity:       Observed  Presentation:           Cephalic  Placenta:               Posterior  P. Cord Insertion:      Previously seen  Amniotic Fluid  AFI FV:      Within normal limits  AFI Sum(cm)     %Tile       Largest Pocket(cm)  17.28           66          8.76  RUQ(cm)       RLQ(cm)       LUQ(cm)        LLQ(cm)  0             2.44          6.08           8.76 ---------------------------------------------------------------------- Biophysical Evaluation  Amniotic F.V:   Within normal limits       F. Tone:        Observed  F. Movement:    Observed                   Score:          8/8  F. Breathing:   Observed ---------------------------------------------------------------------- Biometry  BPD:      88.3  mm     G. Age:  35w 5d         29  %    CI:        75.19   %    70 - 86                                                          FL/HC:      19.7   %    20.8 - 22.6  HC:       323   mm     G. Age:  36w 4d         15  %    HC/AC:      1.00        0.92 - 1.05  AC:      322.8  mm     G. Age:  36w 1d         40  %    FL/BPD:     72.0   %    71 - 87  FL:       63.6  mm     G. Age:  32w 6d        < 1  %    FL/AC:      19.7   %    20 - 24  LV:        4.5  mm  Est. FW:    2649  gm    5 lb 13 oz      16  %  Est. FW at 39 Wks:       3004  gm    6 lb 10 oz ---------------------------------------------------------------------- OB History  Gravidity:    5         Term:   2         SAB:   2  Living:       2 ---------------------------------------------------------------------- Gestational Age  LMP:           41w 6d        Date:  04/07/23                    EDD:   01/12/24  U/S Today:     35w 2d  EDD:   02/27/24  Best:          37w 0d     Det. By:  Early Ultrasound         EDD:   02/15/24                                      (06/28/23) ---------------------------------------------------------------------- Anatomy  Cranium:               Previously seen        Aortic Arch:            Previously seen  Cavum:                 Previously seen        Ductal Arch:            Previously seen  Ventricles:            Appears normal         Diaphragm:              Appears normal  Choroid Plexus:        Previously seen        Stomach:                Appears normal, left                                                                        sided  Cerebellum:            Previously seen        Abdomen:                Previously seen  Posterior Fossa:       Previously seen        Abdominal Wall:         Previously seen  Face:                  Orbits and profile     Cord Vessels:           Previously seen                         previously seen  Lips:                  Previously seen        Kidneys:                Appear normal  Thoracic:              Previously seen        Bladder:                Appears normal  Heart:                 Appears normal         Spine:                  Previously seen                         (  4CH, axis, and                         situs)  RVOT:                  Previously seen        Upper Extremities:      Previously seen  LVOT:                  Previously seen        Lower Extremities:      Previously seen  Other:  Fetal anatomic survey complete on prior scans. ---------------------------------------------------------------------- Cervix Uterus Adnexa  Cervix  Not visualized (advanced GA >24wks)  Uterus  No abnormality visualized.  Right Ovary  Size(cm)     3.08   x   2.11   x  2.17      Vol(ml): 7.38  Simple cyst, measuring 2.3 x 1.5 x 2.3 cm  Left Ovary  Not visualized.  Cul De Sac  No free fluid seen.  Adnexa  No  abnormality visualized ---------------------------------------------------------------------- Impression  Gestational diabetes.  Reportedly well-controlled on diet.  Ultrasound  Normal fetal growth and amniotic fluid.  Femur length  measurement is at the 1st percentile but good interval growth  is seen.  Cephalic presentation.  I reassured the patient of the findings. ---------------------------------------------------------------------- Recommendations  -No follow-up appointments were made . ----------------------------------------------------------------------                 Fredia Fresh, MD Electronically Signed Final Report   01/25/2024 04:05 pm ----------------------------------------------------------------------    Assessment and Plan:  Pregnancy: [id number] at [redacted]w[redacted]d 1. [redacted] weeks gestation of pregnancy (Primary)   2. Gastroesophageal reflux during pregnancy in third trimester, antepartum On Pepcid   3. Nausea and vomiting during pregnancy On Scopolamine   4. Diet controlled gestational diabetes mellitus (GDM) in third trimester Not checking CBGs, due to illness Last check was 130, diet indiscretion EFW 16% For IOL 2/6 midnight Continue diet  5. Supervision of high risk pregnancy, antepartum Continue prenatal care.  6. Poor fetal growth affecting management of mother in third trimester, single or unspecified fetus @ 16% For delivery @ 39 weeks  7. Iron deficiency On last check. Hgb is > 11 Begin po iron every other day - ferrous sulfate  325 (65 FE) MG tablet; Take 1 tablet (325 mg total) by mouth every other day. Take on M, W, F  Dispense: 45 tablet; Refill: 3  8. URI Given list of OTC meds ok in pregnancy  Term labor symptoms and general obstetric precautions including but not limited to vaginal bleeding, contractions, leaking of fluid and fetal movement were reviewed in detail with the patient. I discussed the assessment and treatment plan with the patient. The patient was  provided an opportunity to ask questions and all were answered. The patient agreed with the plan and demonstrated an understanding of the instructions. The patient was advised to call back or seek an in-person office evaluation/go to [facility] at [facility] for any urgent or concerning symptoms. Please refer to After Visit Summary for other counseling recommendations.   I provided 7 minutes of face-to-face time during this encounter.  Return in 5 weeks (on 03/12/2024) for pp check.  Future Appointments  Date Time Provider Department Center  02/08/2024 12:00 AM [facility] SCHED ROOM [facility] None  02/12/2024  9:55 AM Cashion, Barkley CROME, MD [facility]  02/19/2024 11:15 AM [name], [name] LABOR, CNM  [facility]    Glenys GORMAN Birk, MD Center for [employer], [facility]  "

## 2024-02-08 ENCOUNTER — Inpatient Hospital Stay (HOSPITAL_COMMUNITY): Payer: MEDICAID | Admitting: Anesthesiology

## 2024-02-08 ENCOUNTER — Encounter (HOSPITAL_COMMUNITY): Payer: Self-pay | Admitting: Family Medicine

## 2024-02-08 ENCOUNTER — Other Ambulatory Visit: Payer: Self-pay

## 2024-02-08 ENCOUNTER — Inpatient Hospital Stay (HOSPITAL_COMMUNITY)
Admission: AD | Admit: 2024-02-08 | Payer: MEDICAID | Source: Home / Self Care | Attending: Obstetrics and Gynecology | Admitting: Obstetrics and Gynecology

## 2024-02-08 ENCOUNTER — Inpatient Hospital Stay (HOSPITAL_COMMUNITY): Payer: MEDICAID

## 2024-02-08 DIAGNOSIS — Z3490 Encounter for supervision of normal pregnancy, unspecified, unspecified trimester: Principal | ICD-10-CM | POA: Diagnosis present

## 2024-02-08 LAB — COMPREHENSIVE METABOLIC PANEL WITH GFR
ALT: 12 U/L (ref 0–44)
AST: 25 U/L (ref 15–41)
Albumin: 3.6 g/dL (ref 3.5–5.0)
Alkaline Phosphatase: 82 U/L (ref 38–126)
Anion gap: 13 (ref 5–15)
BUN: 6 mg/dL (ref 6–20)
CO2: 18 mmol/L — ABNORMAL LOW (ref 22–32)
Calcium: 9.6 mg/dL (ref 8.9–10.3)
Chloride: 103 mmol/L (ref 98–111)
Creatinine, Ser: 0.67 mg/dL (ref 0.44–1.00)
GFR, Estimated: >60 mL/min
Glucose, Bld: 101 mg/dL — ABNORMAL HIGH (ref 70–99)
Potassium: 4.1 mmol/L (ref 3.5–5.1)
Sodium: 134 mmol/L — ABNORMAL LOW (ref 135–145)
Total Bilirubin: 0.2 mg/dL (ref 0.0–1.2)
Total Protein: 7.1 g/dL (ref 6.5–8.1)

## 2024-02-08 LAB — CBC
HCT: 33.5 % — ABNORMAL LOW (ref 36.0–46.0)
Hemoglobin: 11.3 g/dL — ABNORMAL LOW (ref 12.0–15.0)
MCH: 29.4 pg (ref 26.0–34.0)
MCHC: 33.7 g/dL (ref 30.0–36.0)
MCV: 87.2 fL (ref 80.0–100.0)
Platelets: 293 10*3/uL (ref 150–400)
RBC: 3.84 MIL/uL — ABNORMAL LOW (ref 3.87–5.11)
RDW: 14.5 % (ref 11.5–15.5)
WBC: 11.2 10*3/uL — ABNORMAL HIGH (ref 4.0–10.5)
nRBC: 0 % (ref 0.0–0.2)

## 2024-02-08 LAB — TYPE AND SCREEN
ABO/RH(D): A POS
Antibody Screen: NEGATIVE

## 2024-02-08 LAB — GLUCOSE, CAPILLARY
Glucose-Capillary: 97 mg/dL (ref 70–99)
Glucose-Capillary: 92 mg/dL (ref 70–99)
Glucose-Capillary: 85 mg/dL (ref 70–99)

## 2024-02-08 LAB — SYPHILIS: RPR W/REFLEX TO RPR TITER AND TREPONEMAL ANTIBODIES, TRADITIONAL SCREENING AND DIAGNOSIS ALGORITHM: RPR Ser Ql: NONREACTIVE

## 2024-02-08 MED ORDER — LACTATED RINGERS IV SOLN
500.0000 mL | Freq: Once | INTRAVENOUS | Status: AC
  Administered 2024-02-08: 500 mL via INTRAVENOUS

## 2024-02-08 MED ORDER — ONDANSETRON HCL 4 MG/2ML IJ SOLN: 4.0000 mg | INTRAMUSCULAR | Status: AC | PRN

## 2024-02-08 MED ORDER — OXYCODONE-ACETAMINOPHEN 5-325 MG PO TABS: 2.0000 | ORAL_TABLET | ORAL | Status: DC | PRN

## 2024-02-08 MED ORDER — OXYTOCIN-SODIUM CHLORIDE 30-0.9 UT/500ML-% IV SOLN
1.0000 m[IU]/min | INTRAVENOUS | Status: DC
  Administered 2024-02-08: 1 m[IU]/min via INTRAVENOUS
  Filled 2024-02-08: qty 500

## 2024-02-08 MED ORDER — OXYTOCIN-SODIUM CHLORIDE 30-0.9 UT/500ML-% IV SOLN: 1.0000 m[IU]/min | INTRAVENOUS | Status: DC

## 2024-02-08 MED ORDER — DIPHENHYDRAMINE HCL 25 MG PO CAPS: 25.0000 mg | ORAL_CAPSULE | Freq: Four times a day (QID) | ORAL | Status: AC | PRN

## 2024-02-08 MED ORDER — MAGNESIUM HYDROXIDE 400 MG/5ML PO SUSP: 30.0000 mL | ORAL | Status: AC | PRN

## 2024-02-08 MED ORDER — TRANEXAMIC ACID-NACL 1000-0.7 MG/100ML-% IV SOLN
INTRAVENOUS | Status: AC
  Administered 2024-02-08: 1000 mg via INTRAVENOUS
  Filled 2024-02-08: qty 100

## 2024-02-08 MED ORDER — ACETAMINOPHEN 325 MG PO TABS: 650.0000 mg | ORAL_TABLET | ORAL | Status: DC | PRN

## 2024-02-08 MED ORDER — TETANUS-DIPHTH-ACELL PERTUSSIS 5-2-15.5 LF-MCG/0.5 IM SUSP: 0.5000 mL | Freq: Once | INTRAMUSCULAR | Status: AC

## 2024-02-08 MED ORDER — FAMOTIDINE 20 MG PO TABS: 20.0000 mg | ORAL_TABLET | Freq: Two times a day (BID) | ORAL | Status: AC | PRN

## 2024-02-08 MED ORDER — LIDOCAINE HCL (PF) 1 % IJ SOLN: 30.0000 mL | INTRAMUSCULAR | Status: DC | PRN

## 2024-02-08 MED ORDER — PHENYLEPHRINE 80 MCG/ML (10ML) SYRINGE FOR IV PUSH (FOR BLOOD PRESSURE SUPPORT): 80.0000 ug | PREFILLED_SYRINGE | INTRAVENOUS | Status: DC | PRN

## 2024-02-08 MED ORDER — SIMETHICONE 80 MG PO CHEW: 80.0000 mg | CHEWABLE_TABLET | ORAL | Status: AC | PRN

## 2024-02-08 MED ORDER — LACTATED RINGERS IV SOLN: INTRAVENOUS | Status: DC

## 2024-02-08 MED ORDER — WITCH HAZEL-GLYCERIN EX PADS: 1.0000 | MEDICATED_PAD | CUTANEOUS | Status: AC | PRN

## 2024-02-08 MED ORDER — ACETAMINOPHEN 325 MG PO TABS: 650.0000 mg | ORAL_TABLET | ORAL | Status: AC | PRN

## 2024-02-08 MED ORDER — TRANEXAMIC ACID-NACL 1000-0.7 MG/100ML-% IV SOLN: 1000.0000 mg | INTRAVENOUS | Status: AC

## 2024-02-08 MED ORDER — HYDROXYZINE HCL 50 MG PO TABS: 50.0000 mg | ORAL_TABLET | Freq: Four times a day (QID) | ORAL | Status: DC | PRN

## 2024-02-08 MED ORDER — LACTATED RINGERS IV SOLN: 500.0000 mL | INTRAVENOUS | Status: DC | PRN

## 2024-02-08 MED ORDER — FENTANYL-BUPIVACAINE-NACL 0.5-0.125-0.9 MG/250ML-% EP SOLN
12.0000 mL/h | EPIDURAL | Status: DC | PRN
  Administered 2024-02-08: 12 mL/h via EPIDURAL
  Filled 2024-02-08: qty 250

## 2024-02-08 MED ORDER — EPHEDRINE 5 MG/ML INJ: 10.0000 mg | INTRAVENOUS | Status: DC | PRN

## 2024-02-08 MED ORDER — FENTANYL CITRATE (PF) 100 MCG/2ML IJ SOLN: 50.0000 ug | INTRAMUSCULAR | Status: DC | PRN

## 2024-02-08 MED ORDER — PRENATAL MULTIVITAMIN CH: 1.0000 | ORAL_TABLET | Freq: Every day | ORAL | Status: AC

## 2024-02-08 MED ORDER — MEASLES, MUMPS & RUBELLA VAC ~~LOC~~ SUSR: 0.5000 mL | Freq: Once | SUBCUTANEOUS | Status: AC

## 2024-02-08 MED ORDER — OXYTOCIN-SODIUM CHLORIDE 30-0.9 UT/500ML-% IV SOLN
2.5000 [IU]/h | INTRAVENOUS | Status: DC
  Administered 2024-02-08: 2.5 [IU]/h via INTRAVENOUS

## 2024-02-08 MED ORDER — OXYTOCIN BOLUS FROM INFUSION
333.0000 mL | Freq: Once | INTRAVENOUS | Status: AC
  Administered 2024-02-08: 333 mL via INTRAVENOUS

## 2024-02-08 MED ORDER — SOD CITRATE-CITRIC ACID 500-334 MG/5ML PO SOLN: 30.0000 mL | ORAL | Status: DC | PRN

## 2024-02-08 MED ORDER — DIBUCAINE (PERIANAL) 1 % EX OINT: 1.0000 | TOPICAL_OINTMENT | CUTANEOUS | Status: AC | PRN

## 2024-02-08 MED ORDER — SODIUM CHLORIDE 0.9% FLUSH: 3.0000 mL | Freq: Two times a day (BID) | INTRAVENOUS | Status: DC

## 2024-02-08 MED ORDER — ONDANSETRON HCL 4 MG/2ML IJ SOLN: 4.0000 mg | Freq: Four times a day (QID) | INTRAMUSCULAR | Status: DC | PRN

## 2024-02-08 MED ORDER — MISOPROSTOL 25 MCG QUARTER TABLET
25.0000 ug | ORAL_TABLET | Freq: Once | ORAL | Status: AC
  Administered 2024-02-08: 25 ug via VAGINAL
  Filled 2024-02-08: qty 1

## 2024-02-08 MED ORDER — BENZOCAINE-MENTHOL 20-0.5 % EX AERO
1.0000 | INHALATION_SPRAY | CUTANEOUS | Status: AC | PRN
  Administered 2024-02-08: 1 via TOPICAL
  Filled 2024-02-08: qty 56

## 2024-02-08 MED ORDER — TERBUTALINE SULFATE 1 MG/ML IJ SOLN: 0.2500 mg | Freq: Once | INTRAMUSCULAR | Status: DC | PRN

## 2024-02-08 MED ORDER — SODIUM CHLORIDE 0.9 % IV SOLN: 250.0000 mL | INTRAVENOUS | Status: DC | PRN

## 2024-02-08 MED ORDER — IBUPROFEN 600 MG PO TABS
600.0000 mg | ORAL_TABLET | Freq: Four times a day (QID) | ORAL | Status: AC
  Administered 2024-02-08 (×2): 600 mg via ORAL
  Filled 2024-02-08 (×2): qty 1

## 2024-02-08 MED ORDER — MISOPROSTOL 50MCG HALF TABLET
50.0000 ug | ORAL_TABLET | Freq: Once | ORAL | Status: AC
  Administered 2024-02-08: 50 ug via ORAL
  Filled 2024-02-08: qty 1

## 2024-02-08 MED ORDER — ONDANSETRON HCL 4 MG PO TABS: 4.0000 mg | ORAL_TABLET | ORAL | Status: AC | PRN

## 2024-02-08 MED ORDER — OXYCODONE HCL 5 MG PO TABS: 10.0000 mg | ORAL_TABLET | ORAL | Status: AC | PRN

## 2024-02-08 MED ORDER — LIDOCAINE HCL (PF) 1 % IJ SOLN
INTRAMUSCULAR | Status: DC | PRN
  Administered 2024-02-08 (×2): 4 mL via EPIDURAL

## 2024-02-08 MED ORDER — DIPHENHYDRAMINE HCL 50 MG/ML IJ SOLN: 12.5000 mg | INTRAMUSCULAR | Status: DC | PRN

## 2024-02-08 MED ORDER — OXYCODONE HCL 5 MG PO TABS: 5.0000 mg | ORAL_TABLET | ORAL | Status: AC | PRN

## 2024-02-08 MED ORDER — SODIUM CHLORIDE 0.9% FLUSH: 3.0000 mL | INTRAVENOUS | Status: DC | PRN

## 2024-02-08 MED ORDER — COCONUT OIL OIL: 1.0000 | TOPICAL_OIL | Status: AC | PRN

## 2024-02-08 MED ORDER — OXYCODONE-ACETAMINOPHEN 5-325 MG PO TABS: 1.0000 | ORAL_TABLET | ORAL | Status: DC | PRN

## 2024-02-08 NOTE — Progress Notes (Signed)
 Patient ID: Kristen Dean, female   DOB: [birth year], 24 y.o.   MRN: [id number]  Blood pressure (!) 108/50, pulse 72, temperature 97.6 F (36.4 C), temperature source Oral, height 5' 8 (1.727 m), weight 107.8 kg, last menstrual period 04/07/2023.  FHT: baseline 150/moderate variability/-accels/recurrent late decels Toco: difficult to trace  Dilation: 3 Effacement (%): 50 Cervical Position: Posterior Station: -3 Presentation: Vertex (verified by US ) Exam by:: Dr. Laban  Seen at bedside due to recurrent decelerations. On arrival to the room, LR bolus running and patient laying on left side with resolution of decel. SVE completed, making quick change. Discussed plan of care for fetal recovery. After stable, can discuss movement including being on the birthing ball in the room. All questions answered.  [name], MD

## 2024-02-08 NOTE — Anesthesia Preprocedure Evaluation (Signed)
"                                    Anesthesia Evaluation  Patient identified by MRN, date of birth, ID band Patient awake    Reviewed: Allergy & Precautions, Patient's Chart, lab work & pertinent test results  History of Anesthesia Complications Negative for: history of anesthetic complications  Airway Mallampati: II  TM Distance: >3 FB Neck ROM: Full    Dental no notable dental hx.    Pulmonary neg pulmonary ROS   Pulmonary exam normal        Cardiovascular negative cardio ROS Normal cardiovascular exam     Neuro/Psych negative neurological ROS     GI/Hepatic Neg liver ROS,GERD  ,,  Endo/Other  diabetes, Gestational    Renal/GU negative Renal ROS     Musculoskeletal negative musculoskeletal ROS (+)    Abdominal   Peds  Hematology negative hematology ROS (+)   Anesthesia Other Findings   Reproductive/Obstetrics (+) Pregnancy                              Anesthesia Physical Anesthesia Plan  ASA: 2  Anesthesia Plan: Epidural   Post-op Pain Management:    Induction:   PONV Risk Score and Plan: Treatment may vary due to age or medical condition  Airway Management Planned: Natural Airway  Additional Equipment: Fetal Monitoring  Intra-op Plan:   Post-operative Plan:   Informed Consent: I have reviewed the patients History and Physical, chart, labs and discussed the procedure including the risks, benefits and alternatives for the proposed anesthesia with the patient or authorized representative who has indicated his/her understanding and acceptance.       Plan Discussed with:   Anesthesia Plan Comments:          Anesthesia Quick Evaluation  "

## 2024-02-08 NOTE — Discharge Summary (Shared)
 "    Postpartum Discharge Summary  Date of Service updated***     Patient Name: Kristen Dean DOB: [birth year] MRN: [id number]  Date of admission: 02/08/2024 Delivery date:02/08/2024 Delivering provider: CLAUDENE, [place]  Date of discharge: 02/08/2024  Admitting diagnosis: Encounter for induction of labor [Z34.90] Intrauterine pregnancy: [redacted]w[redacted]d     Secondary diagnosis:  Principal Problem:   Encounter for induction of labor  Additional problems: A1GDM ***    Discharge diagnosis: Term Pregnancy Delivered                                              Post partum procedures:{Postpartum procedures:23558} Augmentation: AROM, Pitocin , and Cytotec  Complications: None  Hospital course: Induction of Labor With Vaginal Delivery   24 y.o. yo [phone] at [redacted]w[redacted]d was admitted to the hospital 02/08/2024 for induction of labor.  Indication for induction: A1 DM.  Patient had an labor course that was uncomplicated Membrane Rupture Time/Date: 10:22 AM,02/08/2024  Delivery Method:Vaginal, Spontaneous Operative Delivery:N/A Episiotomy: None Lacerations:  None Details of delivery can be found in separate delivery note.  Patient had a postpartum course complicated by***. Patient is discharged home 02/08/24.  Newborn Data: Birth date:02/08/2024 Birth time:2:45 PM Gender:Female Living status:Living Apgars:8 ,9  Weight:3120 g  Magnesium  Sulfate received: No BMZ received: No Rhophylac:N/A MMR:N/A T-DaP:Given prenatally Flu: No RSV Vaccine received: No Transfusion:{Transfusion received:30440034}  Immunizations received: Immunization History  Administered Date(s) Administered   HPV Bivalent 01/11/2011   HPV Quadrivalent 08/04/2011   Hepatitis A 12/24/2006   Influenza Split 01/11/2011, 01/25/2012   Influenza Whole 12/24/2006   Influenza,inj,Quad PF,6+ Mos 01/27/2013, 02/17/2014, 02/18/2015, 11/09/2016, 09/17/2017, 11/24/2019, 01/10/2021, 10/18/2021   Meningococcal polysaccharide vaccine (MPSV4)  02/18/2015   Tdap 08/08/2017, 01/08/2020, 12/13/2023   Varicella 12/24/2006    Physical exam  Vitals:   02/08/24 1531 02/08/24 1547 02/08/24 1601 02/08/24 1655  BP: 135/65 (!) 119/56 (!) 118/57 106/61  Pulse: 62 (!) 59 (!) 58 (!) 59  Resp:    18  Temp:    97.7 F (36.5 C)  TempSrc:    Oral  SpO2:    100%  Weight:      Height:       General: {Exam; general:21111117} Lochia: {Desc; appropriate/inappropriate:30686::appropriate} Uterine Fundus: {Desc; firm/soft:30687} Incision: {Exam; incision:21111123} DVT Evaluation: {Exam; dvt:2111122} Labs: Lab Results  Component Value Date   WBC 11.2 (H) 02/08/2024   HGB 11.3 (L) 02/08/2024   HCT 33.5 (L) 02/08/2024   MCV 87.2 02/08/2024   PLT 293 02/08/2024      Latest Ref Rng & Units 02/08/2024    4:27 AM  CMP  Glucose 70 - 99 mg/dL 898   BUN 6 - 20 mg/dL 6   Creatinine 9.55 - 8.99 mg/dL 9.32   Sodium 864 - 854 mmol/L 134   Potassium 3.5 - 5.1 mmol/L 4.1   Chloride 98 - 111 mmol/L 103   CO2 22 - 32 mmol/L 18   Calcium 8.9 - 10.3 mg/dL 9.6   Total Protein 6.5 - 8.1 g/dL 7.1   Total Bilirubin 0.0 - 1.2 mg/dL 0.2   Alkaline Phos 38 - 126 U/L 82   AST 15 - 41 U/L 25   ALT 0 - 44 U/L 12    Edinburgh Score:    04/27/2020    3:23 PM  Edinburgh Postnatal Depression Scale Screening Tool  I have been able to laugh and see  the funny side of things. 0   I have looked forward with enjoyment to things. 1   I have blamed myself unnecessarily when things went wrong. 0   I have been anxious or worried for no good reason. 0   I have felt scared or panicky for no good reason. 2   Things have been getting on top of me. 2   I have been so unhappy that I have had difficulty sleeping. 0   I have felt sad or miserable. 0   I have been so unhappy that I have been crying. 0   The thought of harming myself has occurred to me. 0   Edinburgh Postnatal Depression Scale Total 5      Data saved with a previous flowsheet row definition   No data  recorded  After visit meds:  Allergies as of 02/08/2024       Reactions   Latex Itching     Med Rec must be completed prior to using this [facility]***        Discharge home in stable condition Infant Feeding: {Baby feeding:23562} Infant Disposition:{CHL IP OB HOME WITH FNUYZM:76418} Discharge instruction: per After Visit Summary and Postpartum booklet. Activity: Advance as tolerated. Pelvic rest for 6 weeks.  Diet: {OB ipzu:78888878} Future Appointments:No future appointments.  Follow up Visit:  Please schedule this patient for a In person postpartum visit in 6 weeks with the following provider: Any provider. Additional Postpartum F/U:2 hour GTT at 6 week visit High risk pregnancy complicated by: GDM Delivery mode:  Vaginal, Spontaneous Anticipated Birth Control:  OCPs  Msg sent to [facility] on 2/6  02/08/2024 Lisaann Atha Alena Morrison, MD    "

## 2024-02-08 NOTE — Plan of Care (Signed)

## 2024-02-08 NOTE — Anesthesia Procedure Notes (Signed)
 Epidural Patient location during procedure: OB Start time: 02/08/2024 8:59 AM End time: 02/08/2024 9:02 AM  Staffing Anesthesiologist: Paul Lamarr BRAVO, MD Performed: anesthesiologist   Preanesthetic Checklist Completed: patient identified, IV checked, risks and benefits discussed, monitors and equipment checked, pre-op evaluation and timeout performed  Epidural Patient position: sitting Prep: DuraPrep and site prepped and draped Patient monitoring: continuous pulse ox, blood pressure and heart rate Approach: midline Location: L3-L4 Injection technique: LOR air  Needle:  Needle type: Tuohy  Needle gauge: 17 G Needle length: 9 cm Needle insertion depth: 6 cm Catheter type: closed end flexible Catheter size: 19 Gauge Catheter at skin depth: 11 cm Test dose: negative and Other (1% lidocaine )  Assessment Events: blood not aspirated, no cerebrospinal fluid, injection not painful, no injection resistance, no paresthesia and negative IV test  Additional Notes Patient identified. Risks, benefits, and alternatives discussed with patient including but not limited to bleeding, infection, nerve damage, paralysis, failed block, incomplete pain control, headache, blood pressure changes, nausea, vomiting, reactions to medication, itching, and postpartum back pain. Confirmed with bedside nurse the patient's most recent platelet count. Confirmed with patient that they are not currently taking any anticoagulation, have any bleeding history, or any family history of bleeding disorders. Patient expressed understanding and wished to proceed. All questions were answered. Sterile technique was used throughout the entire procedure. Please see nursing notes for vital signs.   Crisp LOR on first pass. Test dose was given through epidural catheter and negative prior to continuing to dose epidural or start infusion. Warning signs of high block given to the patient including shortness of breath, tingling/numbness  in hands, complete motor block, or any concerning symptoms with instructions to call for help. Patient was given instructions on fall risk and not to get out of bed. All questions and concerns addressed with instructions to call with any issues or inadequate analgesia.  Reason for block:procedure for pain

## 2024-02-08 NOTE — H&P (Signed)
 OBSTETRIC ADMISSION HISTORY AND PHYSICAL  Kristen Dean is a 24 y.o. female [phone] with IUP at [redacted]w[redacted]d by 6wk US  presenting for IOL for GDMA1. She reports +FMs, No LOF, no VB, no blurry vision, headaches or peripheral edema, and RUQ pain.  She plans on bottle feeding. She request OCPs for birth control. 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   Generalized abdominal pain 08/04/2020   GERD (gastroesophageal reflux disease)    Learning disability 01/27/2013   Lower abdominal pain 02/14/2022   Tension headache 06/16/2020   Vitamin D  deficiency 02/21/2015    Past Surgical History: Past Surgical History:  Procedure Laterality Date   WISDOM TOOTH EXTRACTION      Obstetrical History: OB History     Gravida  5   Para  2   Term  2   Preterm      AB  2   Living  2      SAB  2   IAB      Ectopic      Multiple  0   Live Births  2        Obstetric Comments  Pregnancy #2 Baby Boy, Vit D Deficiency, Fetal Pyelectasis that resolved         Social History Social History   Socioeconomic History   Marital status: Single    Spouse name: Not on file   Number of children: Not on file   Years of education: Not on file   Highest education level: Not on file  Occupational History   Not on file  Tobacco Use   Smoking status: Never   Smokeless tobacco: Never  Vaping Use   Vaping status: Never Used  Substance and Sexual Activity   Alcohol use: Not Currently   Drug use: No   Sexual activity: Not Currently    Birth control/protection: None  Other Topics Concern   Not on file  Social  History Narrative   Not on file   Social Drivers of Health   Tobacco Use: Low Risk (02/04/2024)   Patient History    Smoking Tobacco Use: Never    Smokeless Tobacco Use: Never    Passive Exposure: Not on file  Financial Resource Strain: Not on file  Food Insecurity: No Food Insecurity (11/28/2023)   Epic    Worried About Programme Researcher, Broadcasting/film/video in the Last Year: Never true    [employer] of Food in the Last Year: Never true  Transportation Needs: No Transportation Needs (11/02/2023)   Epic    Lack of Transportation (Medical): No    Lack of Transportation (Non-Medical): No  Physical Activity: Not on file  Stress: Not on file  Social Connections: Not on file  Depression (EYV7-0): Low Risk (11/28/2023)   Depression (PHQ2-9)    PHQ-2 Score: 0  Alcohol Screen: Not on file  Housing: Unknown (11/02/2023)   Epic    Unable to Pay for Housing in the Last Year: No    Number of Times Moved in the Last Year: Not on file    Homeless in the Last Year: No  Utilities: Not At  Risk (11/02/2023)   Epic    Threatened with loss of utilities: No  Health Literacy: Not on file    Family History: Family History  Problem Relation Age of Onset   Diabetes Mother    Hypertension Mother    Diabetes Sister    Mental retardation Sister    Heart disease Maternal Aunt    Diabetes Maternal Aunt    Diabetes Maternal Uncle    Diabetes Maternal Grandmother    Cancer Maternal Grandfather    Heart disease Maternal Grandfather    Diabetes Maternal Grandfather    Cancer Other    Colon cancer Neg Hx    Esophageal cancer Neg Hx    Pancreatic cancer Neg Hx    Stomach cancer Neg Hx    Asthma Neg Hx     Allergies: Allergies[1]  Medications Prior to Admission  Medication Sig Dispense Refill Last Dose/Taking   Accu-Chek Softclix Lancets lancets Use 4 times daily as instructed 100 each 12    aspirin  81 MG chewable tablet Chew 1 tablet (81 mg total) by mouth daily. 90 tablet 3    Blood Glucose Monitoring Suppl  (ACCU-CHEK GUIDE) w/Device KIT 1 Device by Does not apply route in the morning, at noon, in the evening, and at bedtime. Dx: O24.419 1 kit 0    Blood Pressure Monitoring (BLOOD PRESSURE KIT) DEVI 1 each by Does not apply route as needed. 1 each 0    famotidine  (PEPCID ) 20 MG tablet Take 1 tablet (20 mg total) by mouth 2 (two) times daily as needed for heartburn or indigestion. 30 tablet 2    ferrous sulfate  325 (65 FE) MG tablet Take 1 tablet (325 mg total) by mouth every other day. Take on M, W, F 45 tablet 3    glucose blood test strip Use 4 times daily as instructed 100 each 12    mupirocin  ointment (BACTROBAN ) 2 % Apply 1 Application topically 2 (two) times daily. 22 g 0    ondansetron  (ZOFRAN -ODT) 4 MG disintegrating tablet Take 1 tablet (4 mg total) by mouth every 6 (six) hours as needed for nausea. 42 tablet 3    pantoprazole  (PROTONIX ) 40 MG tablet Take 1 tablet (40 mg total) by mouth daily. (Patient not taking: Reported on 02/06/2024) 30 tablet 0    Prenatal Vit-Fe Fumarate-FA (PREPLUS) 27-1 MG TABS Take 1 tablet by mouth daily. 30 tablet 13    scopolamine  (TRANSDERM-SCOP) 1 MG/3DAYS Place 1 patch (1 mg total) onto the skin every 3 (three) days. 10 patch 0    sodium chloride  (OCEAN) 0.65 % SOLN nasal spray Place 1 spray into both nostrils as needed for congestion.        Review of Systems   All systems reviewed and negative except as stated in HPI  Height 5' 8 (1.727 m), weight 107.8 kg, last menstrual period 04/07/2023. General appearance: alert, cooperative, appears stated age, and no distress Lungs: clear to auscultation bilaterally Heart: regular rate and rhythm Abdomen: soft, non-tender; bowel sounds normal Extremities: Homans sign is negative, no sign of DVT Presentation: cephalic Fetal monitoringBaseline: 140 bpm, Variability: Good {> 6 bpm), Accelerations: Reactive, and Decelerations: Absent Uterine activityNone     Prenatal labs: ABO, Rh: A/Positive/-- (06/12  1427) Antibody: Negative (06/12 1427) Rubella: 9.36 (06/12 1427) RPR: Non Reactive (11/18 0837)  HBsAg: Negative (06/12 1427)  HIV: Non Reactive (11/18 0837)  GBS: Negative/-- (01/20 1357)    Lab Results  Component Value Date   GBS Negative 01/22/2024  GTT abnormal 2 hour Genetic screening  low-risk Anatomy US  normal  Immunization History  Administered Date(s) Administered   HPV Bivalent 01/11/2011   HPV Quadrivalent 08/04/2011   Hepatitis A 12/24/2006   Influenza Split 01/11/2011, 01/25/2012   Influenza Whole 12/24/2006   Influenza,inj,Quad PF,6+ Mos 01/27/2013, 02/17/2014, 02/18/2015, 11/09/2016, 09/17/2017, 11/24/2019, 01/10/2021, 10/18/2021   Meningococcal polysaccharide vaccine (MPSV4) 02/18/2015   Tdap 08/08/2017, 01/08/2020, 12/13/2023   Varicella 12/24/2006    Prenatal Transfer Tool  Maternal Diabetes: Yes:  Diabetes Type:  Diet controlled Genetic Screening: Normal Maternal Ultrasounds/Referrals: Normal Fetal Ultrasounds or other Referrals:  None Maternal Substance Abuse:  No Significant Maternal Medications:  None Significant Maternal Lab Results: Group B Strep negative Number of Prenatal Visits:greater than 3 verified prenatal visits Maternal Vaccinations:TDap Other Comments:  None   No results found for this or any previous visit (from the past 24 hours).  Patient Active Problem List   Diagnosis Date Noted   Gestational diabetes, diet controlled 12/03/2023   Poor fetal growth affecting management of mother in third trimester 12/03/2023   Obesity affecting pregnancy, antepartum 10/26/2023   Supervision of high risk pregnancy, antepartum 07/24/2023   Nausea and vomiting during pregnancy 07/17/2019   Gastroesophageal reflux during pregnancy in third trimester, antepartum 08/08/2017   Vaginal irritation 11/09/2016    Assessment/Plan:  Kristen Dean is a 24 y.o. [id number] at [redacted]w[redacted]d here for IOL  #Labor: IOL process explained in detail, will start with  dual cytotec , would like to avoid foley balloon #Pain: Per patient preference, encourage ambulation #FWB: Category I #GBS status:  negative #Feeding: Formula #Reproductive Life planning: Combination OCPs #Circ:  not applicable  #GDMA1: POCT glucose checks q4h in latent labor, q2h in active labor  [name], MD  02/08/2024, 4:01 AM       [1]  Allergies Allergen Reactions   Latex Itching

## 2024-02-08 NOTE — Progress Notes (Signed)
 Kristen Dean is a 24 y.o. [id number] at [redacted]w[redacted]d.  Subjective: Comfortable with epidural  Objective: BP 100/70   Pulse (!) 102   Temp (!) 97.5 F (36.4 C) (Oral)   Resp 18   Ht 5' 8 (1.727 m)   Wt 107.8 kg   LMP 04/07/2023 Comment: reports miscarriage 05/11/23  SpO2 99%   BMI 36.13 kg/m    FHT:  FHR: 135 bpm, variability: Moderate,  accelerations: 10 X15,  decelerations: Few variable decelerations that resolved with position change UC:   Q 2-3 minutes, moderate Dilation: 6 Effacement (%): 60 Cervical Position: Posterior Station: -1 Presentation: Vertex Exam by:: Julienne Sharps, CNM AROM large amount of light meconium stained fluid.  No cord palpated.   Labs: Results for orders placed or performed during the hospital encounter of 02/08/24 (from the past 24 hours)  CBC     Status: Abnormal   Collection Time: 02/08/24  4:27 AM  Result Value Ref Range   WBC 11.2 (H) 4.0 - 10.5 K/uL   RBC 3.84 (L) 3.87 - 5.11 MIL/uL   Hemoglobin 11.3 (L) 12.0 - 15.0 g/dL   HCT 66.4 (L) 63.9 - 53.9 %   MCV 87.2 80.0 - 100.0 fL   MCH 29.4 26.0 - 34.0 pg   MCHC 33.7 30.0 - 36.0 g/dL   RDW 85.4 88.4 - 84.4 %   Platelets 293 150 - 400 K/uL   nRBC 0.0 0.0 - 0.2 %  RPR     Status: None   Collection Time: 02/08/24  4:27 AM  Result Value Ref Range   RPR Ser Ql NON REACTIVE NON REACTIVE  Comprehensive metabolic panel     Status: Abnormal   Collection Time: 02/08/24  4:27 AM  Result Value Ref Range   Sodium 134 (L) 135 - 145 mmol/L   Potassium 4.1 3.5 - 5.1 mmol/L   Chloride 103 98 - 111 mmol/L   CO2 18 (L) 22 - 32 mmol/L   Glucose, Bld 101 (H) 70 - 99 mg/dL   BUN 6 6 - 20 mg/dL   Creatinine, Ser 9.32 0.44 - 1.00 mg/dL   Calcium 9.6 8.9 - 89.6 mg/dL   Total Protein 7.1 6.5 - 8.1 g/dL   Albumin 3.6 3.5 - 5.0 g/dL   AST 25 15 - 41 U/L   ALT 12 0 - 44 U/L   Alkaline Phosphatase 82 38 - 126 U/L   Total Bilirubin 0.2 0.0 - 1.2 mg/dL   GFR, Estimated >39 >39 mL/min   Anion gap 13 5 - 15   Type and screen [facility]     Status: None   Collection Time: 02/08/24  4:30 AM  Result Value Ref Range   ABO/RH(D) A POS    Antibody Screen NEG    Sample Expiration      02/11/2024,2359 Performed at [facility], [address], [place], [place] [address]   Glucose, capillary     Status: None   Collection Time: 02/08/24  5:21 AM  Result Value Ref Range   Glucose-Capillary 97 70 - 99 mg/dL  Glucose, capillary     Status: None   Collection Time: 02/08/24  9:27 AM  Result Value Ref Range   Glucose-Capillary 92 70 - 99 mg/dL    Assessment / Plan: [redacted]w[redacted]d week IUP ROM x Minutes: 48 Labor: IOL for GDM.  Progressing well after Cytotec  and AROM.  Will see how contractions do with AROM.  Consider Pitocin  as needed if contractions  spread out. Fetal Wellbeing:  Category I-II, overall category 1.  Continue to observe closely.  Consider amnioinfusion if variables recur.  Attending updated. Pain Control: Epidural Anticipated MOD: SVD A1 GDM: CBGs stable off meds.  Kristen Dean, Kristen Dean , CNM 02/08/2024 11:10 AM

## 2024-02-12 ENCOUNTER — Encounter: Payer: MEDICAID | Admitting: Obstetrics and Gynecology

## 2024-02-19 ENCOUNTER — Encounter: Payer: MEDICAID | Admitting: Certified Nurse Midwife
